# Patient Record
Sex: Female | Born: 1955 | Race: White | Hispanic: No | Marital: Married | Smoking: Never smoker
Health system: Southern US, Academic
[De-identification: ages and names within clinical notes are randomized; demographics above are authoritative.]

## PROBLEM LIST (undated history)

## (undated) DIAGNOSIS — N83209 Unspecified ovarian cyst, unspecified side: Secondary | ICD-10-CM

## (undated) DIAGNOSIS — K589 Irritable bowel syndrome without diarrhea: Secondary | ICD-10-CM

## (undated) DIAGNOSIS — F411 Generalized anxiety disorder: Secondary | ICD-10-CM

## (undated) DIAGNOSIS — G4700 Insomnia, unspecified: Secondary | ICD-10-CM

## (undated) DIAGNOSIS — F329 Major depressive disorder, single episode, unspecified: Secondary | ICD-10-CM

## (undated) DIAGNOSIS — I10 Essential (primary) hypertension: Secondary | ICD-10-CM

## (undated) DIAGNOSIS — R918 Other nonspecific abnormal finding of lung field: Secondary | ICD-10-CM

## (undated) DIAGNOSIS — K859 Acute pancreatitis without necrosis or infection, unspecified: Secondary | ICD-10-CM

## (undated) DIAGNOSIS — L570 Actinic keratosis: Secondary | ICD-10-CM

## (undated) DIAGNOSIS — E041 Nontoxic single thyroid nodule: Secondary | ICD-10-CM

## (undated) DIAGNOSIS — J8283 Eosinophilic asthma: Secondary | ICD-10-CM

## (undated) DIAGNOSIS — E782 Mixed hyperlipidemia: Secondary | ICD-10-CM

## (undated) DIAGNOSIS — G8929 Other chronic pain: Secondary | ICD-10-CM

## (undated) DIAGNOSIS — M797 Fibromyalgia: Secondary | ICD-10-CM

## (undated) DIAGNOSIS — C44622 Squamous cell carcinoma of skin of right upper limb, including shoulder: Secondary | ICD-10-CM

## (undated) DIAGNOSIS — I749 Embolism and thrombosis of unspecified artery: Secondary | ICD-10-CM

## (undated) DIAGNOSIS — D68318 Other hemorrhagic disorder due to intrinsic circulating anticoagulants, antibodies, or inhibitors: Secondary | ICD-10-CM

## (undated) DIAGNOSIS — G44309 Post-traumatic headache, unspecified, not intractable: Secondary | ICD-10-CM

## (undated) DIAGNOSIS — K55069 Acute infarction of intestine, part and extent unspecified: Secondary | ICD-10-CM

## (undated) DIAGNOSIS — E039 Hypothyroidism, unspecified: Secondary | ICD-10-CM

## (undated) DIAGNOSIS — M5020 Other cervical disc displacement, unspecified cervical region: Secondary | ICD-10-CM

## (undated) DIAGNOSIS — R109 Unspecified abdominal pain: Secondary | ICD-10-CM

## (undated) HISTORY — DX: Embolism and thrombosis of unspecified artery: I74.9

## (undated) HISTORY — DX: Unspecified abdominal pain: R10.9

## (undated) HISTORY — PX: SKIN LESION EXCISION: SHX2412

## (undated) HISTORY — DX: Fibromyalgia: M79.7

## (undated) HISTORY — PX: SQUAMOUS CELL CARCINOMA EXCISION: SHX2433

## (undated) HISTORY — DX: Other hemorrhagic disorder due to intrinsic circulating anticoagulants, antibodies, or inhibitors: D68.318

## (undated) HISTORY — DX: Post-traumatic headache, unspecified, not intractable: G44.309

## (undated) HISTORY — DX: Eosinophilic asthma: J82.83

## (undated) HISTORY — PX: HX GALL BLADDER SURGERY/CHOLE: SHX55

## (undated) HISTORY — DX: Insomnia, unspecified: G47.00

## (undated) HISTORY — DX: Squamous cell carcinoma of skin of right upper limb, including shoulder: C44.622

## (undated) HISTORY — DX: Other nonspecific abnormal finding of lung field: R91.8

## (undated) HISTORY — PX: SKIN GRAFT: SHX250

## (undated) HISTORY — DX: Irritable bowel syndrome, unspecified: K58.9

## (undated) HISTORY — PX: NECK SURGERY: SHX720

## (undated) HISTORY — PX: HX TUBAL LIGATION: SHX77

## (undated) HISTORY — PX: HX LASER SKIN LESION REMOVAL: 2100001165

## (undated) HISTORY — PX: BILATERAL OOPHORECTOMY: SHX1221

## (undated) HISTORY — DX: Generalized anxiety disorder: F41.1

## (undated) HISTORY — PX: HX THYROIDECTOMY: SHX17

## (undated) HISTORY — DX: Other cervical disc displacement, unspecified cervical region: M50.20

## (undated) HISTORY — DX: Hypothyroidism, unspecified: E03.9

## (undated) HISTORY — DX: Other chronic pain: G89.29

## (undated) HISTORY — DX: Nontoxic single thyroid nodule: E04.1

## (undated) HISTORY — DX: Unspecified ovarian cyst, unspecified side: N83.209

## (undated) HISTORY — DX: Actinic keratosis: L57.0

## (undated) HISTORY — DX: Essential (primary) hypertension: I10

## (undated) HISTORY — DX: Major depressive disorder, single episode, unspecified: F32.9

## (undated) HISTORY — DX: Acute infarction of intestine, part and extent unspecified: K55.069

## (undated) HISTORY — DX: Acute pancreatitis without necrosis or infection, unspecified: K85.90

## (undated) HISTORY — DX: Mixed hyperlipidemia: E78.2

---

## 1996-02-13 ENCOUNTER — Other Ambulatory Visit (HOSPITAL_COMMUNITY): Payer: Self-pay

## 2015-09-23 HISTORY — PX: COLONOSCOPY: SHX174

## 2019-10-11 IMAGING — CR XRAY CERVICAL SPINE MINIMUM 4 VIEWS
1 series · 5 of 5 positions shown · non-contrast
Comparison: None available.

EXAM:  XRAY CERVICAL SPINE MINIMUM 4 VIEWS
INDICATION: Neck pain, fall.

[Series 1: view not recorded · 0.17mm/px · 5 of 5 slices shown]
[im 1/5]
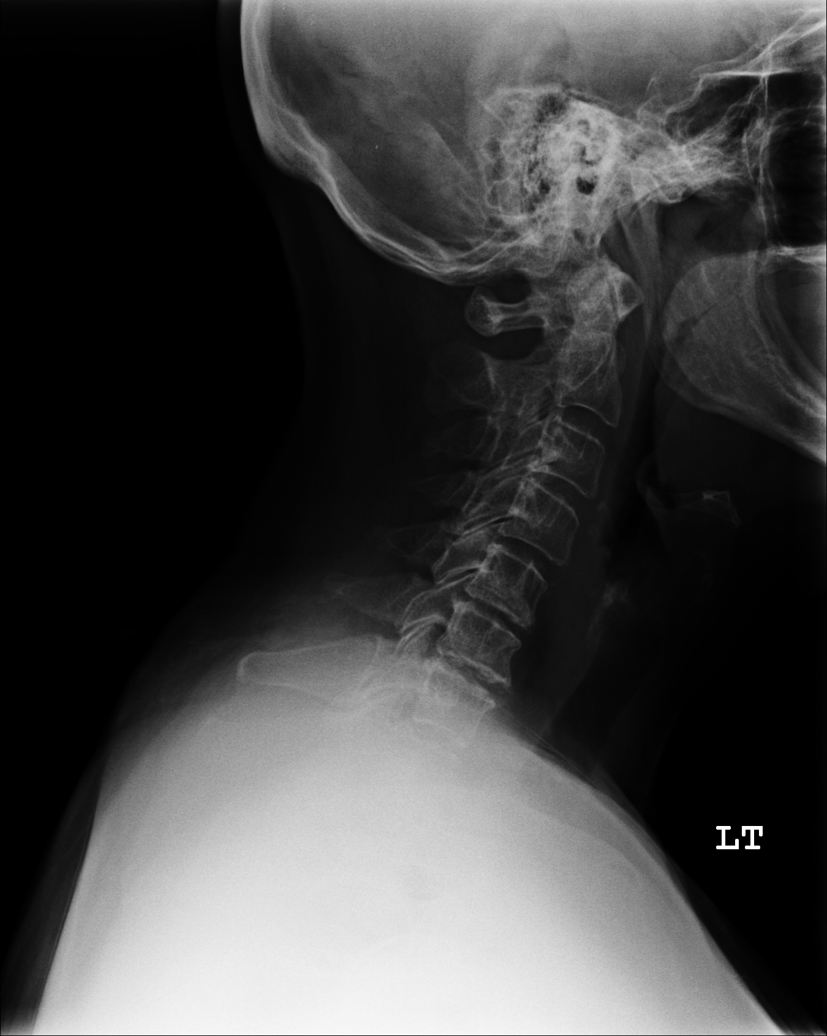
[im 2/5]
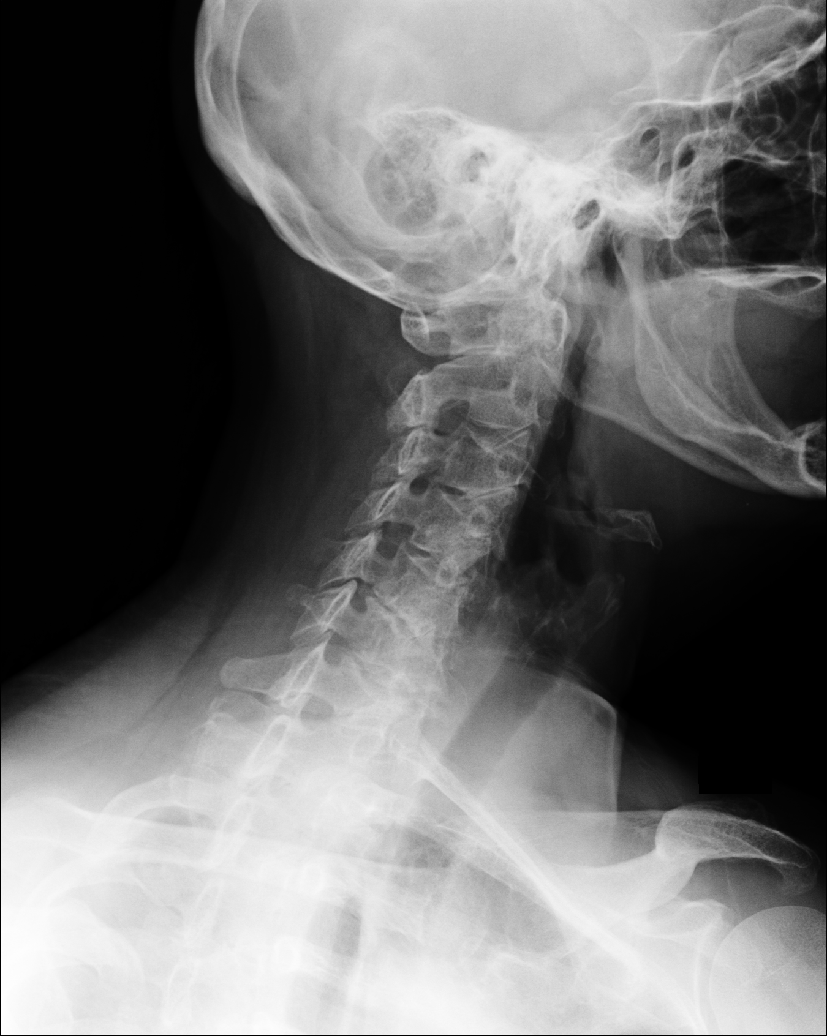
[im 3/5]
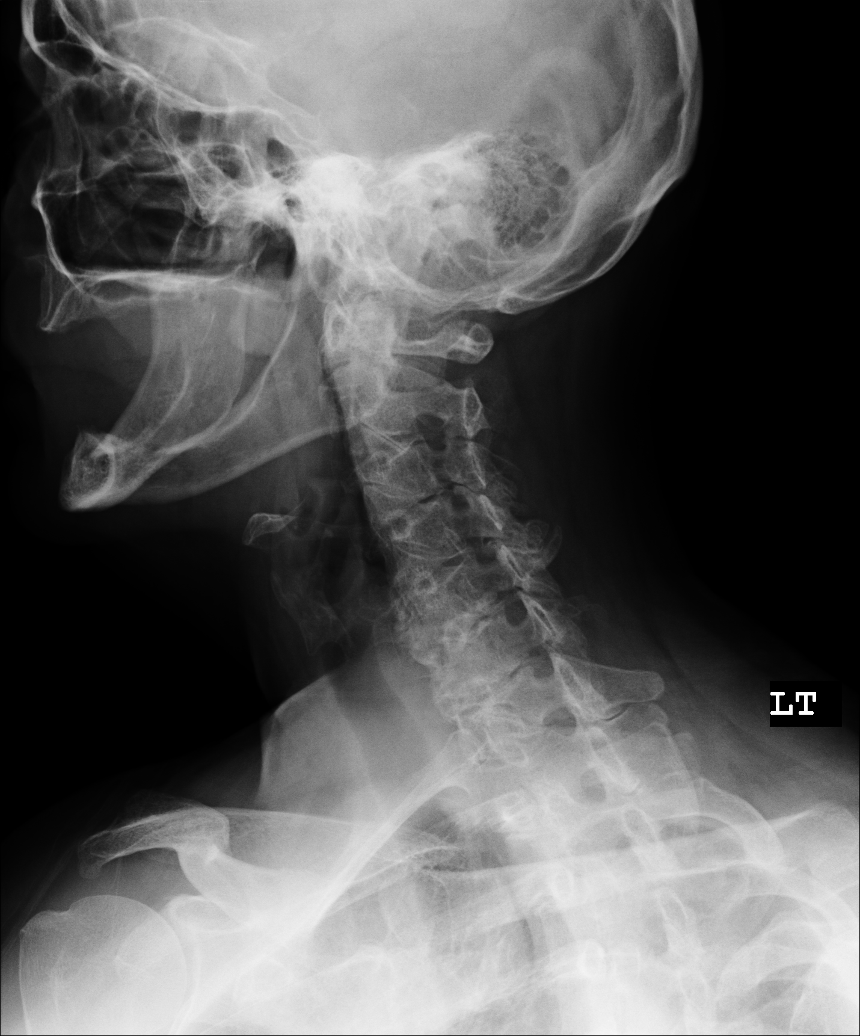
[im 4/5]
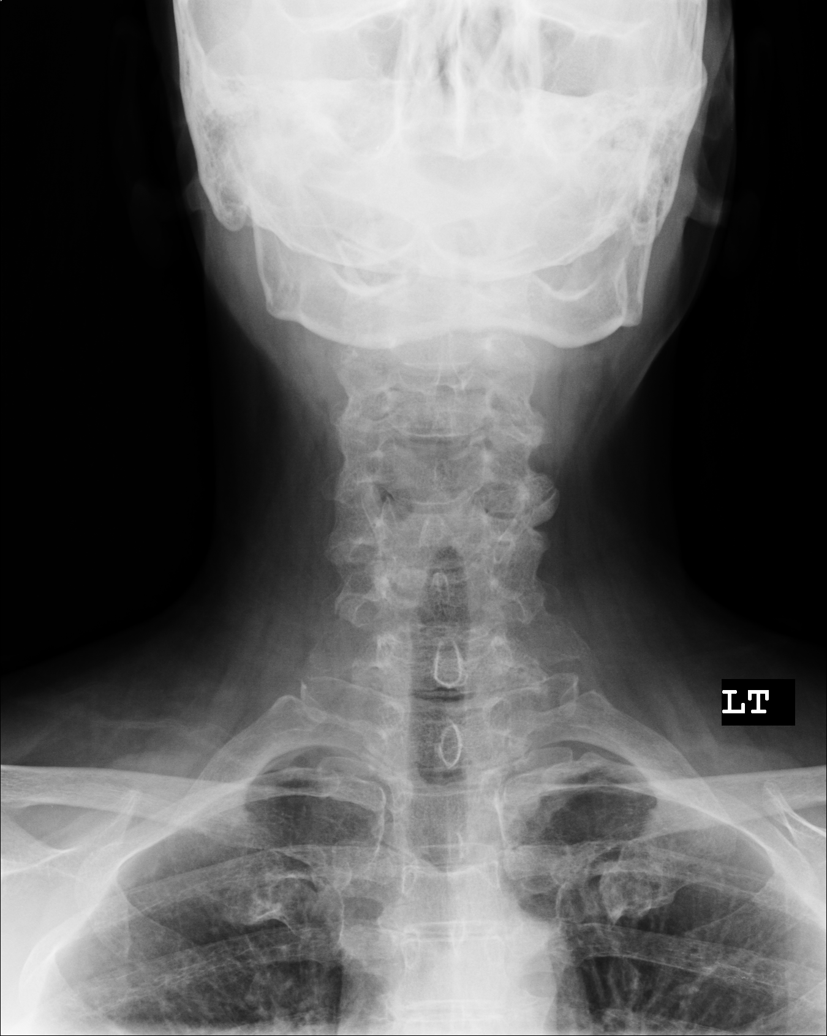
[im 5/5]
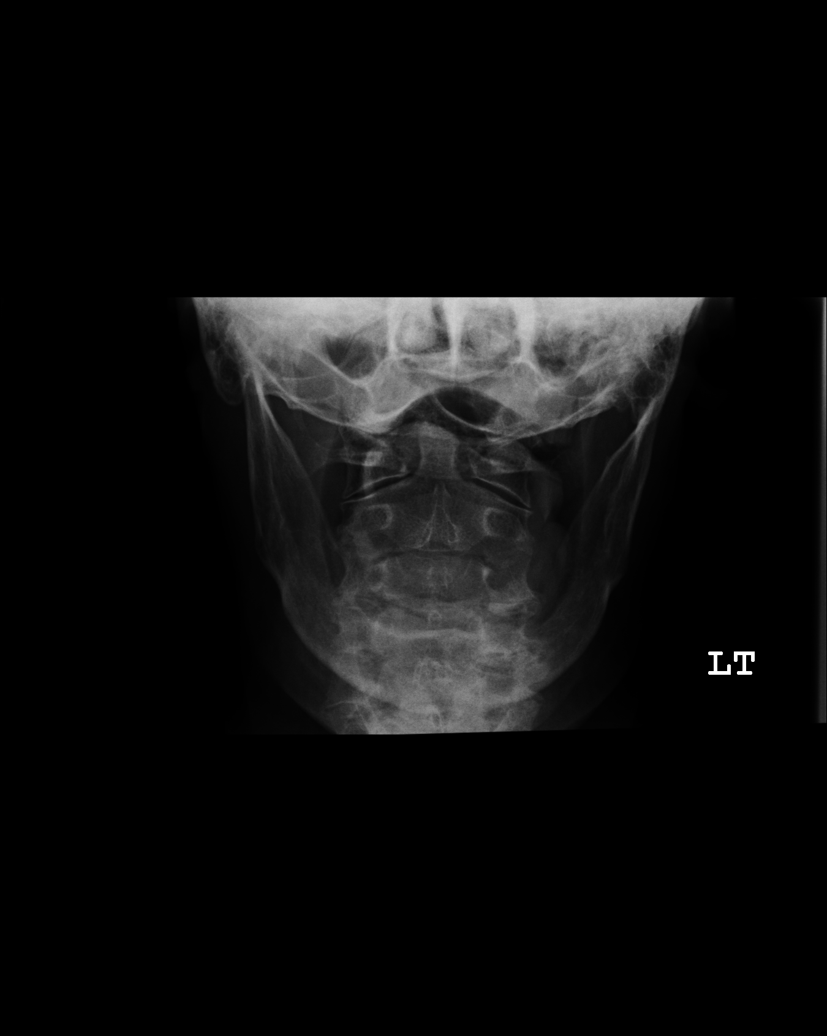

[5 of 5 positions shown; findings below may reference images not displayed]

FINDINGS: Vertebral bodies are normal in height and alignment. There is no acute fracture or subluxation. There is mild degenerative disc disease at C6-7 level. Mild-to-moderate left neural foraminal stenosis is noted at C3-4 and C4-5 levels from facet and uncovertebral joint hypertrophy. Atlantoaxial articulation is well maintained. There is no prevertebral soft tissue swelling.
IMPRESSION: Multilevel degenerative changes as detailed above.

## 2019-10-11 IMAGING — CT CT HEAD/BRAIN W/O DYE
2 series · 16 of 30 positions shown, 20 images · non-contrast
Comparison: None available.

EXAM:  CT HEAD/BRAIN W/O DYE
INDICATION: Headaches and dizziness, recent fall.
TECHNIQUE: Axial noncontrast CT imaging was performed from skull base to the vertex. Exam was performed using 1 or more of the following dose reduction techniques: Automated exposure control, adjustment of the mA and/or kV according to patient size, or the use of iterative reconstruction technique.

[ax · axial · 0.40mm/px · z∈[-406,-272]mm · 13 of 79 slices shown, 17 images]
[im 6/79  brain]
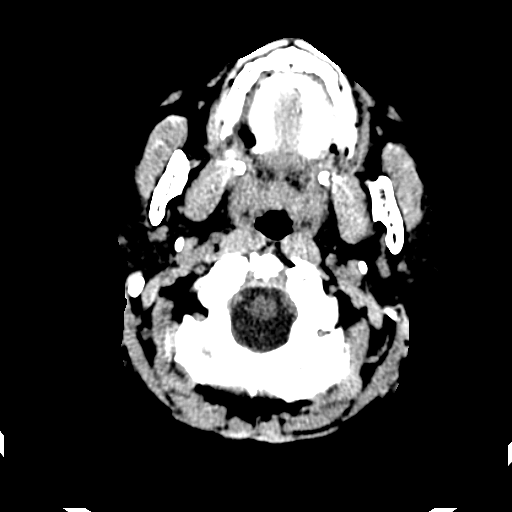
[im 6/79  bone]
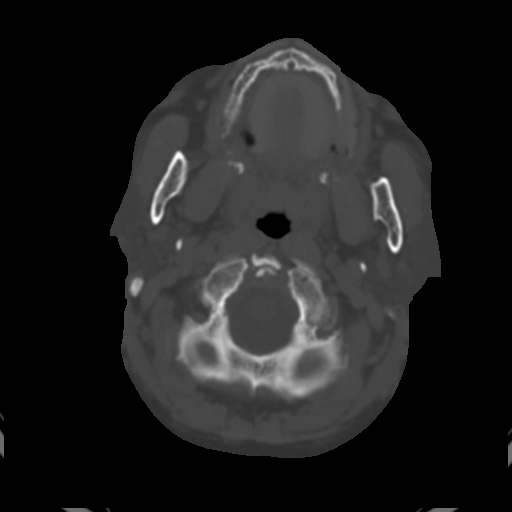
[im 12/79  brain]
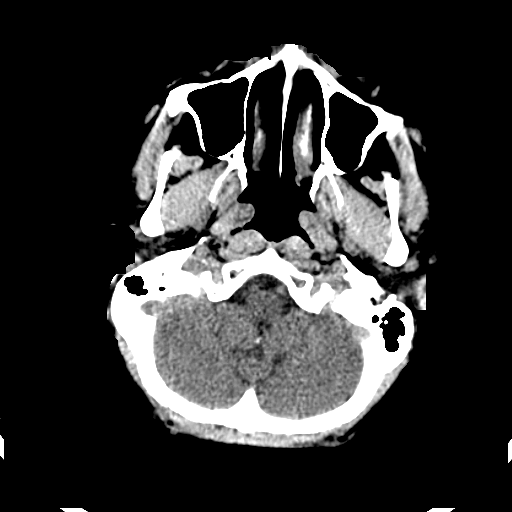
[im 17/79  brain]
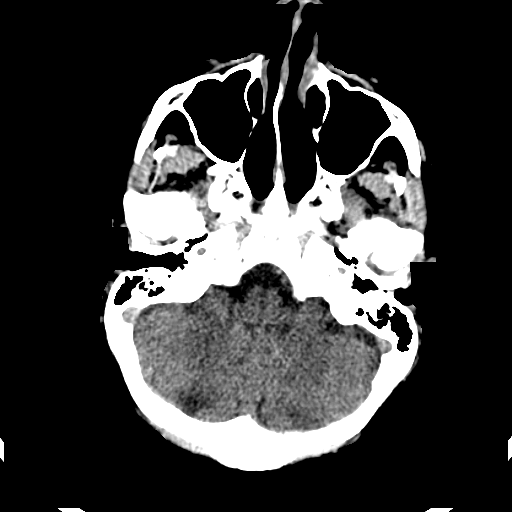
[im 23/79  brain]
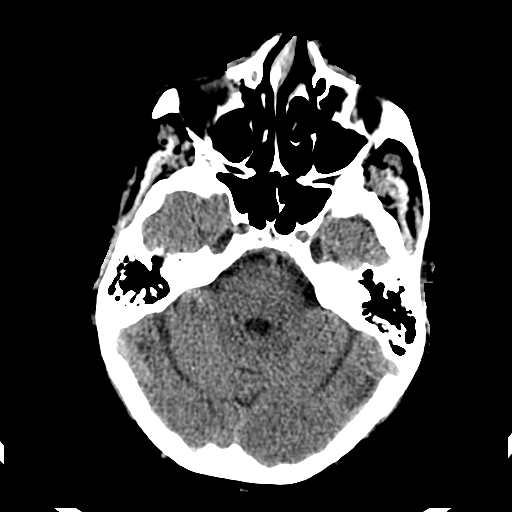
[im 28/79  brain]
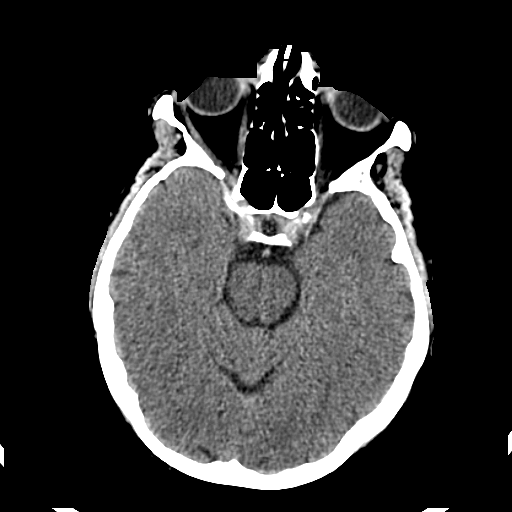
[im 28/79  bone]
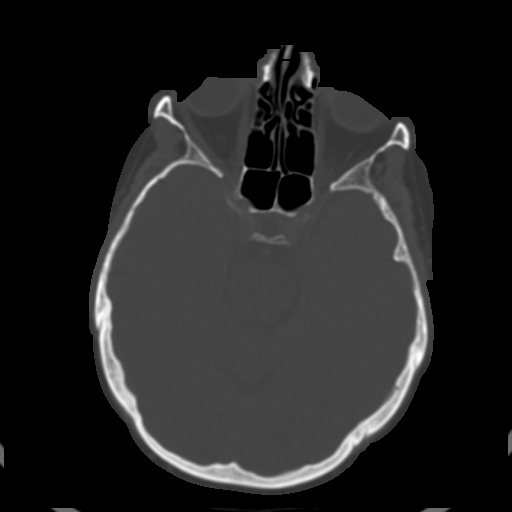
[im 34/79  brain]
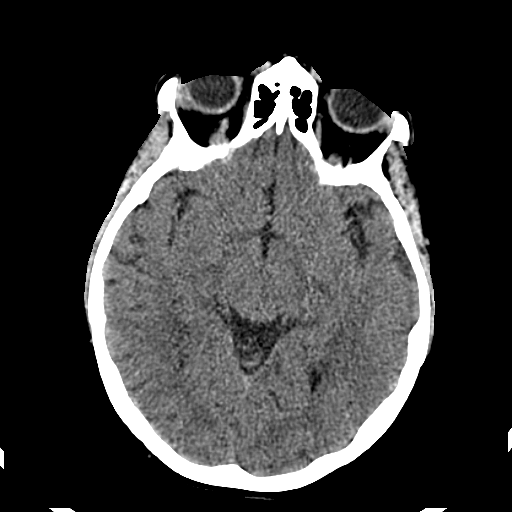
[im 40/79  brain]
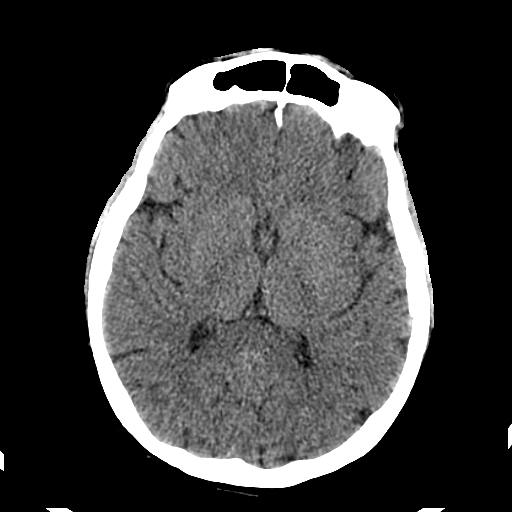
[im 45/79  brain]
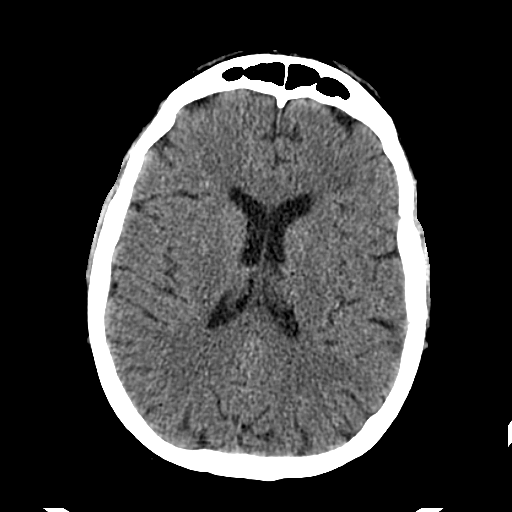
[im 51/79  brain]
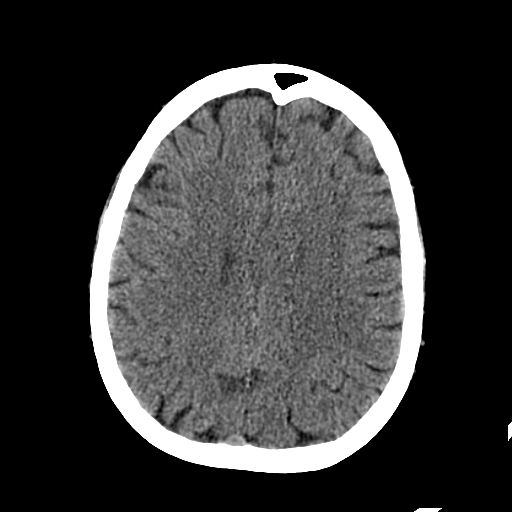
[im 51/79  bone]
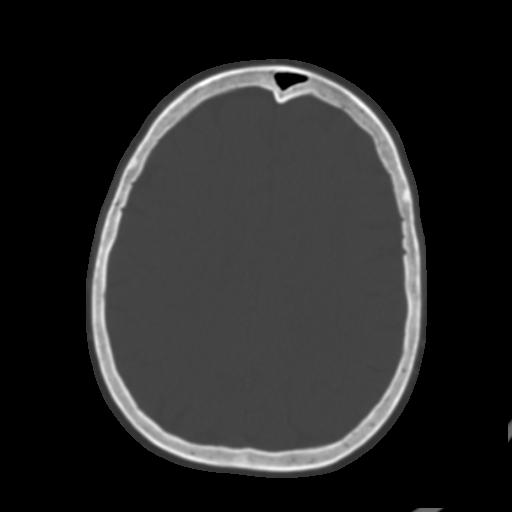
[im 56/79  brain]
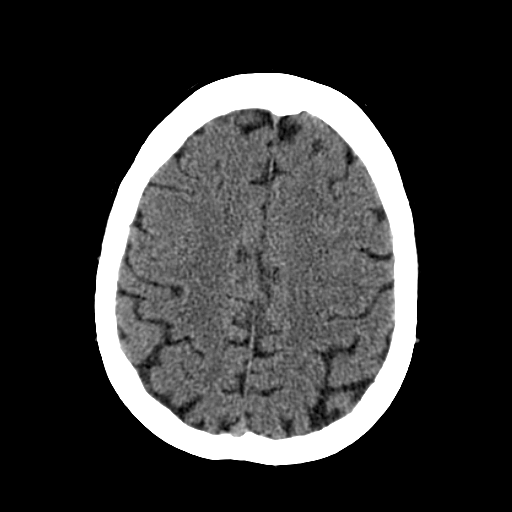
[im 62/79  brain]
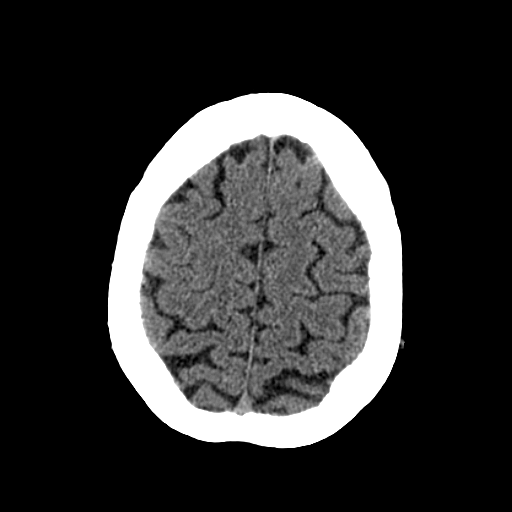
[im 67/79  brain]
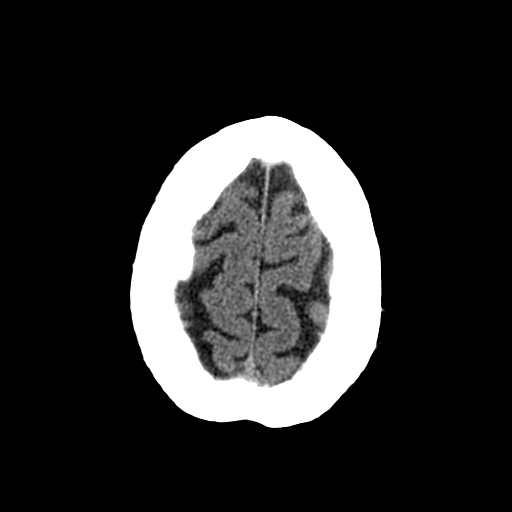
[im 73/79  brain]
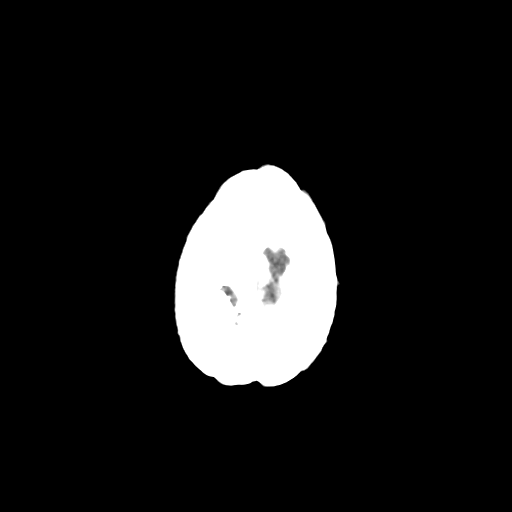
[im 73/79  bone]
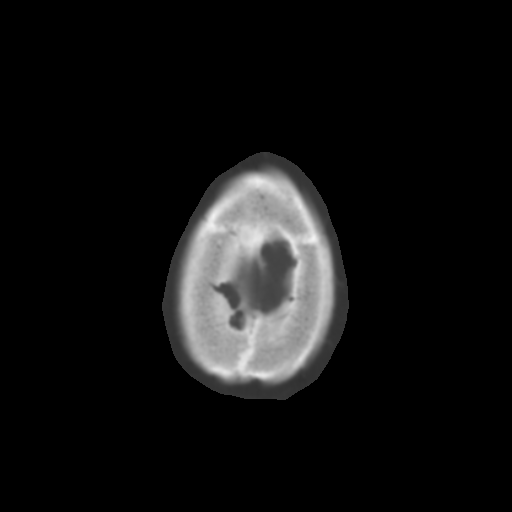

[ax bone · axial · 0.43mm/px · z∈[-400,-358]mm · 3 of 74 slices shown]
[im 6/74  bone]
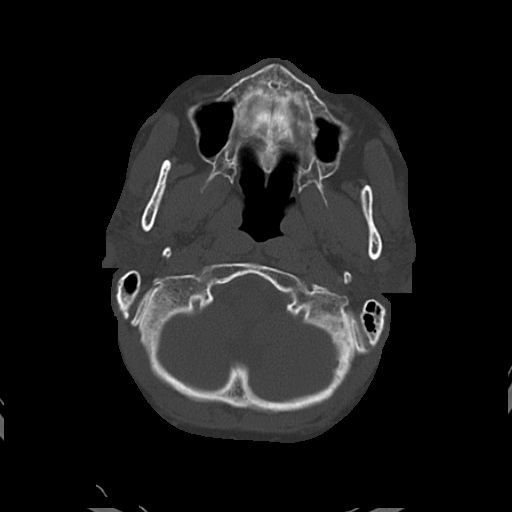
[im 16/74  bone]
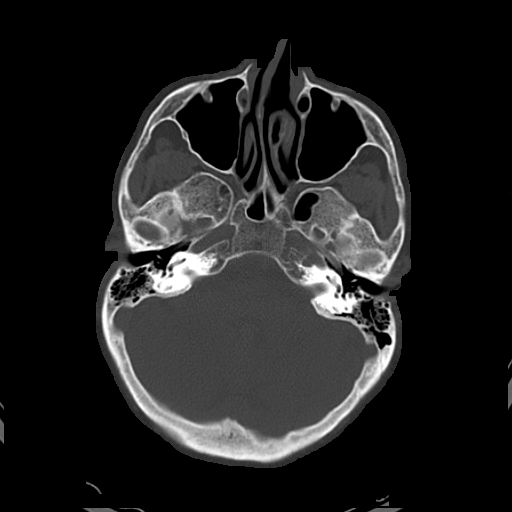
[im 27/74  bone]
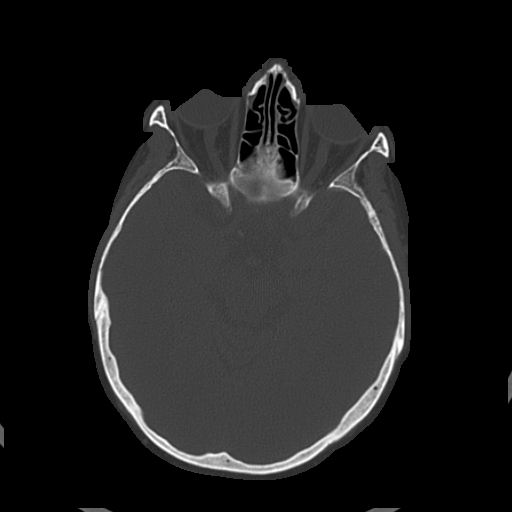

[16 of 30 positions shown; findings below may reference images not displayed]

FINDINGS: The cortical sulci and ventricular system is appropriate in size for the patient's age. There are no focal areas of abnormal attenuation within the brain parenchyma. There is no mass effect, midline shift or intracranial hemorrhage. There is no evidence of acute infarction. There are no extra-axial fluid collections. Visualized paranasal sinuses, mastoid air cells and orbital contents are also unremarkable.
IMPRESSION: Unremarkable exam.

## 2021-02-20 IMAGING — MG 3D SCREENING MAMMO BIL W/CAD & TOMO
5 series · 8 of 24 positions shown · non-contrast
Comparison: 01/05/2021

------------- REPORT [id number] -------------
﻿

MCELRATH, KADE
EXAM:  3D BILATERAL ANNUAL SCREENING DIGITAL MAMMOGRAM WITH CAD AND TOMOSYNTHESIS
INDICATION: Screening.

[R]
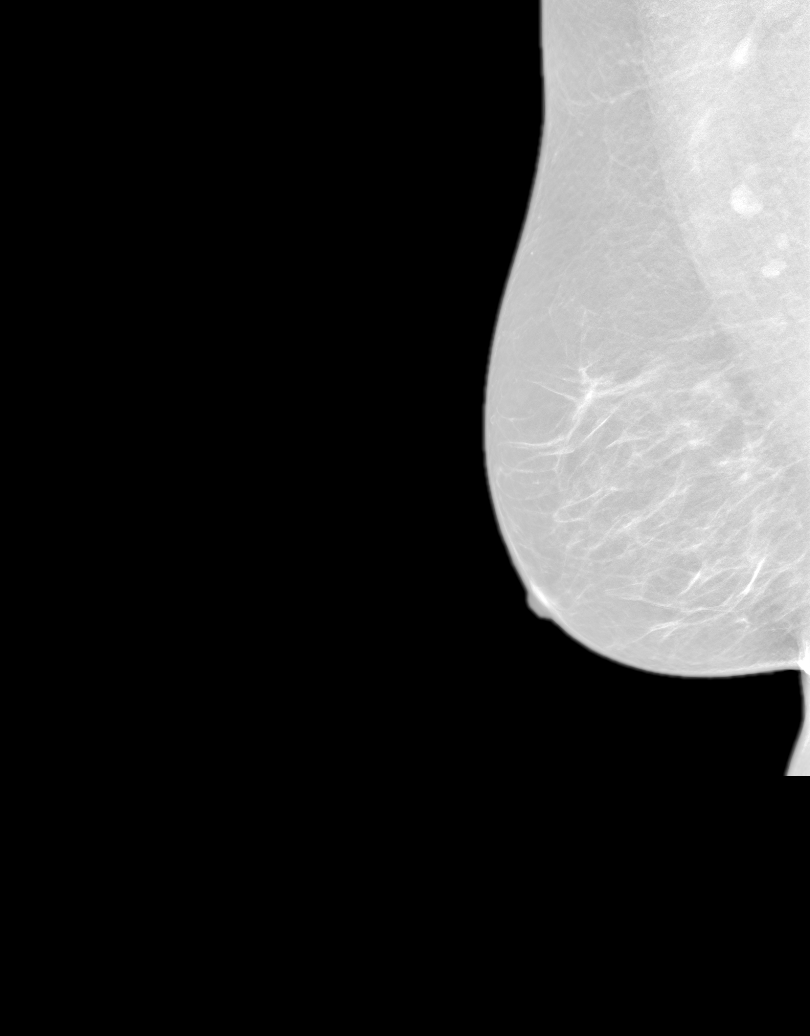

[L]
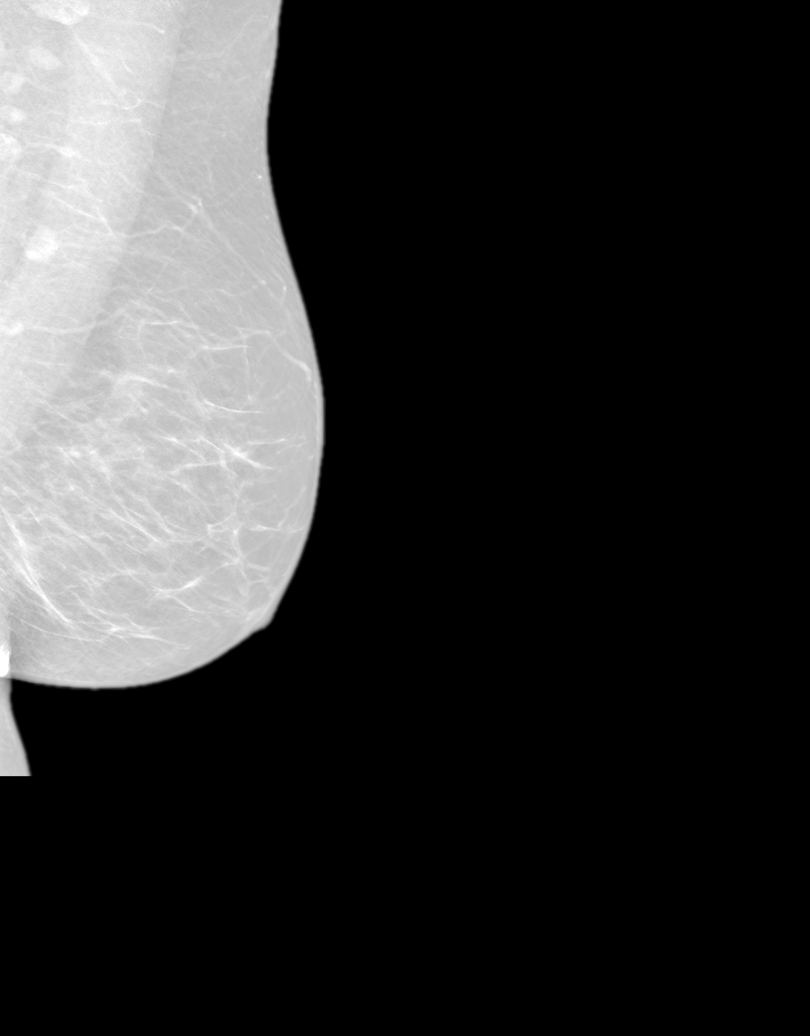

[R CC tomo · right · 0.10mm/px · 2 of 3 slices shown]
[im 1/3]
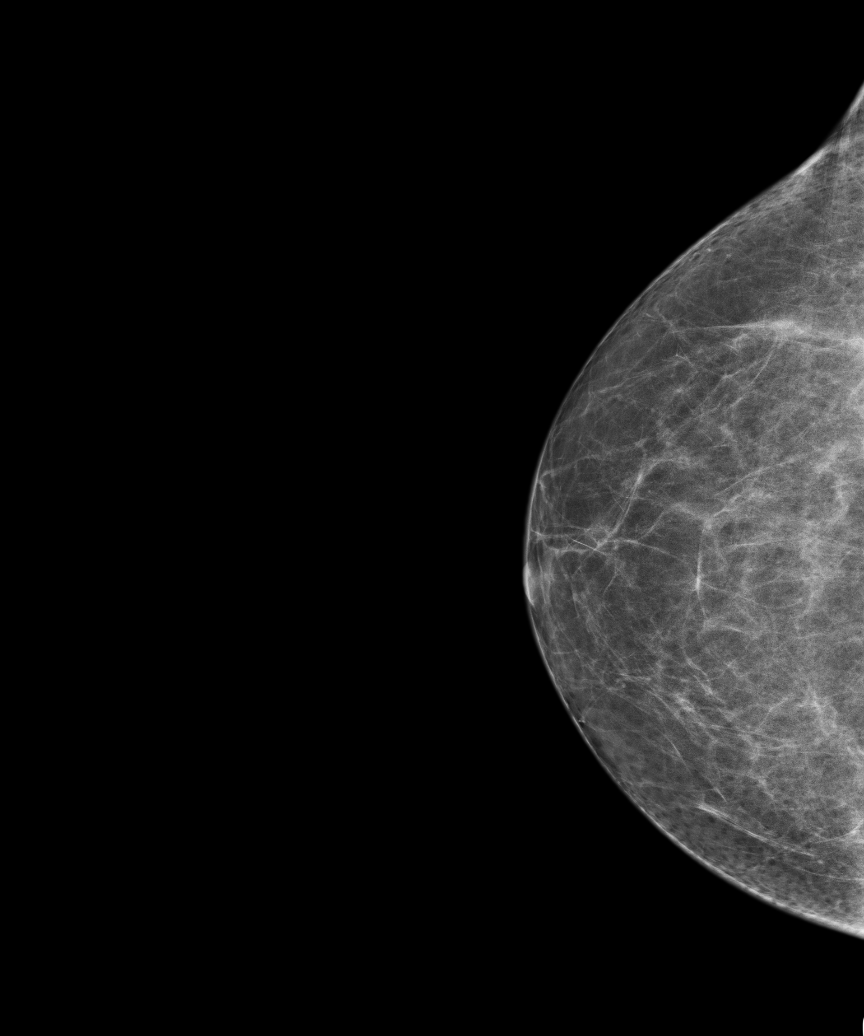
[im 3/3]
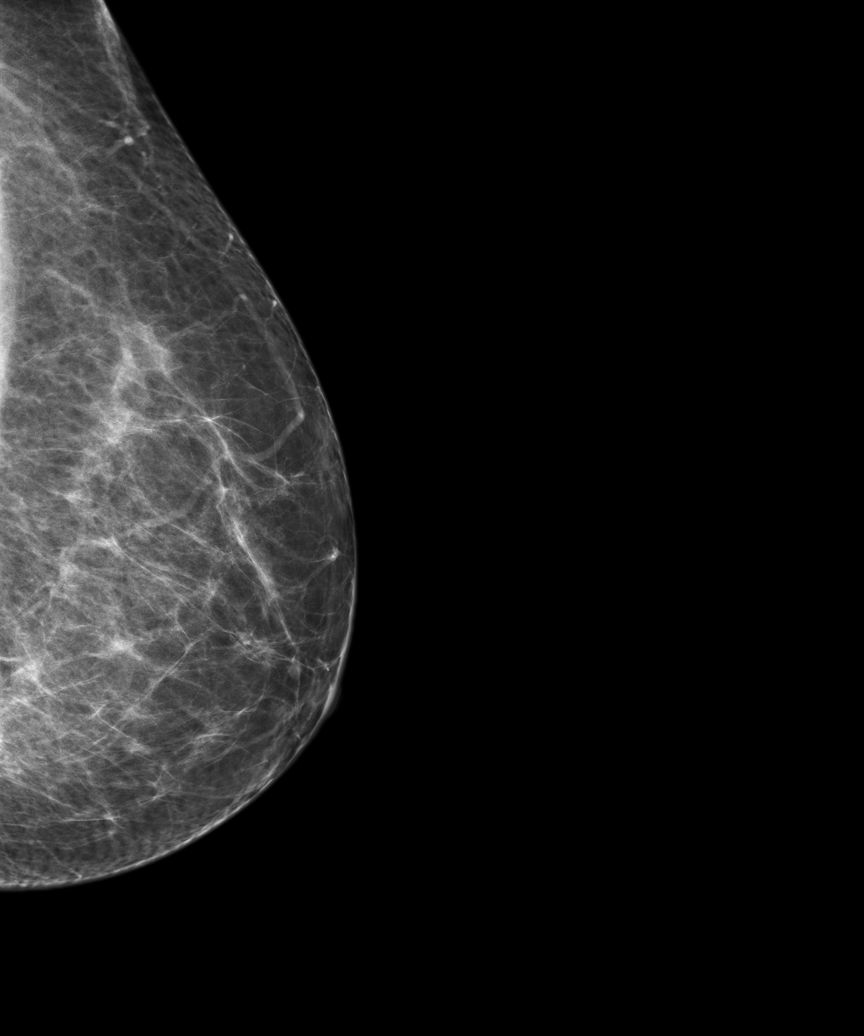

[3D SCREENING MAMMO BIL W/CAD & TOMO tomo · 2 acquisitions, 3 frames shown (1 of 2)]
[im 1/2]
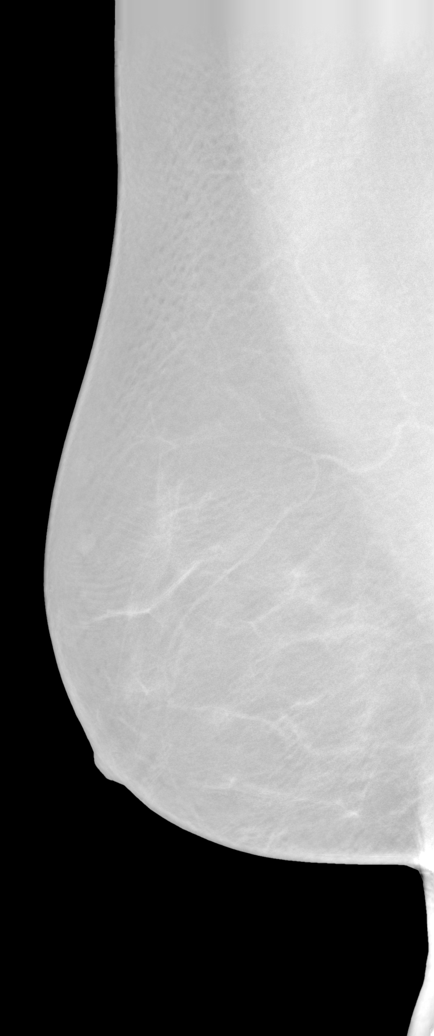
[im 2/2]
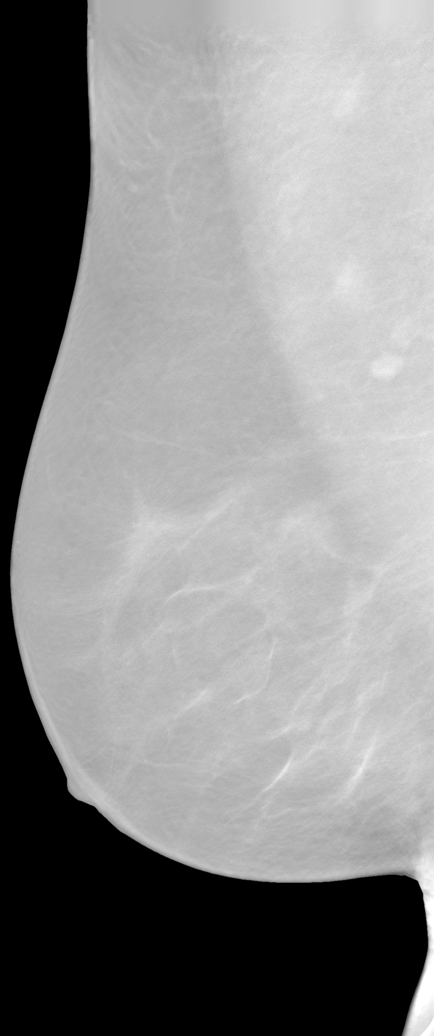
[im 2/2]
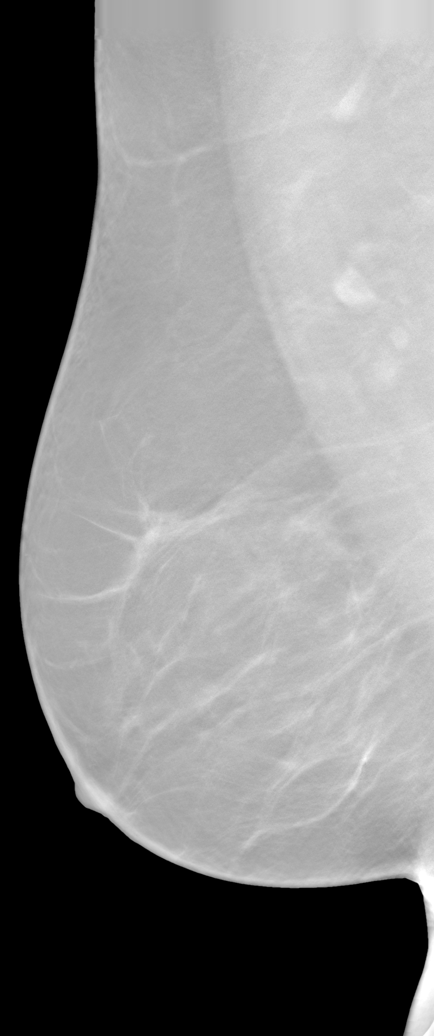

[3D SCREENING MAMMO BIL W/CAD & TOMO tomo (2 of 2) · tomo slice 13/79.0]
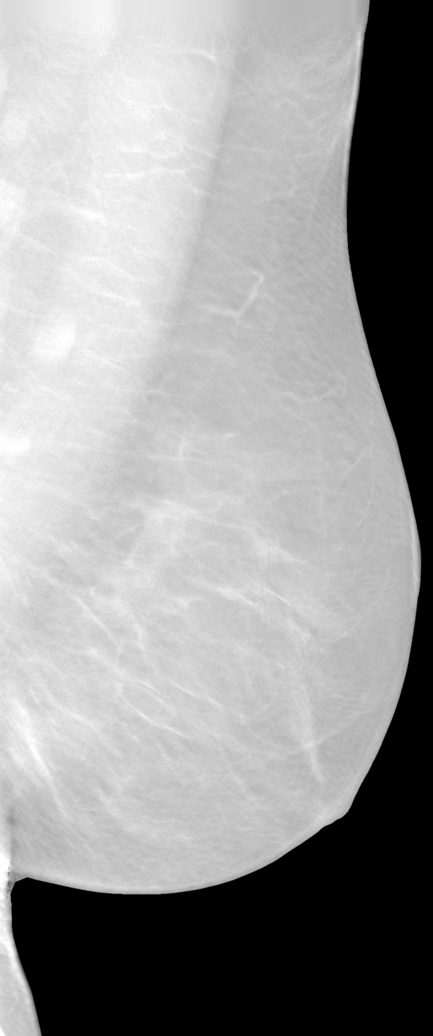

[8 of 24 positions shown; findings below may reference images not displayed]

FINDINGS: There are scattered fibroglandular elements.  There is no mass or suspicious cluster of microcalcifications.   There is no architectural distortion, skin thickening or nipple retraction.
IMPRESSION: 1.  BIRADS 2-Benign findings. Patient has been added in a reminder system with a target date for the next screening mammography.

2.  DENSITY CODE – B (Scattered areas of fibroglandular density). 

Final Assessment Code:

Bi-Rads 2 

BI-RADS 0
 Need additional imaging evaluation.

BI-RADS 1
 Negative mammogram.

BI-RADS 2
 Benign finding.

BI-RADS 3
 Probably benign finding; short-interval follow-up suggested.

BI-RADS 4
 Suspicious abnormality; biopsy should be considered.

BI-RADS 5
 Highly suggestive of malignancy; appropriate action should be taken.

BI-RADS 6
 Known biopsy-proven malignancy; appropriate action should be taken.

NOTE:
In compliance with Federal regulations, the results of this mammogram are being sent to the patient.

------------- REPORT [id number] -------------
[facility]
[address]
Tiger Ms.TIGER, SHARITA:
We wish to report the following on your recent mammography examination. We are sending a report to your referring physician or other health care provider. 
(       Normal/Negative:
No evidence of cancer.
This statement is mandated by the [place], Department of Health.
Your examination was performed by one of our technologists, who are registered radiological technologists and also specially certified in mammography:
___
Markland, Marjuan (M)

Your mammogram was interpreted by our radiologist.

( 
Collette Sedman, M.D.

(Annual Breast Examination by a physician or other health care provider
(Annual Mammography Screening beginning at age 40
(Monthly Breast Self Examination

## 2021-11-08 ENCOUNTER — Encounter (RURAL_HEALTH_CENTER): Payer: Self-pay | Admitting: Internal Medicine

## 2021-11-08 DIAGNOSIS — R918 Other nonspecific abnormal finding of lung field: Secondary | ICD-10-CM

## 2021-11-08 DIAGNOSIS — F411 Generalized anxiety disorder: Secondary | ICD-10-CM

## 2021-11-08 DIAGNOSIS — M797 Fibromyalgia: Secondary | ICD-10-CM | POA: Insufficient documentation

## 2021-11-08 DIAGNOSIS — M5020 Other cervical disc displacement, unspecified cervical region: Secondary | ICD-10-CM

## 2021-11-08 DIAGNOSIS — E782 Mixed hyperlipidemia: Secondary | ICD-10-CM

## 2021-11-08 DIAGNOSIS — E041 Nontoxic single thyroid nodule: Secondary | ICD-10-CM

## 2021-11-08 DIAGNOSIS — G8929 Other chronic pain: Secondary | ICD-10-CM

## 2021-11-08 DIAGNOSIS — F329 Major depressive disorder, single episode, unspecified: Secondary | ICD-10-CM

## 2021-11-08 DIAGNOSIS — G44309 Post-traumatic headache, unspecified, not intractable: Secondary | ICD-10-CM

## 2021-11-08 DIAGNOSIS — I749 Embolism and thrombosis of unspecified artery: Secondary | ICD-10-CM

## 2021-11-08 DIAGNOSIS — K55069 Acute infarction of intestine, part and extent unspecified: Secondary | ICD-10-CM | POA: Insufficient documentation

## 2021-11-08 DIAGNOSIS — N83209 Unspecified ovarian cyst, unspecified side: Secondary | ICD-10-CM

## 2021-11-08 DIAGNOSIS — C44622 Squamous cell carcinoma of skin of right upper limb, including shoulder: Secondary | ICD-10-CM

## 2021-11-08 DIAGNOSIS — G4700 Insomnia, unspecified: Secondary | ICD-10-CM

## 2021-11-08 DIAGNOSIS — L570 Actinic keratosis: Secondary | ICD-10-CM

## 2021-11-08 DIAGNOSIS — E039 Hypothyroidism, unspecified: Secondary | ICD-10-CM

## 2021-11-08 DIAGNOSIS — J8283 Eosinophilic asthma: Secondary | ICD-10-CM

## 2021-11-08 DIAGNOSIS — K589 Irritable bowel syndrome without diarrhea: Secondary | ICD-10-CM

## 2021-11-08 DIAGNOSIS — I10 Essential (primary) hypertension: Secondary | ICD-10-CM

## 2021-11-08 NOTE — Progress Notes (Signed)
[facility]  Patient:  Jo, Wong #: [id number]  Date of Service: 08/07/21 MR #: [id number]  Provider: Rico Ala P.A.C. PCP: Rico Ala P.A.C.  DOB: [birth year] Age/Sex: 65/F Referring Provider:     Internal Medicine VisitSignedHPI  Chronic Disease Management-AMB  Follow-up visit    FU  SQ CELL CA  Right shoulder   sq  cell cancer removed      08/2020  and  to fu wtih  Mali  Johnston   Jan  was her appt and   Jan  16th  area   got  big and   grew   fast          fast growing   6 weeks  Jan   20th   sore and  painful  and   Started on ab  x  20 days  than  bx    + sq  cell  Mohs   surgery  scheduled and  concerned  with lymph  nodes  [name]      Feb   28th  surgery lg area removed after nl [place]  to make sure lymph nodes were not involved     and  pt has  wound vac      March  2nd  Pt since has had  skin graft taken from  upper  right thigh and  graft has taken to right upper  arm     Newest area on face sq cell right cheek   Oct   effudex   bid   x  28  days    and  staph infection  by looking  by derm and mupericon        getting better now   pt has large area  on face   crusted over   ( Started Oct  19th  and fu on  Nov  11th)      S/P BASAL CELL OFF ABD AND  NO OTHER  TREATMENT  2022     Hx  of Pneumona Dec  2021      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  on symbicort but still not great  will add albuterol  until she fu    meds discussed     ie xolair  or nucala  to discuss with [name]  when she has fu      On Trelegy     On fasenra    FU OSA  on cpap    with [name]   Nov  2022   pt still not sleeping well  to fu    Hx [name]    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     2020,2021, 2022   removed       Follow-up hypertension blood pressures been stable    Follow-up B12 deficiency patient is on over-the-counter B12    Follow-up hypothyroidism patient is on levothyroxine 50 mcg 1/day    Follow-up neuropathy  and fibromyalgia patient is on gabapentin 600 mg twice daily along with Savella    gabapentin  level has been don  7/21    Follow-up GERD patient is on Protonix 40 mg daily without breakthrough symptoms    Follow-up hyperlipidemia patient is on pravastatin 20 mg daily along with omega-3    Probable vitamin D patient is on supplement    Depression patient takes Wellbutrin 300 daily  FU IBS- C  bm currently stable        Intake  Vital Signs      08/07/21  08:28  Height   5 ft 6 in  Weight   171 lb  BMI   27.6  BP   132/82  Blood Pressure Location   Lt brachial  Position   Sitting  Pulse   95 H  Pulse Source   Monitor  Temp   98.0 F  Temp Source   Tympanic  Pulse Oximetry (%)   98  Oxygen Delivery Method   room air    Chief Complaint:  cdm    Add today's problem/HPI: CDM Chronic Disease Management  Allergies    Allergy/AdvReac Type Severity Reaction Status Date / Time  duloxetine [From Cymbalta] AdvReac Mild did not Verified 10/04/19 15:54        work          Medication List     Medication  Instructions  Recorded  Confirmed  cholecalciferol (vitamin D3) 50 50 mcg PO QDAY 03/16/19 08/07/21  mcg (2,000 unit) capsule (D3-2000)        mecobalamin (vitamin B12) 1,000 1,000 mcg PO QDAY #30 tab 03/16/19 08/07/21  mcg chewable tablet        omega-3 acid ethyl esters 1 gram 1 cap PO QDAY 03/16/19 08/07/21  capsule        montelukast 10 mg tablet 10 mg PO QDAY #90 tab 02/04/21 08/07/21  pravastatin 20 mg tablet See Rx Instructions .ROUTE 02/05/21 08/07/21    .COMPLEX #90 tab      albuterol sulfate 90 mcg/actuation 2 inh INHALATION QID PRN #8.5 g 04/09/21 08/07/21  aerosol inhaler        gabapentin 600 mg tablet 600 mg PO BID #180 tab 04/09/21 08/07/21  milnacipran 100 mg tablet (Savella) 100 mg PO BID #180 tab 04/09/21 08/07/21  levothyroxine 50 mcg tablet See Rx Instructions .ROUTE 05/08/21 08/07/21    .COMPLEX #90 tab      pantoprazole 40 mg tablet,delayed See Rx Instructions .ROUTE 06/03/21 08/07/21  release .COMPLEX #90  tab      bupropion HCl 300 mg 24 hr tablet, See Rx Instructions .ROUTE 06/13/21 08/07/21  extended release .COMPLEX #90 tab      losartan 50 mg tablet See Rx Instructions .ROUTE 08/05/21 08/07/21    .COMPLEX #90 tab      benralizumab 30 mg/mL subcutaneous 30 mg SUBCUT Q8W #1 ml 08/07/21 08/07/21  auto-injector (Fasenra Pen)        budesonide-formoterol HFA 160 1 inh INHALATION BID #10.2 g 08/07/21 08/07/21  mcg-4.5 mcg/actuation aerosol        inhaler (Symbicort)        fluorouracil 5 % topical cream 1 applic TOPICAL BID 16/10/96 08/07/21  mupirocin 2 % topical ointment 1 applic TOPICAL BID  g 04/54/09 08/07/21  nitroglycerin 0.4 mg sublingual 0.4 mg SL Q5-76M PRN #25 tab 08/07/21 08/07/21  tablet            Patient bottles, verbal confirmation, RX history and old medical records are used to get the best medication list possible.: Provider reconciled      Patient Problem List/History  Active Problem List (Updated 02/04/21 @ 10:08 by Rico Ala, P.A.C.)    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  Headache, post-traumatic (Acute) G44.309  Neck Pain (Acute) M54.2  Headache (Acute) R51.9  Thyroid nodule E04.1  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  Thromboembolic disorder S17.7  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  Multiple nodules of lung R91.8  Multiple joint pain M25.50  IBS (irritable bowel syndrome) K58.9  Intervertebral disc prolapse  Insomnia G47.00  Acquired hypothyroidism E03.9  Mixed hyperlipidemia E78.2  Headache R51  Fibromyositis 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  Benign essential hypertension I10  Major depressive disorder, single episode F32.9  Cyst of ovary N83.209  Chronic pain G89.29  Generalized anxiety disorder F41.1  Actinic keratosis L57.0      Past Medical History (Updated 02/04/21 @ 10:08 by Rico Ala, P.A.C.)    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  Pancreatitis K85.90  Stomach cramps R10.9    Surgical History (Updated  04/11/21 @ 10:18 by Leigh Aurora, CMA)    History of bilateral oophorectomy Z90.722  History of cholecystectomy Z90.49  History of colonoscopy Z98.890  Dr Vedia Pereyra 2017 due 2027  History of removal of pigmented skin lesion Z98.890, Z87.2  History of skin graft Z94.5  12/2020  right upper arm  History of squamous cell carcinoma excision Z98.890, Z85.9  10/2019  left shoulder and right front shoulder  03/2021  back right  History of thyroidectomy Z90.09  History of tubal ligation Z98.51  Skin lesion L98.9  [place]  [facility]    2020 2021  2  scc   1  basal  chest neck shoulder  derm one  scc and  basal  basal posterior  left  shoulder      Family History (Reviewed 09/20/20 @ 12:37 by Leigh Aurora, CMA)  SISTER  Malignant neoplastic disease       breast  MOTHER  Heart disease  Cerebrovascular accident  Disorder of cardiovascular system       blood clot in throat after a fall  HTN (hypertension)  BROTHER  Bleeding ulcer  SISTER  Malignant neoplastic disease       breast  DVT of deep femoral vein  FATHER   Deceased,  coal miner accident pt was 2 months old at time     No problems noted.    BROTHER  Aneurysm  MATERNAL AUNT  Aneurysm    Social History (Reviewed 09/20/20 @ 12:37 by Leigh Aurora, CMA)  Tobacco Status:  none  substance use/type:  does not use  alcohol use/freq:  never  housing:  house  household members:  other details: widowed  current occupational status:  employed  history of abuse:  none reported  current abuse:  none reported      ROS  Const  Denies body aches, Denies chills, Denies fatigue, Denies fever(s) and Denies headache(s)  Eyes  Denies change in vision and Denies diplopia  ENT  Reports system reviewed and no additional complaints, except as documented, Denies dysphagia and Denies headache(s)  Card  Denies chest pain, Denies leg edema, Denies lightheadedness, Denies dyspnea, Denies dyspnea on exertion and Denies orthopnea  Resp  Denies cough, Denies excessive phlegm production, Denies dyspnea, Denies  dyspnea on exertion and Denies wheezing  GI  Denies abdominal pain, Denies change in bowel habits, Denies constipation, Denies dysphagia, Denies heartburn, Denies diarrhea, Denies nausea and Denies vomiting  Musc  Reports as per HPI, Denies back pain, Denies myalgias, Denies arthralgias, Denies joint swelling and Denies stiffness  Neuro  Reports system reviewed and no additional complaints, except as documented and Denies headache(s)  Psych  Denies abnormal sleep pattern, Denies anxiety and Denies depression  Endo  Denies fatigue  Aller/Immun  Denies wheezing      Exam-AMB  Const  General:  cooperative, healthy appearing and no acute distress  Orientation/Consciousness: patient oriented x3  HENMT  Ears: hearing grossly normal bilaterally  Eyes  Conjunctivae: conjunctivae normal  Sclera: sclerae normal  Resp  Auscultation: clear to auscultation bilaterally, no rales, no rhonchi and no wheezes  Cardio  Rate: regular rate  Rhythm: regular rhythm  Heart Sounds: S1 normal and S2 normal  Neuro  General: patient oriented x3  Extrem  General: normal to inspection        A/P  Assessment & Plan  (1) Benign essential hypertension:        Status: None        Code(s):  I10 - Essential (primary) hypertension  (2) Thyroid nodule:        Status: None        Code(s):  E04.1 - Nontoxic single thyroid nodule  (3) Acquired hypothyroidism:        Status: None        Code(s):  E03.9 - Hypothyroidism, unspecified  (4) 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:        Status: Acute        Plan:  s/p removal   Feb  25th     wound vac   April 18th  skin graft  to  right arm and healing  well  skin graft host  was  right  thigh            Code(s):  C44.622 - Squamous cell carcinoma of skin of right upper limb, including shoulder  (5) Generalized anxiety disorder:        Status: None        Code(s):  F41.1 - Generalized anxiety disorder  (6) Fibromyositis:        Status: None        Code(s):  M79.7 - Fibromyalgia  (7) Multiple nodules of  lung:        Status: None        Code(s):  R91.8 - Other nonspecific abnormal finding of lung field  (8) Eosinophilic asthma:        Status: Acute        Plan:  pt was started on fasenra by [name]        Code(s):  C16.60 - Eosinophilic asthma    Plan  Meds reviewed as well as labs.  Chart reviewed and updated  Continue current treatment  Keep follow-up appointment    overall doing well    basal off of abdomen   ER record reviewed     FU eos now as pt is on Fasenra     Pt  was  scheduled for  US thyroid  but due to above  was unable to go   She can reschedule when  she gets  a moment        mammogram  utd    keep fu with dermatology        Orders:  Orders  Comprehensive Metabolic Panel 63/01/60 F09 - Essential (primary) hypertension, N23.55 - Eosinophilic asthma, D32.202 - Squamous cell carcinoma of skin of right upper limb, including shoulder, E04.1 - Nontoxic single thyroid nodule, R91.8 - Other nonspecific abnormal finding of lung field, E03.9 - Hypothyroidism, unspecified, M79.7 - Fibromyalgia, F41.1 - Generalized anxiety disorder    Lipid Panel 08/07/21 E78.2 - Mixed hyperlipidemia, R42.70 - Eosinophilic asthma, W23.762 - Squamous cell carcinoma of skin of right upper limb, including shoulder, E04.1 - Nontoxic single thyroid nodule, R91.8 - Other nonspecific abnormal  finding of lung field, E03.9 - Hypothyroidism, unspecified, M79.7 - Fibromyalgia, I10 - Essential (primary) hypertension, F41.1 - Generalized anxiety disorder    Complete Blood Count Auto Diff 08/07/21 R53.83 - Other fatigue, V78.46 - Eosinophilic asthma, N62.952 - Squamous cell carcinoma of skin of right upper limb, including shoulder, E04.1 - Nontoxic single thyroid nodule, R91.8 - Other nonspecific abnormal finding of lung field, E03.9 - Hypothyroidism, unspecified, M79.7 - Fibromyalgia, I10 - Essential (primary) hypertension, F41.1 - Generalized anxiety disorder    C Reactive Protein 08/07/21 M25.50 - Pain in unspecified joint, W41.32 -  Eosinophilic asthma, G40.102 - Squamous cell carcinoma of skin of right upper limb, including shoulder, E04.1 - Nontoxic single thyroid nodule, R91.8 - Other nonspecific abnormal finding of lung field, E03.9 - Hypothyroidism, unspecified, M79.7 - Fibromyalgia, I10 - Essential (primary) hypertension, F41.1 - Generalized anxiety disorder    Vitamin B12 08/07/21 E53.8 - Deficiency of other specified B group vitamins, V25.36 - Eosinophilic asthma, U44.034 - Squamous cell carcinoma of skin of right upper limb, including shoulder, E04.1 - Nontoxic single thyroid nodule, R91.8 - Other nonspecific abnormal finding of lung field, E03.9 - Hypothyroidism, unspecified, M79.7 - Fibromyalgia, I10 - Essential (primary) hypertension, F41.1 - Generalized anxiety disorder    Thyroid Stimulating Hormone 08/07/21 E03.9 - Hypothyroidism, unspecified, V42.59 - Eosinophilic asthma, D63.875 - Squamous cell carcinoma of skin of right upper limb, including shoulder, E04.1 - Nontoxic single thyroid nodule, R91.8 - Other nonspecific abnormal finding of lung field, M79.7 - Fibromyalgia, I10 - Essential (primary) hypertension, F41.1 - Generalized anxiety disorder    VIT D25(OH) TOT 08/07/21 E55.9 - Vitamin D deficiency, unspecified, I43.32 - Eosinophilic asthma, R51.884 - Squamous cell carcinoma of skin of right upper limb, including shoulder, E04.1 - Nontoxic single thyroid nodule, R91.8 - Other nonspecific abnormal finding of lung field, E03.9 - Hypothyroidism, unspecified, M79.7 - Fibromyalgia, I10 - Essential (primary) hypertension, F41.1 - Generalized anxiety disorder            Medications:  New  nitroglycerin     do not exceed 3 doses per episode 0.4 mg sublingual Q5-61M PRN 25 tabs 1RF chest pain      benralizumab (Fasenra Pen) 30 mg subcut Q8W 1 mL 3RF      budesonide-formoterol 160-4.5 mcg/actuation (Symbicort) 1 inh inhalation BID 10.2 grams 3RF          QPP  Functional Status: Bathes Self, Dresses Self, Eats Independently, Toilets On  Own, Transfers On Own and Walks On Own  Cognitive Status: Oriented, Speaks Clearly, Able To Read, Able To Write and Able To Follow Directions  Document the patient's last mammogram date in health mgt  Mammogram:       Mammogram   02/20/21    Document patient's colon cancer screening date in health mgt  Colon Cancer Tests:       Colonoscopy   04/08/16 11:41 04/08/16       Stool Occult Blood Screen (Clinic) Negative 04/09/21 11:21 04/09/21    Frailty  Does pt use assistive devices?: No Cane, No Walker and No Wheelchair        Signed By:<Electronically signed by  Rico Ala P.A.C.>08/10/21 [phone]  Copies to:                  [facility] P.A.C., Joelene Millin

## 2021-12-09 ENCOUNTER — Other Ambulatory Visit (RURAL_HEALTH_CENTER): Payer: Self-pay | Admitting: Internal Medicine

## 2021-12-12 ENCOUNTER — Other Ambulatory Visit: Payer: Self-pay

## 2021-12-12 ENCOUNTER — Encounter (RURAL_HEALTH_CENTER): Payer: Self-pay | Admitting: Internal Medicine

## 2021-12-12 ENCOUNTER — Ambulatory Visit (RURAL_HEALTH_CENTER): Payer: Commercial Managed Care - PPO | Attending: Internal Medicine | Admitting: Internal Medicine

## 2021-12-12 ENCOUNTER — Ambulatory Visit: Payer: Commercial Managed Care - PPO | Attending: Internal Medicine | Admitting: Internal Medicine

## 2021-12-12 DIAGNOSIS — Z85828 Personal history of other malignant neoplasm of skin: Secondary | ICD-10-CM | POA: Insufficient documentation

## 2021-12-12 DIAGNOSIS — E782 Mixed hyperlipidemia: Secondary | ICD-10-CM | POA: Insufficient documentation

## 2021-12-12 DIAGNOSIS — I10 Essential (primary) hypertension: Secondary | ICD-10-CM | POA: Insufficient documentation

## 2021-12-12 DIAGNOSIS — Z13820 Encounter for screening for osteoporosis: Secondary | ICD-10-CM

## 2021-12-12 DIAGNOSIS — E559 Vitamin D deficiency, unspecified: Secondary | ICD-10-CM | POA: Insufficient documentation

## 2021-12-12 DIAGNOSIS — G8929 Other chronic pain: Secondary | ICD-10-CM | POA: Insufficient documentation

## 2021-12-12 DIAGNOSIS — J8283 Eosinophilic asthma: Secondary | ICD-10-CM | POA: Insufficient documentation

## 2021-12-12 DIAGNOSIS — E039 Hypothyroidism, unspecified: Secondary | ICD-10-CM | POA: Insufficient documentation

## 2021-12-12 DIAGNOSIS — M797 Fibromyalgia: Secondary | ICD-10-CM | POA: Insufficient documentation

## 2021-12-12 DIAGNOSIS — K219 Gastro-esophageal reflux disease without esophagitis: Secondary | ICD-10-CM | POA: Insufficient documentation

## 2021-12-12 DIAGNOSIS — G629 Polyneuropathy, unspecified: Secondary | ICD-10-CM | POA: Insufficient documentation

## 2021-12-12 DIAGNOSIS — Z1231 Encounter for screening mammogram for malignant neoplasm of breast: Secondary | ICD-10-CM | POA: Insufficient documentation

## 2021-12-12 DIAGNOSIS — K589 Irritable bowel syndrome without diarrhea: Secondary | ICD-10-CM | POA: Insufficient documentation

## 2021-12-12 DIAGNOSIS — F32A Depression, unspecified: Secondary | ICD-10-CM | POA: Insufficient documentation

## 2021-12-12 LAB — LIPID PANEL
CHOL/HDL RATIO: 4.9
CHOLESTEROL: 205 mg/dL (ref 136–290)
HDL CHOL: 42 mg/dL (ref 23–92)
LDL CALC: 127 mg/dL — ABNORMAL HIGH (ref 0–100)
TRIGLYCERIDES: 182 mg/dL — ABNORMAL HIGH (ref <=150)
VLDL CALC: 36 mg/dL (ref 0–50)

## 2021-12-12 LAB — CBC WITH DIFF
BASOPHIL #: 0 10*3/uL (ref 0.00–2.50)
BASOPHIL %: 1 % (ref 0–3)
EOSINOPHIL #: 0 10*3/uL (ref 0.00–2.40)
EOSINOPHIL %: 0 % (ref 0–7)
HCT: 41 % (ref 37.0–47.0)
HGB: 14 g/dL (ref 12.5–16.0)
LYMPHOCYTE #: 0.9 10*3/uL — ABNORMAL LOW (ref 2.10–11.00)
LYMPHOCYTE %: 22 % — ABNORMAL LOW (ref 25–45)
MCH: 28.8 pg (ref 27.0–32.0)
MCHC: 34.2 g/dL (ref 32.0–36.0)
MCV: 84.1 fL (ref 78.0–99.0)
MONOCYTE #: 0.4 10*3/uL (ref 0.00–4.10)
MONOCYTE %: 9 % (ref 0–12)
MPV: 8.4 fL (ref 7.4–10.4)
NEUTROPHIL #: 2.9 10*3/uL — ABNORMAL LOW (ref 4.10–29.00)
NEUTROPHIL %: 69 % (ref 40–76)
PLATELET COMMENT: ADEQUATE
PLATELETS: 193 10*3/uL (ref 140–440)
RBC COMMENT: NORMAL
RBC: 4.88 10*6/uL (ref 4.20–5.40)
RDW: 13.5 % (ref 11.6–14.8)
WBC: 4.2 10*3/uL (ref 4.0–10.5)
WBCS UNCORRECTED: 4.2 10*3/uL

## 2021-12-12 LAB — COMPREHENSIVE METABOLIC PNL, FASTING
ALBUMIN/GLOBULIN RATIO: 1.9 — ABNORMAL HIGH (ref 0.8–1.4)
ALBUMIN: 4.3 g/dL (ref 3.5–5.7)
ALKALINE PHOSPHATASE: 83 U/L (ref 34–104)
ALT (SGPT): 14 U/L (ref 7–52)
ANION GAP: 4 mmol/L — ABNORMAL LOW (ref 10–20)
AST (SGOT): 16 U/L (ref 13–39)
BILIRUBIN TOTAL: 0.5 mg/dL (ref 0.3–1.2)
BUN/CREA RATIO: 20 (ref 6–22)
BUN: 19 mg/dL (ref 7–25)
CALCIUM, CORRECTED: 9.3 mg/dL (ref 8.9–10.8)
CALCIUM: 9.6 mg/dL (ref 8.6–10.3)
CHLORIDE: 108 mmol/L — ABNORMAL HIGH (ref 98–107)
CO2 TOTAL: 30 mmol/L (ref 21–31)
CREATININE: 0.94 mg/dL (ref 0.60–1.30)
ESTIMATED GFR: 67 mL/min/{1.73_m2} (ref >59)
GLOBULIN: 2.3 — ABNORMAL LOW (ref 2.9–5.4)
GLUCOSE: 91 mg/dL (ref 74–109)
OSMOLALITY, CALCULATED: 285 mOsm/kg (ref 270–290)
POTASSIUM: 4.2 mmol/L (ref 3.5–5.1)
PROTEIN TOTAL: 6.6 g/dL (ref 6.4–8.9)
SODIUM: 142 mmol/L (ref 136–145)

## 2021-12-12 LAB — THYROID STIMULATING HORMONE (SENSITIVE TSH): TSH: 3.03 u[IU]/mL (ref 0.450–5.330)

## 2021-12-12 LAB — VITAMIN D 25 TOTAL: VITAMIN D: 85 ng/mL (ref 30–100)

## 2021-12-12 MED ORDER — LOSARTAN 50 MG TABLET
50.0000 mg | ORAL_TABLET | Freq: Every day | ORAL | 3 refills | Status: DC
Start: 2021-12-12 — End: 2022-10-24

## 2021-12-12 MED ORDER — PRAVASTATIN 20 MG TABLET
20.0000 mg | ORAL_TABLET | Freq: Every evening | ORAL | 0 refills | Status: DC
Start: 2021-12-12 — End: 2022-02-20

## 2021-12-12 MED ORDER — LEVOTHYROXINE 50 MCG TABLET
50.0000 ug | ORAL_TABLET | Freq: Every morning | ORAL | 3 refills | Status: DC
Start: 2021-12-12 — End: 2022-02-20

## 2021-12-12 MED ORDER — PANTOPRAZOLE 40 MG TABLET,DELAYED RELEASE
40.0000 mg | DELAYED_RELEASE_TABLET | Freq: Every day | ORAL | 3 refills | Status: AC
Start: 2021-12-12 — End: 2022-03-12

## 2021-12-12 NOTE — Progress Notes (Signed)
Name: Jo Wong                       Date of Birth: 02/20/1956   MRN:  [id number]                         Date of visit: 12/12/2021     PCP: Delaney Meigs, PA-C     Subjective  Jo Wong is a 66 y.o. year old female who presents for Follow Up 4 Months   to clinic.  No specialty comments available.   Patient Active Problem List    Diagnosis Date Noted   . Eosinophilic asthma 37/12/8887   . 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 Headache, post-traumatic 11/08/2021   . Thyroid nodule 11/08/2021   . 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 Thromboembolic disorder (CMS [place]) 11/08/2021   . 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 Multiple nodules of lung 11/08/2021   . IBS (irritable bowel syndrome) 11/08/2021   . Insomnia 11/08/2021   . Acquired hypothyroidism 11/08/2021   . Mixed hyperlipidemia 11/08/2021   . Hypertension 11/08/2021   . Major depressive disorder 11/08/2021   . Prolapsed cervical intervertebral disc 11/08/2021   . Cyst of ovary 11/08/2021   . Chronic pain 11/08/2021   . Generalized anxiety disorder 11/08/2021   . Actinic keratosis 11/08/2021      Current Outpatient Medications   Medication Sig   . albuterol sulfate (PROVENTIL HFA/VENTOLIN HFA) 90 mcg Inhalation Inhaler Take 2 Puffs by inhalation Every 6 hours as needed   . benralizumab 30 mg/mL Subcutaneous Auto-Injector Inject under the skin   . budesonide-formoteroL (SYMBICORT) 160-4.5 mcg/actuation Inhalation oral inhaler Take 2 Puffs by inhalation Twice daily   . buPROPion (WELLBUTRIN XL) 300 mg extended release 24 hr tablet TAKE 1 TABLET EVERY MORNING   . Cholecalciferol, Vitamin D3, 50 mcg (2,000 unit) Oral Capsule Take by mouth   . fluorouraciL (EFUDEX) 5 % Cream Apply topically Twice daily Apply to lesions as directed (Patient not taking: Reported on 12/12/2021)   . gabapentin (NEURONTIN) 600 mg Oral Tablet Take 1 Tablet  (600 mg total) by mouth   . levothyroxine (SYNTHROID) 50 mcg Oral Tablet Take 1 Tablet (50 mcg total) by mouth Every morning for 90 days   . losartan (COZAAR) 50 mg Oral Tablet Take 1 Tablet (50 mg total) by mouth Once a day for 90 days   . mecobalamin, vitamin B12, 1,000 mcg Oral Tablet, Chewable Chew   . milnacipran (SAVELLA) 100 mg Oral Tablet Take 1 Tablet (100 mg total) by mouth Twice daily   . montelukast (SINGULAIR) 10 mg Oral Tablet Take 1 Tablet (10 mg total) by mouth Every evening   . mupirocin (BACTROBAN) 2 % Ointment Apply topically Three times a day (Patient not taking: Reported on 12/12/2021)   . nitroGLYCERIN (NITROSTAT) 0.4 mg Sublingual Tablet, Sublingual Place 1 Tablet (0.4 mg total) under the tongue Every 5 minutes as needed for Chest pain for 3 doses over 15 minutes   . omega-3 fatty acid (LOVAZA) 1 gram Oral Capsule Take by mouth Once a day   . pantoprazole (PROTONIX) 40 mg Oral Tablet, Delayed Release (E.C.) Take 1 Tablet (40 mg total) by mouth Once a day for 90 days   . pravastatin (PRAVACHOL) 20 mg Oral Tablet Take 1 Tablet (20 mg total) by mouth Every evening for 90 days  Chronic Disease Management-AMB  Follow-up visit  Still getting  Skin  Cancers removed  Still  Getting   FU   HX SQ CELL CA  Right shoulder   sq  cell cancer removed      08/2020  and  to fu wtih  Mali  Johnston   Jan  was her appt and   Jan  16th  area   got  big and   grew   fast          fast growing   6 weeks  Jan   20th   sore and  painful  and   Started on ab  x  20 days  than  bx    + sq  cell  Mohs   surgery  scheduled and  concerned  with lymph  nodes  [name]      Feb   28th  surgery lg area removed after nl [place]  to make sure lymph nodes were not involved     and  pt has  wound vac      March  2nd  Pt since has had  skin graft taken from  upper  right thigh and  graft has taken to right upper  arm     Newest area on face sq cell right cheek   Oct   effudex   bid   x  28  days    and  staph infection  by  looking  by derm and mupericon        getting better now   pt has large area  on face   crusted over   ( Started Oct  19th  and fu on  Nov  11th)      S/P BASAL CELL OFF ABD AND  NO OTHER  TREATMENT  2022     Hx  of Pneumona Dec  2021      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  on symbicort but still not great  will add albuterol  until she fu    meds discussed     ie xolair  or nucala  to discuss with [name]  when she has fu      On Trelegy     On fasenra    FU OSA  on cpap    with [name]   Nov  2022   pt still not sleeping well  to fu    Hx [name]    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     2020,2021, 2022   removed       Follow-up hypertension blood pressures been stable    Follow-up B12 deficiency patient is on over-the-counter B12    Follow-up hypothyroidism patient is on levothyroxine 50 mcg 1/day    Follow-up neuropathy and fibromyalgia patient is on gabapentin 600 mg twice daily along with Savella    gabapentin  level has been don  7/21    Follow-up GERD patient is on Protonix 40 mg daily without breakthrough symptoms    Follow-up hyperlipidemia patient is on pravastatin 20 mg daily along with omega-3    Follow up  Low  vitamin D   patient is on supplement    Follow up Depression patient takes Wellbutrin 300 daily    FU IBS- C  bm currently stable      REVIEW OF SYSTEMS:   Review of Systems  General: No fever.  No chills.  No weight changes.  HEENT: No vision changes.  Cardiac: No chest pain. No palpitations.  No dizziness.  No light-headedness.  No near syncope.  Resp: No dyspnea at rest, no dyspnea on exertion; no cough or hemoptysis; no orthopnea or PND.  GI: No N/V. No melena.  No bright red blood per rectum.  Ext: No edema.  No claudication.  Neuro: No focal weakness.  No numbness.  All other ROS negative.      Objective: BP 130/78 (Site: Left, Patient Position: Sitting)   Pulse 87   Temp 36.4 C (97.6 F) (Tympanic)   Ht 1.626 m ('5\' 4"'$ )   Wt 74.8 kg (165 lb)   SpO2 99%    BMI 28.32 kg/m              PHYSICAL EXAM  Physical Exam  Gen: NAD. Alert.   HEENT: PERRL; conjuntiva clear. No JVD or carotid bruit.  Cardiac: RRR with normal S1, S2.   Lungs: Clear to auscultation bilaterally. No rales. No wheezing. No rhonci.  Extremities: No edema. No cyanosis. No clubbing.  Neurologic:  Grossly intact  Assessment/Plan  Assessment/Plan   1. Primary hypertension    2. Mixed hyperlipidemia    3. Chronic pain    4. Acquired hypothyroidism    5. Eosinophilic asthma    6. Vitamin D deficiency    7. Screening mammogram for breast cancer    8. Screening for osteoporosis        Meds reviewed as well as labs.  Chart reviewed and updated.   Continue current treatment.  Keep follow-up appointment.   Vaccine hx reviewed.   About  10 yrs ago  Pt says  She was donating bld but was told  She could not  ? Hept  But she never had it worked up    Will do   Screening  Today   Old labs reviewed    Mammogram and dexa due in  June     Continue to fu with [name]

## 2021-12-14 LAB — HEPATITIS C ANTIBODY SCREEN WITH REFLEX TO HCV PCR: HCV ANTIBODY QUALITATIVE: NEGATIVE

## 2021-12-14 LAB — HEPATITIS B CORE IGM, AB: HBV CORE IGM ANTIBODY QUALITATIVE: NEGATIVE

## 2021-12-14 LAB — HEPATITIS B SURFACE ANTIGEN: HBV SURFACE ANTIGEN QUALITATIVE: NEGATIVE

## 2021-12-14 LAB — HEPATITIS A (HAV) IGM ANTIBODY: HAV IGM: NEGATIVE

## 2021-12-21 ENCOUNTER — Other Ambulatory Visit: Payer: Self-pay

## 2022-02-20 ENCOUNTER — Other Ambulatory Visit (RURAL_HEALTH_CENTER): Payer: Self-pay | Admitting: Internal Medicine

## 2022-02-21 IMAGING — MG 3D SCREENING MAMMO BIL AND TOMO
5 series · 8 of 24 positions shown · non-contrast
Comparison: 12/11/2022

------------- REPORT [id number] -------------
﻿

MAZANOV, ANZHELIKA-FILIPP
EXAM:  3D BILATERAL ANNUAL SCREENING DIGITAL MAMMOGRAM WITH TOMOSYNTHESIS
INDICATION: Screening.

[L]
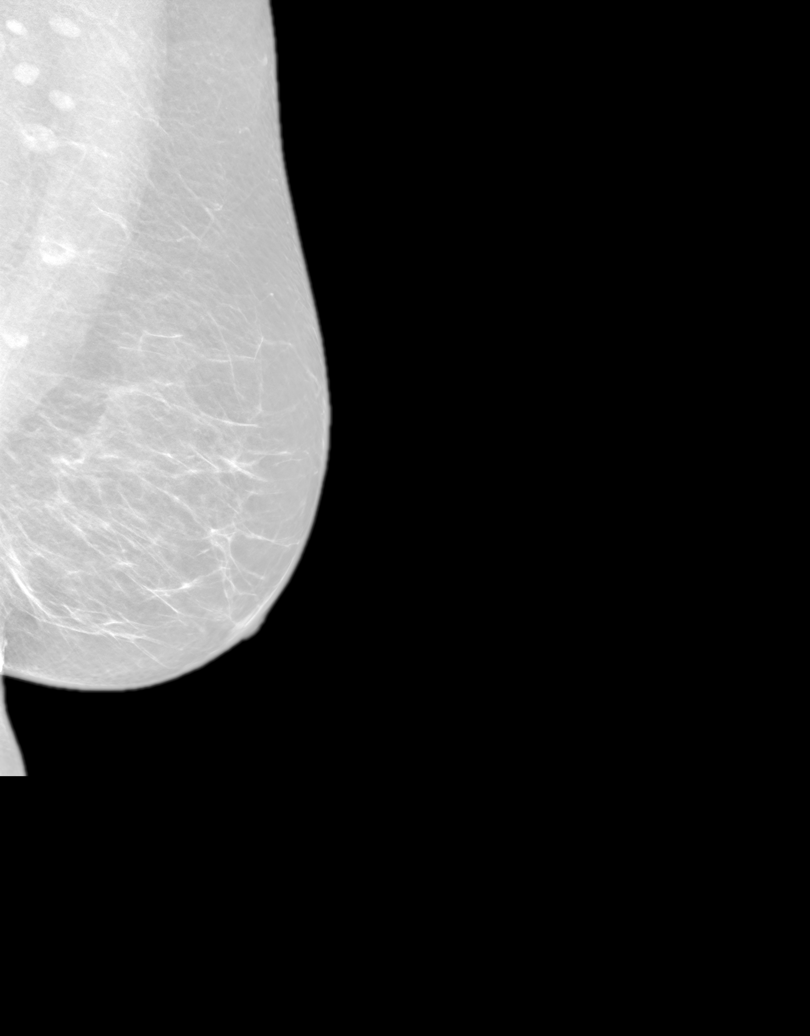

[R]
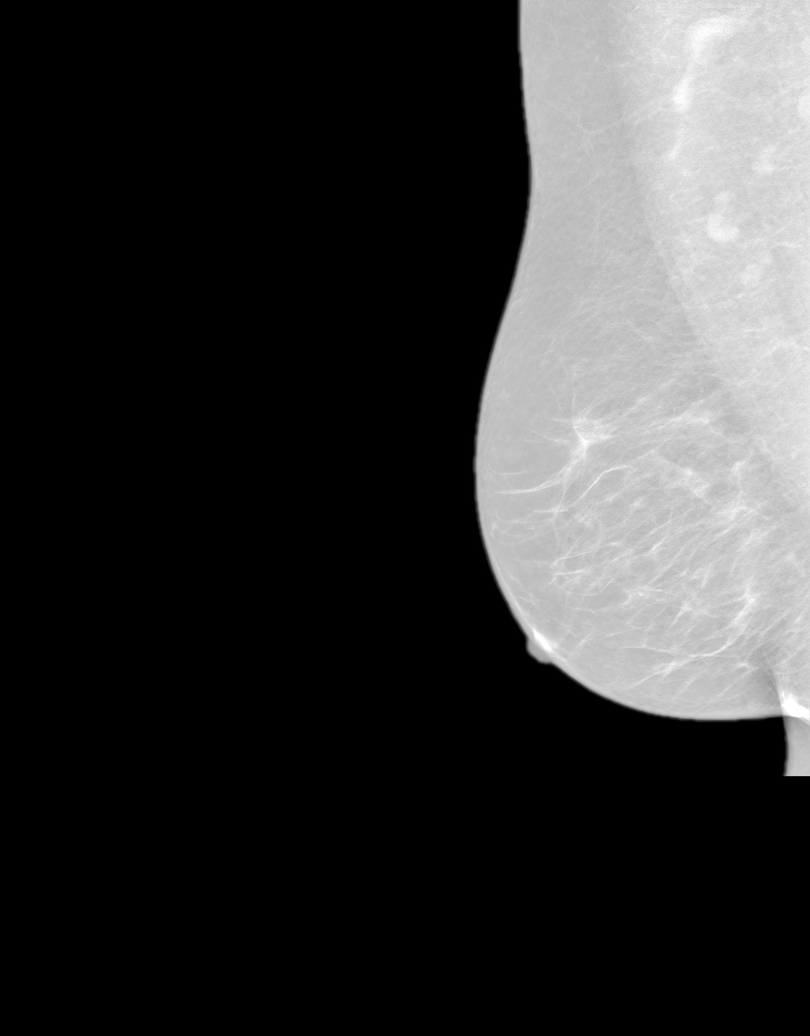

[R CC tomo · right · 0.10mm/px · 2 of 2 slices shown]
[im 1/2]
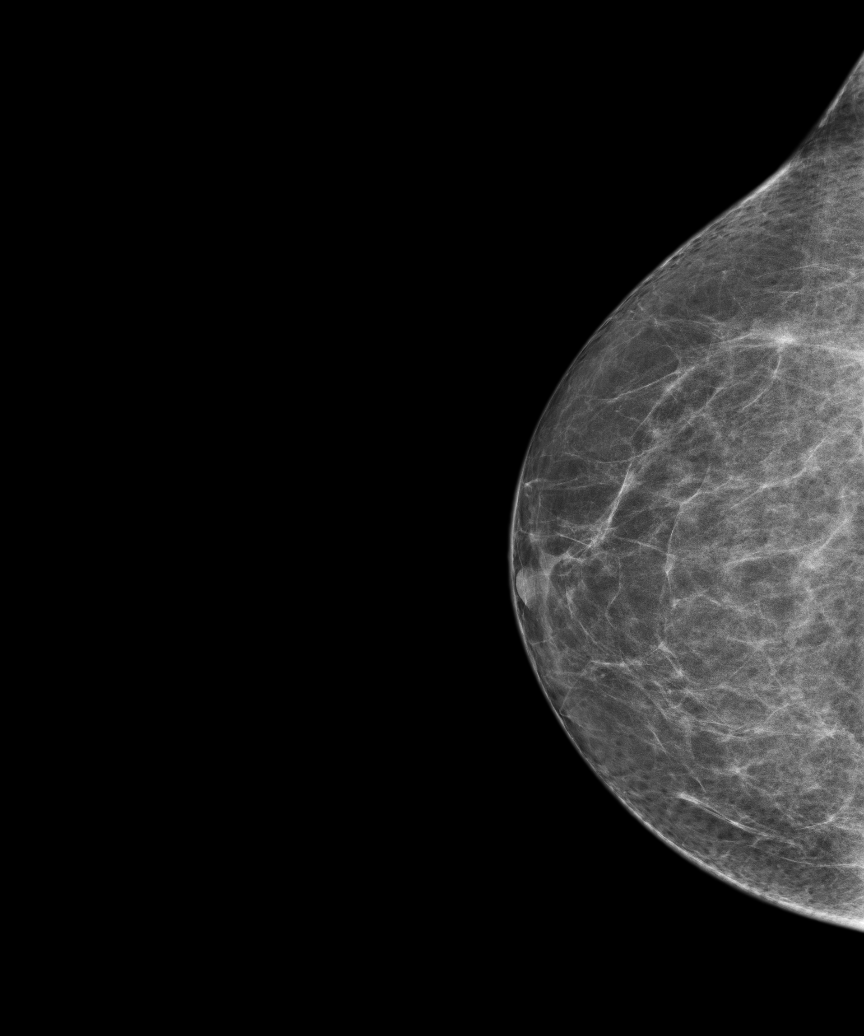
[im 2/2]
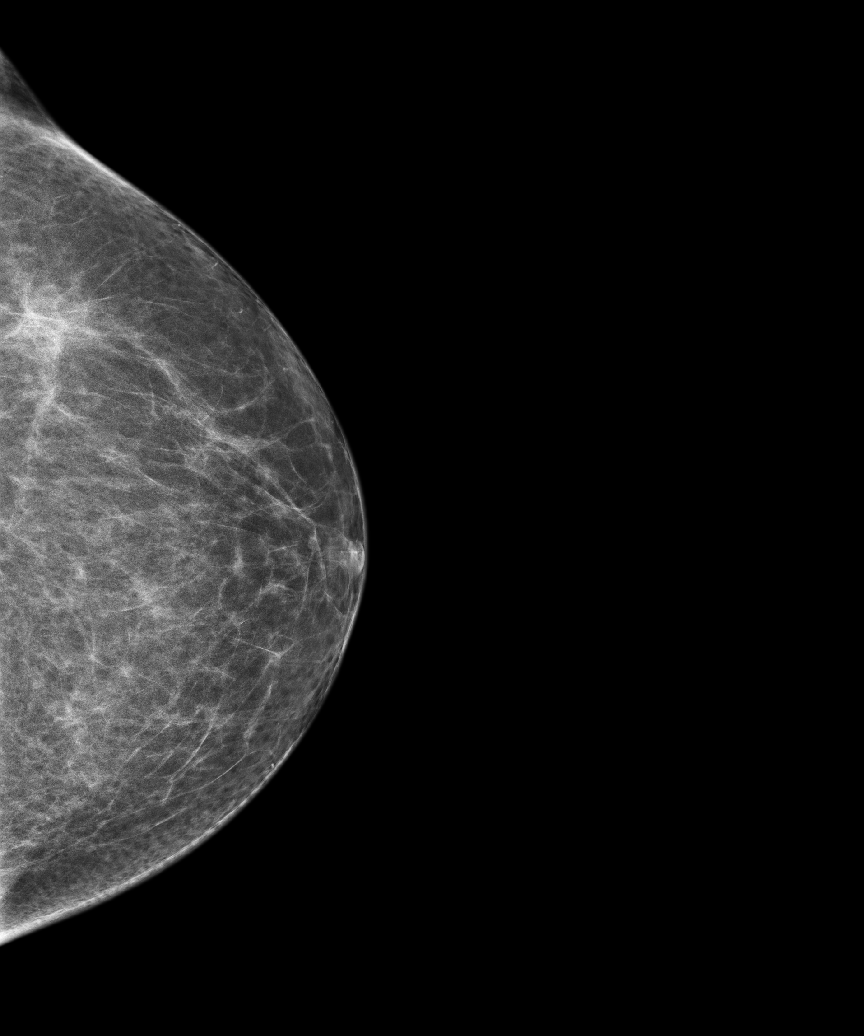

[3D SCREENING MAMMO BIL AND TOMO tomo · 2 acquisitions, 3 frames shown (1 of 2)]
[im 1/2]
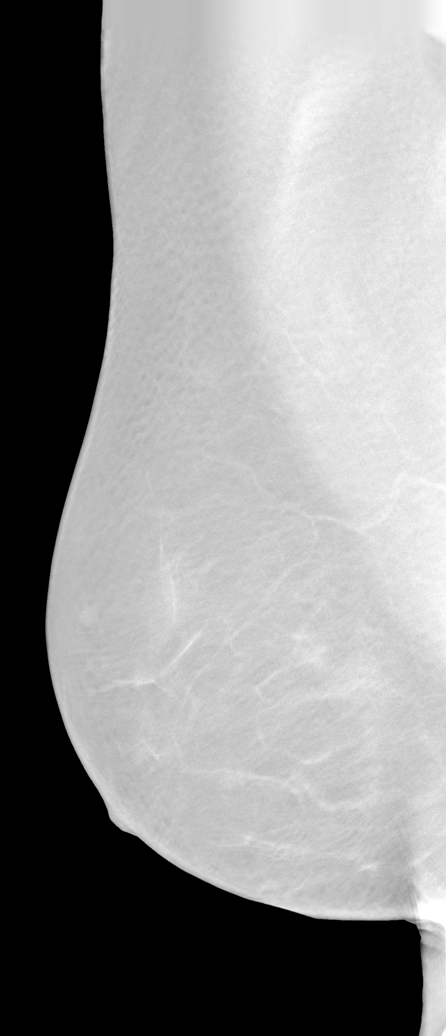
[im 2/2]
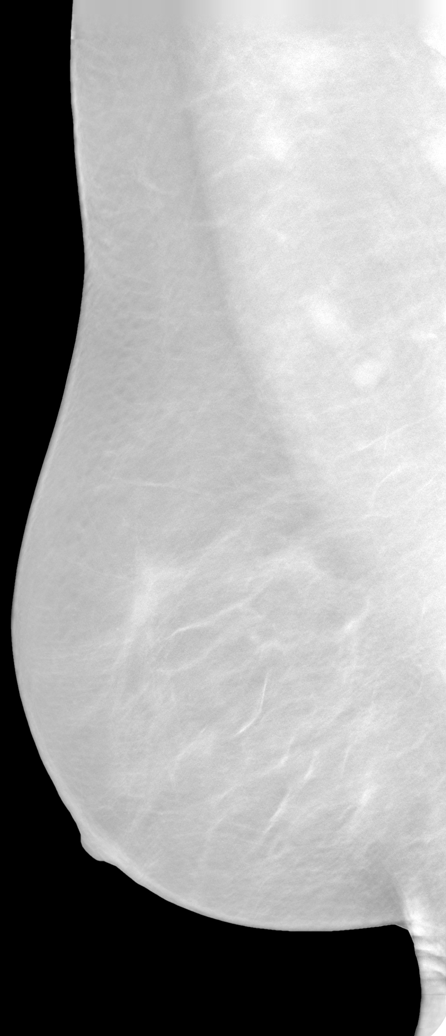
[im 2/2]
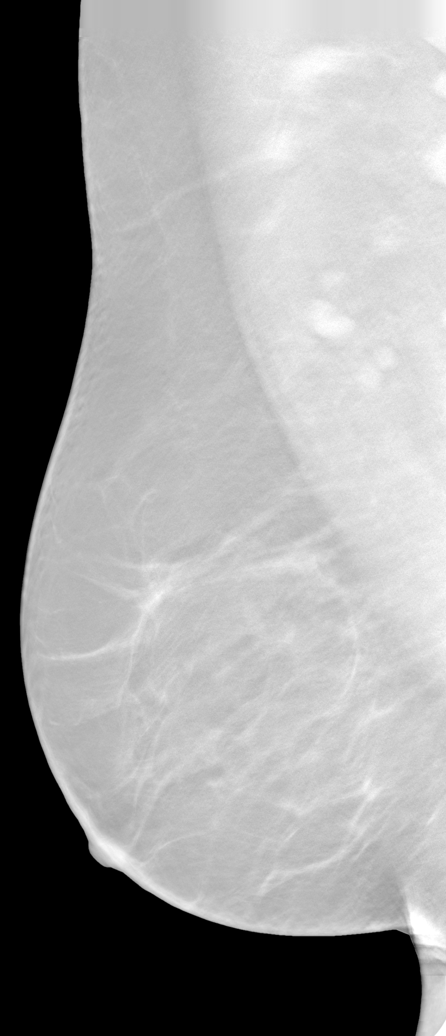

[3D SCREENING MAMMO BIL AND TOMO tomo (2 of 2) · tomo slice 13/80.0]
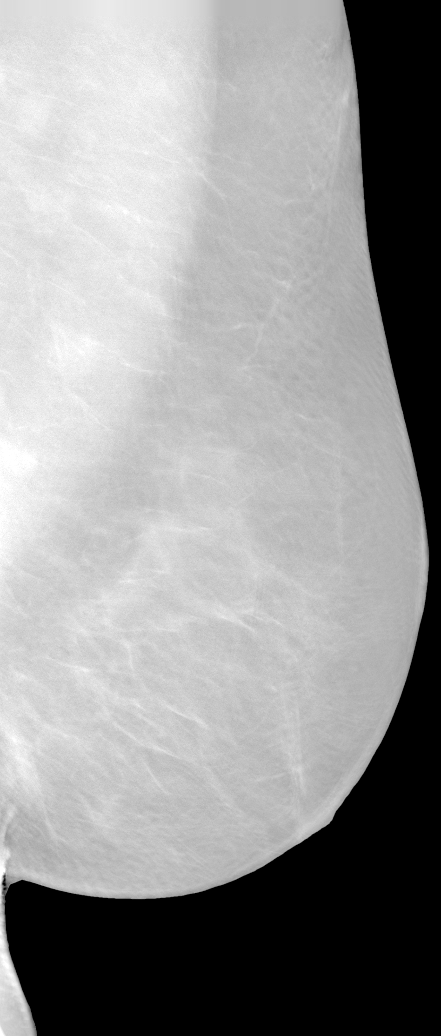

[8 of 24 positions shown; findings below may reference images not displayed]

FINDINGS: There are scattered fibroglandular elements.  There is no mass or suspicious cluster of microcalcifications.   There is no architectural distortion, skin thickening or nipple retraction.
IMPRESSION: 1.  BIRADS 2-Benign findings. Patient has been added in a reminder system with a target date for the next screening mammography.

2.  DENSITY CODE – B (Scattered areas of fibroglandular density). 

Final Assessment Code:

Bi-Rads 2 

BI-RADS 0
 Need additional imaging evaluation.

BI-RADS 1
 Negative mammogram.

BI-RADS 2
 Benign finding.

BI-RADS 3
 Probably benign finding; short-interval follow-up suggested.

BI-RADS 4
 Suspicious abnormality; biopsy should be considered.

BI-RADS 5
 Highly suggestive of malignancy; appropriate action should be taken.

BI-RADS 6
 Known biopsy-proven malignancy; appropriate action should be taken.

NOTE:
In compliance with Federal regulations, the results of this mammogram are being sent to the patient.

------------- REPORT [id number] -------------
[facility]
[address]
Tiger Ms.TIGER, SHARITA:
We wish to report the following on your recent mammography examination. We are sending a report to your referring physician or other health care provider. 
(       Normal/Negative:
No evidence of cancer.
This statement is mandated by the [place], Department of Health.
Your examination was performed by one of our technologists, who are registered radiological technologists and also specially certified in mammography:
___
[place], Marjuan (M)

Your mammogram was interpreted by our radiologist.

( 
Collette Sedman, M.D.

(Annual Breast Examination by a physician or other health care provider
(Annual Mammography Screening beginning at age 40
(Monthly Breast Self Examination

## 2022-03-04 ENCOUNTER — Other Ambulatory Visit: Payer: Self-pay

## 2022-03-04 ENCOUNTER — Encounter (INDEPENDENT_AMBULATORY_CARE_PROVIDER_SITE_OTHER): Payer: Self-pay | Admitting: Internal Medicine

## 2022-03-04 ENCOUNTER — Ambulatory Visit: Payer: Commercial Managed Care - PPO | Attending: Internal Medicine | Admitting: Internal Medicine

## 2022-03-04 VITALS — BP 128/90 | HR 95 | Temp 97.7°F | Ht 64.0 in

## 2022-03-04 DIAGNOSIS — J0100 Acute maxillary sinusitis, unspecified: Secondary | ICD-10-CM | POA: Insufficient documentation

## 2022-03-04 DIAGNOSIS — R519 Headache, unspecified: Secondary | ICD-10-CM | POA: Insufficient documentation

## 2022-03-04 DIAGNOSIS — R209 Unspecified disturbances of skin sensation: Secondary | ICD-10-CM | POA: Insufficient documentation

## 2022-03-04 MED ORDER — SULFAMETHOXAZOLE 800 MG-TRIMETHOPRIM 160 MG TABLET
1.0000 | ORAL_TABLET | Freq: Two times a day (BID) | ORAL | 0 refills | Status: DC
Start: 2022-03-04 — End: 2022-03-04

## 2022-03-04 MED ORDER — MILNACIPRAN 100 MG TABLET
100.0000 mg | ORAL_TABLET | Freq: Two times a day (BID) | ORAL | 2 refills | Status: AC
Start: 2022-03-04 — End: 2022-06-02

## 2022-03-04 MED ORDER — PREDNISONE 20 MG TABLET
20.0000 mg | ORAL_TABLET | Freq: Two times a day (BID) | ORAL | 0 refills | Status: AC
Start: 2022-03-04 — End: 2022-03-09

## 2022-03-04 MED ORDER — MILNACIPRAN 100 MG TABLET
100.0000 mg | ORAL_TABLET | Freq: Two times a day (BID) | ORAL | 2 refills | Status: DC
Start: 2022-03-04 — End: 2022-03-04

## 2022-03-04 MED ORDER — SULFAMETHOXAZOLE 800 MG-TRIMETHOPRIM 160 MG TABLET
1.0000 | ORAL_TABLET | Freq: Two times a day (BID) | ORAL | 0 refills | Status: AC
Start: 2022-03-04 — End: 2022-03-14

## 2022-03-04 MED ORDER — PREDNISONE 20 MG TABLET
20.0000 mg | ORAL_TABLET | Freq: Two times a day (BID) | ORAL | 0 refills | Status: DC
Start: 2022-03-04 — End: 2022-03-04

## 2022-03-04 NOTE — Progress Notes (Signed)
Name: Jo Wong                       Date of Birth: Dec 06, 1955   MRN:  [id number]                         Date of visit: 03/04/2022     PCP: Delaney Meigs, PA-C     Subjective  Jo Wong is a 66 y.o. year old female who presents for Numbness (Pt is having numbness pain and burning in face dermatology used a chemo cream on her face but they told her its not from the cream she also states that her nose runs on the right side of her face where its numb.)   to clinic.  No specialty comments available.   Patient Active Problem List    Diagnosis Date Noted   . Eosinophilic asthma 37/34/2876   . 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 Headache, post-traumatic 11/08/2021   . Thyroid nodule 11/08/2021   . 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 Multiple nodules of lung 11/08/2021   . IBS (irritable bowel syndrome) 11/08/2021   . Insomnia 11/08/2021   . Acquired hypothyroidism 11/08/2021   . Mixed hyperlipidemia 11/08/2021   . Hypertension 11/08/2021   . Major depressive disorder 11/08/2021   . Prolapsed cervical intervertebral disc 11/08/2021   . Cyst of ovary 11/08/2021   . Chronic pain 11/08/2021   . Generalized anxiety disorder 11/08/2021   . Actinic keratosis 11/08/2021      Current Outpatient Medications   Medication Sig   . albuterol sulfate (PROVENTIL HFA/VENTOLIN HFA) 90 mcg Inhalation Inhaler Take 2 Puffs by inhalation Every 6 hours as needed   . benralizumab 30 mg/mL Subcutaneous Auto-Injector Inject under the skin   . budesonide-formoteroL (SYMBICORT) 160-4.5 mcg/actuation Inhalation oral inhaler Take 2 Puffs by inhalation Twice daily (Patient not taking: Reported on 03/04/2022)   . buPROPion (WELLBUTRIN XL) 300 mg extended release 24 hr tablet TAKE 1 TABLET EVERY MORNING   . Cholecalciferol, Vitamin D3, 50 mcg (2,000 unit) Oral Capsule Take by mouth   . gabapentin (NEURONTIN) 600 mg Oral  Tablet Take 1 Tablet (600 mg total) by mouth   . levothyroxine (SYNTHROID) 50 mcg Oral Tablet TAKE 1 TABLET EVERY DAY   . losartan (COZAAR) 50 mg Oral Tablet Take 1 Tablet (50 mg total) by mouth Once a day for 90 days   . mecobalamin, vitamin B12, 1,000 mcg Oral Tablet, Chewable Chew   . milnacipran (SAVELLA) 100 mg Oral Tablet Take 1 Tablet (100 mg total) by mouth Twice daily for 90 days   . montelukast (SINGULAIR) 10 mg Oral Tablet Take 1 Tablet (10 mg total) by mouth Every evening   . nitroGLYCERIN (NITROSTAT) 0.4 mg Sublingual Tablet, Sublingual Place 1 Tablet (0.4 mg total) under the tongue Every 5 minutes as needed for Chest pain for 3 doses over 15 minutes   . omega-3 fatty acid (LOVAZA) 1 gram Oral Capsule Take by mouth Once a day   . pravastatin (PRAVACHOL) 20 mg Oral Tablet TAKE 1 TABLET EVERY DAY            Acute visit   This  66 yo is here for  Acute  Visit       Co Numbness tingling in  Right face  Under  Eye and down to right upper lip   This week worse    Feels like  Burning   Bee sting feeling   Few months  And they come and go until recently lasted daily  No vision  Changes       Dentures   Upper and lower      Drainage  From right nose  Yellow green at times      Eye exam up to date      Hx of HA  No real change  But  Sunday  It was different        Took excedurine  Pm and no change this time     Sinus discussed   Pt not on any allergy meds           REVIEW OF SYSTEMS:   Review of Systems  Review of Systems have been reviewed  ROS  Const  Reports system reviewed and no additional complaints, except as documented  ENT  Reports system reviewed and no additional complaints, except as documented  Resp  Reports system reviewed and no additional complaints, except as documented  GI  Reports system reviewed and no additional complaints, except as documented    Objective: BP (!) 128/90 (Site: Right, Patient Position: Sitting)   Pulse 95   Temp 36.5 C (97.7 F)   Ht 1.626 m ('5\' 4"'$ )   SpO2 94%   BMI  28.32 kg/m              PHYSICAL EXAM  Physical Exam  Gen: NAD. Alert.   Exam-AMB  Const  General: cooperative, no acute distress and alert  EYE: PERRL  Nl Conjunctiva  Sclera nonicteric   HENMT  Head: normal to inspection  General nose exam: external nose normal  Nasal drainage   Sinus pressure right maxillary and above lip  No swelling  No facial nerve changes noted  No temporal tenderness   Neck  Neck: normal visual inspection  Lymphatic: no lymphadenopathy noted  Resp  Auscultation: clear without RRW     Assessment/Plan  Assessment/Plan   1. Skin sensation disturbance    2. Sinusitis, acute maxillary    3. Headache      Bactrim ds bid  10 days    Mupirocin  Allegra  Continue  otch    Prednisone    20 bid x 5 days    Monitor  Symptoms if no better will do inflammatory markers and referral to ENT or neurology

## 2022-04-07 ENCOUNTER — Ambulatory Visit: Payer: Commercial Managed Care - PPO | Attending: Internal Medicine | Admitting: Internal Medicine

## 2022-04-07 ENCOUNTER — Other Ambulatory Visit (INDEPENDENT_AMBULATORY_CARE_PROVIDER_SITE_OTHER): Payer: Self-pay

## 2022-04-07 ENCOUNTER — Other Ambulatory Visit (INDEPENDENT_AMBULATORY_CARE_PROVIDER_SITE_OTHER): Payer: Self-pay | Admitting: Internal Medicine

## 2022-04-07 ENCOUNTER — Other Ambulatory Visit: Payer: Self-pay

## 2022-04-07 ENCOUNTER — Encounter (INDEPENDENT_AMBULATORY_CARE_PROVIDER_SITE_OTHER): Payer: Self-pay | Admitting: Internal Medicine

## 2022-04-07 VITALS — BP 126/74 | HR 104 | Temp 97.8°F | Ht 64.0 in | Wt 168.4 lb

## 2022-04-07 DIAGNOSIS — Z0000 Encounter for general adult medical examination without abnormal findings: Secondary | ICD-10-CM | POA: Insufficient documentation

## 2022-04-07 DIAGNOSIS — Z1211 Encounter for screening for malignant neoplasm of colon: Secondary | ICD-10-CM | POA: Insufficient documentation

## 2022-04-07 DIAGNOSIS — R519 Headache, unspecified: Secondary | ICD-10-CM

## 2022-04-07 LAB — POCT HEMOCCULT: HEMOCCULT CARD (SINGLE): NEGATIVE

## 2022-04-07 NOTE — Progress Notes (Signed)
INTERNAL [facility]INE[facility]  [address]  [place]  [address]  Operated by [facility]  Medicare Annual Wellness Visit    Name: Jo Wong MRN:  [id number]   Date: 04/07/2022 Age: 66 y.o.       SUBJECTIVE:   Jo Wong is a 66 y.o. female for presenting for Medicare Wellness exam.   I have reviewed and reconciled the medication list with the patient today.        04/07/2022    10:00 AM   Comprehensive Health Assessment-Adult   Do you wish to complete this form? Yes   During the past 4 weeks, how would you rate your health in general? Good   During the past 4 weeks, how much difficulty have you had doing your usual activities inside and outside your home because of medical or emotional problems? A little bit of difficulty   During the past 4 weeks, was someone available to help you if you needed and wanted help? Yes, as much as I wanted   In the past year, how many times have you gone to the emergency department or been admitted to a hospital for a health problem? None   Are you generally satisfied with your sleep? No   Do you have enough money to buy things you need in everyday life, such as food, clothing, medicines, and housing? Yes, always   Can you get to places beyond walking distance without help?  (For example, can you drive your own car or travel alone on buses)? No   Do you fasten your seatbelt when you are in a car? Yes, usually   Do you exercise 20 minutes 3 or more days per week (such as walking, dancing, biking, mowing grass, swimming)? Yes, most of the time   How often do you eat food that is healthy (fruits, vegetables, lean meats) instead of unhealthy (sweets, fast food, junk food, fatty foods)? Most of the time   How often do you have trouble taking medicines the eay you are told to take them? I always take them as prescribed   Do you need any help communicating with your doctors and nurses because of vision or hearing problems? No   During the past 12 months,  have you experienced confusion or memory loss that is happening more often or is getting worse? No   Do you have one person you think of as your personal doctor (primary care provider or family doctor)? Yes   If you are seeing a Primary Care Provider (PCP) or family doctor. please list their name Rico Ala   Are you now also seeing any specialist physician(s) (such as eye doctor, foot doctor, skin doctor)? Yes   If you are seeing a specialist for anything such as foot, eye, skin, etc.  please list their name(s) [name]  dr Chriss Driver  Mali johnson   [facility]   How confident are you that you can control or manage most of your health problems? Somewhat confident         I have reviewed and updated as appropriate the past medical, family and social history. 04/07/2022 as summarized below:  Past Medical History:   Diagnosis Date   . Acquired hypothyroidism    . Actinic keratosis    . Chronic pain    . Cyst of ovary    . Eosinophilic asthma    . Fibromyositis    . Generalized anxiety disorder    . Headache,  post-traumatic    . 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 Hypertension    . IBS (irritable bowel syndrome)    . Insomnia    . Major depressive disorder    . Mixed hyperlipidemia    . Multiple nodules of lung    . Pancreatitis    . Primary fibromyalgia syndrome    . Prolapsed cervical intervertebral disc    . 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 Stomach cramps    . Thromboembolic disorder (CMS HCC)    . Thrombosis of mesenteric vein (CMS HCC)    . Thyroid nodule      Past Surgical History:   Procedure Laterality Date   . Bilateral oophorectomy     . Colonoscopy  2017   . Hx cholecystectomy     . Hx laser skin lesion removal     . Hx thyroidectomy     . Hx tubal ligation     . Skin graft     . Skin lesion excision     . Squamous cell carcinoma excision       Current Outpatient Medications   Medication Sig   . albuterol sulfate (PROVENTIL HFA/VENTOLIN HFA) 90 mcg  Inhalation Inhaler Take 2 Puffs by inhalation Every 6 hours as needed   . benralizumab 30 mg/mL Subcutaneous Auto-Injector Inject under the skin   . buPROPion (WELLBUTRIN XL) 300 mg extended release 24 hr tablet TAKE 1 TABLET EVERY MORNING   . Cholecalciferol, Vitamin D3, 50 mcg (2,000 unit) Oral Capsule Take by mouth   . fluticasone-umeclidin-vilanter (TRELEGY ELLIPTA) 200-62.5-25 mcg Inhalation Disk with Device Take 1 INHALATION  by inhalation Once a day   . gabapentin (NEURONTIN) 600 mg Oral Tablet Take 1 Tablet (600 mg total) by mouth   . levothyroxine (SYNTHROID) 50 mcg Oral Tablet TAKE 1 TABLET EVERY DAY   . losartan (COZAAR) 50 mg Oral Tablet Take 1 Tablet (50 mg total) by mouth Once a day for 90 days   . mecobalamin, vitamin B12, 1,000 mcg Oral Tablet, Chewable Chew   . milnacipran (SAVELLA) 100 mg Oral Tablet Take 1 Tablet (100 mg total) by mouth Twice daily for 90 days   . montelukast (SINGULAIR) 10 mg Oral Tablet Take 1 Tablet (10 mg total) by mouth Every evening   . nitroGLYCERIN (NITROSTAT) 0.4 mg Sublingual Tablet, Sublingual Place 1 Tablet (0.4 mg total) under the tongue Every 5 minutes as needed for Chest pain for 3 doses over 15 minutes   . omega-3 fatty acid (LOVAZA) 1 gram Oral Capsule Take by mouth Once a day   . pravastatin (PRAVACHOL) 20 mg Oral Tablet TAKE 1 TABLET EVERY DAY     Family Medical History:     Problem Relation (Age of Onset)    Blood Clots Mother    Breast Disease Sister    Cerebral Aneurysm Brother, Maternal Aunt    Deep vein thrombosis Sister    Heart Disease Mother    Hypertension (High Blood Pressure) Mother    Stroke Mother    Ulcers Brother          Social History     Socioeconomic History   . Marital status: Married   Tobacco Use   . Smoking status: Never   . Smokeless tobacco: Never   Vaping Use   . Vaping Use: Never used   Substance and Sexual Activity   . Alcohol use: Never   . Drug use:  Never     Social Determinants of Health     Financial Resource Strain: Low Risk    .  SDOH Financial: No   Transportation Needs: Low Risk    . SDOH Transportation: No   Social Connections: Low Risk    . SDOH Social Isolation: 5 or more times a week   Intimate Partner Violence: Low Risk    . SDOH Domestic Violence: No   Housing Stability: Low Risk    . SDOH Housing Situation: I have housing.   Marland Kitchen SDOH Housing Worry: No        List of Current Health Care Providers   Care Team     PCP     Name Type Specialty Phone Number    Delaney Meigs, [place] Physician Assistant [place] [phone]          Care Team     No care team found                  Health Maintenance   Topic Date Due   . Covid-19 Vaccine (1) Never done   . Pneumococcal Vaccination, Age 13+ (1 - PCV) Never done   . Adult Tdap-Td (1 - Tdap) Never done   . Shingles Vaccine (1 of 2) Never done   . Influenza Vaccine (1) 05/23/2022   . Annual Wellness Visit  04/08/2023   . Mammography  02/22/2024   . Colonoscopy  04/08/2026   . Osteoporosis screening  02/22/2032   . Hepatitis C screening  Completed   . Meningococcal Vaccine  Aged Out   . Depression Screening  Discontinued     Medicare Wellness Assessment   Medicare initial or wellness physical in the last year?: Yes  Advance Directives   Does patient have a living will or MPOA: no   Has patient provided [employer] with a copy?: no   Advance directive information given to the patient today?: no      Activities of Daily Living   Do you need help with dressing, bathing, or walking?: No   Do you need help with shopping, housekeeping, medications, or finances?: No   Do you have rugs in hallways, broken steps, or poor lighting?: Yes   Do you have grab bars in your bathroom, non-slip strips in your tub, and hand rails on your stairs?: Yes   Urinary Incontinence Screen   Do you ever leak urine when you don't want to?: YES   Cognitive Function Screen (1=Yes, 0=No)   What is you age?: Correct   What is the time to the nearest hour?: Correct   What is the year?: Correct   What is the name  of this clinic?: Correct   Can the patient recognize two persons (the doctor, the nurse, home help, etc.)?: Correct   What is the date of your birth? (day and month sufficient) : Correct   In what year did World War II end?: Incorrect   Who is the current president of the [place]?: Correct   Count from 20 down to 1?: Correct   What address did I give you earlier?: Incorrect   Total Score: 8   Interpretation of Total Score: Greater than 6 Normal   Hearing Screen   Have you noticed any hearing difficulties?: No  After whispering 9-1-6 how many numbers did the patient repeat correctly?: 3   Fall Risk Screen   Do you feel unsteady when standing or walking?: No  Do you worry about  falling?: No  Have you fallen in the past year?: No   Vision Screen           Depression Screen   Little interest or pleasure in doing things.: Not at all  Feeling down, depressed, or hopeless: Not at all  PHQ 2 Total: 0   Pain Score   Pain Score:   7    Substance Use-Abuse Screening   Tobacco Use   In Past 12 MONTHS, how often have you used any tobacco product (for example, cigarettes, e-cigarettes, cigars, pipes, or smokeless tobacco)?: Never   Alcohol use   In the PAST 12 MONTHS, how often have you had 5 (men)/4 (women) or more drinks containing alcohol in one day?: Never   Prescription Drug Use   In the PAST 12 months, how often have you used any prescription medications just for the feeling, more than prescribed, or that were not prescribed for you? Prescriptions may include: opioids, benzodiazepines, medications for ADHD: Never         Illicit Drug Use   In the PAST 12 MONTHS, how often have you used any drugs, including marijuana, cocaine or crack, heroin, methamphetamine, hallucinogens, ecstasy/MDMA?: Never           OBJECTIVE:   BP 126/74 (Site: Left, Patient Position: Sitting)   Pulse (!) 104   Temp 36.6 C (97.8 F) (Tympanic)   Ht 1.626 m ('5\' 4"'$ )   Wt 76.4 kg (168 lb 6.4 oz)   SpO2 96%   BMI 28.91 kg/m  d  Exam-AMB  Const  General: cooperative, healthy appearing, no acute distress and alert  Orientation/Consciousness: patient oriented x3  HENMT  Ears: hearing grossly normal bilaterally, TM normal on the right and TM normal on the left  General nose exam: nares normal and nasal mucous membranes and turbinates normal  Face and sinus: sinuses nontender  Eyes  Visual Fields: normal visual fields by confrontation  Alignment and Position: alignment normal  Conjunctivae: conjunctivae normal  Sclera: sclerae normal  Pupils: PERRL  EOM: EOM intact bilaterally  Neck  Neck: normal visual inspection and no lymphadenopathy  Thyroid: thyroid normal  Chest  Chest: normal inspection of the chest  Breast/Axilla Inspection: normal inspection of the breasts  Breast/Axilla Palpation: normal palpation of the breasts  Resp  Effort & Inspection: normal respiratory effort and no respiratory distress  Auscultation: clear to auscultation bilaterally, no crackles, no rales, no rhonchi, no wheezes and no rubs  Cardio  Jugular venous pressure: no JVD  Rate: regular rate  Rhythm: regular rhythm  Heart Sounds: S1 normal, S2 normal, no click, no murmurs and no rubs  Bruits: no carotid bruits  Pulses: normal peripheral pulses  GI  Inspection: Yes normal to inspection  Palpation: soft, no hepatosplenomegaly and nontender  Percussion: normal to percussion  Auscultation: normal bowel sounds  Rectal Exam - female: normal sphincter tone, No abnormal sphincter tone and heme negative stool  GU  External Female Exam: normal external appearance and normal appearance of the urethra  Urethra: normal appearance of the urethra  Speculum Exam - Vagina: normal appearance of the vagina and normal vaginal discharge  Speculum Exam - Cervix: normal appearance of the cervix and nontender  Bimanual Exam- Vagina & Uterus: normal bimanual exam, uterine mobility normal, No tender and non-tender  Bimanual Exam- Adnexa, other: normal adnexae, no masses, No rectocele, No  enterocele and No cystocele  Pelvic Support: no cystocele noted, no enterocele noted and no retocele noted  Musc  Cervical Spine: cervical ROM normal, no cervical muscular tenderness, no pain with cervical ROM and no cervical spinal tenderness  Thoracic/Lumbar Spine: thoracic and lumbar spine normal to inspection, thoraco-lumbar ROM normal, no kyphosis, no scoliosis, no thoracic spinal tenderness and no lumbar spinal tenderness  Skin  Lesions: multiple scars and lesions  Followed by  Mali  Johnston  Rashes: no rashes  Neuro  General: patient oriented x3 and gait normal  Cranial Nerves: PERRL  Extrem  General: normal to inspection, no clubbing, no cyanosis and no edema  Psych  Mental Status: mental status grossly normal  Speech and Movement: speech and movement normal  Mood: congruent mood  Affect: normal affect  Attitude: cooperative  Insight: insight good  Judgment: judgment good       .    Health Maintenance Due   Topic Date Due   . Covid-19 Vaccine (1) Never done   . Pneumococcal Vaccination, Age 26+ (1 - PCV) Never done   . Adult Tdap-Td (1 - Tdap) Never done   . Shingles Vaccine (1 of 2) Never done      ASSESSMENT & PLAN:   Assessment/Plan   1. Encounter for Medicare annual wellness exam    2. Colon cancer screening       Identified Risk Factors/ Recommended Actions       The PHQ 2 Total: 0 depression screen is interpreted as negative.  Urinary Incontinence Plan of Care: Lifestyle modifications        Orders Placed This Encounter   . POCT Hemoccult          The patient has been educated about risk factors and recommended preventive care. Written Prevention Plan completed/ updated and given to patient (see After Visit Summary).  Eye exam utd    q  Year   June  2023     Mammogram and dexa  Up to date  [facility]     Meds reviewed as well as labs.  Chart reviewed and updated.   Continue current treatment.  Keep follow-up appointment.   Vaccine hx reviewed.       Delaney Meigs, PA-C

## 2022-04-07 NOTE — Patient Instructions (Signed)
Medicare Preventive Services  Medicare coverage information Recommendation for YOU   Heart Disease and Diabetes   Lipid profile Every 5 years or more often if at risk for cardiovascular disease  Last Lipid Panel  (Last result in the past 2 years)      Cholesterol   HDL   LDL   Direct LDL   Triglycerides      12/12/21 0926 205   42   127     182           Diabetes Screening  yearly for those at risk for diabetes, 2 tests per year for those with prediabetes Last Glucose: 91    Diabetes Self Management Training or Medical Nutrition Therapy  For those with diabetes, up to 10 hrs initial training within a year, subsequent years up to 2 hrs of follow up training Optional for those with diabetes     Medical Nutrition Therapy Three hours of one-on-one counseling in first year, two hours in subsequent years Optional for those with diabetes, kidney disease   Intensive Behavioral Therapy for Obesity  Face-to-face counseling, first month every week, month 2-6 every other week, month 7-12 every month if continued progress is documented Optional for those with Body Mass Index 30 or higher  Your Body mass index is 28.91 kg/m.   Tobacco Cessation (Quitting) Counseling   Two attempts per year, max 4 sessions per attempt, up to 8 per year, for those with tobacco-related health condition Optional for those that use tobacco   Cancer Screening   Colorectal screening   For anyone age 1 to 72 or any age if high risk:  . Screening Colonoscopy every 10 yrs if low risk,  more frequent if higher risk  OR  . Cologuard Stool DNA test once every 3 years OR  . Fecal Occult Blood Testing yearly OR  . Flexible  Sigmoidoscopy  every 5 yr OR  . CT Colonography every 5 yrs    See your schedule below   Screening Pap Test Recommended every 3 years for all women age 33 to 67, or every five years if combined with HPV test (routine screening not needed after total hysterectomy).  Medicare covers every 2 years, up to yearly if high risk.  Screening  Pelvic Exam Medicare covers every 2 years, yearly if high risk or childbearing age with abnormal Pap in last 3 yrs. See your schedule below   Screening Mammogram   Recommended every 2 years for women age 100 to 78, or more frequent if you have a higher risk. Selectively recommended for women between 40-49 based on shared decisions about risk. Covered by Medicare up to every year for women age 64 or older See your schedule below   Lung Cancer Screening  Annual low dose computed tomography (LDCT scan) is recommended for those age 88-77 who smoked 20 pack-years and are current smokers or quit smoking within past 15 years (one pack-year= smoking one PPD for one year), after counseling by your doctor or nurse clinician about the possible benefits or harms. See your schedule below   Vaccinations   Pneumococcal Vaccine: Recommended routinely age 65+ with one or two separate vaccines based on your risk    Recommended before age 75 if medical conditions with increased risk  Seasonal Influenza Vaccine: Once every flu season   Hepatitis B Vaccine: 3 doses if risk (including anyone with diabetes or liver disease)  Shingles Vaccine: Two doses at age 20 or older  Diphtheria Tetanus  Pertussis Vaccine: ONCE as adult, booster every 10 years     Immunization History   Administered Date(s) Administered   . Influenza Vaccine, 6 month-adult 06/24/2019, 08/02/2020, 08/01/2021     Shingles vaccine and Diphtheria Tetanus Pertussis vaccines are available at pharmacies or local health department without a prescription.   Other Screening   Bone Densitometry   Every 24 months for anyone at risk, including postmenopausal       Glaucoma Screening   Yearly if in high risk group such as diabetes, family history, African American age 1+ or Hispanic American age 11+      Hepatitis C Screening recommended ONCE for those born between 1945-1965, or high risk for HCV infection       HIV Testing recommended routinely at least ONCE, covered every year for  age 28 to 3 regardless of risk, and every year for age over 54 who ask for the test or higher risk  Yearly or up to 3 times in pregnancy         Abdominal Aortic Aneurysm Screening Ultrasound   Once between the age of 2-75 with a family history of AAA       Your Personalized Schedule for Preventive Tests   Health Maintenance: Pending and Last Completed       Date Due Completion Date    Osteoporosis screening Never done ---    Covid-19 Vaccine (1) Never done ---    Pneumococcal Vaccination, Age 42+ (1 - PCV) Never done ---    Adult Tdap-Td (1 - Tdap) Never done ---    Shingles Vaccine (1 of 2) Never done ---    Mammography Never done ---    Influenza Vaccine (1) 05/23/2022 08/01/2021    Annual Wellness Visit 04/08/2023 04/07/2022    Override on 04/09/2021: Previously completed    Colonoscopy 04/08/2026 ---

## 2022-04-14 ENCOUNTER — Other Ambulatory Visit (INDEPENDENT_AMBULATORY_CARE_PROVIDER_SITE_OTHER): Payer: Self-pay

## 2022-04-14 ENCOUNTER — Ambulatory Visit (INDEPENDENT_AMBULATORY_CARE_PROVIDER_SITE_OTHER): Payer: Self-pay | Admitting: Internal Medicine

## 2022-04-30 ENCOUNTER — Other Ambulatory Visit (INDEPENDENT_AMBULATORY_CARE_PROVIDER_SITE_OTHER): Payer: Self-pay | Admitting: Internal Medicine

## 2022-05-14 ENCOUNTER — Other Ambulatory Visit: Payer: Self-pay

## 2022-05-14 ENCOUNTER — Encounter (INDEPENDENT_AMBULATORY_CARE_PROVIDER_SITE_OTHER): Payer: Self-pay | Admitting: OTOLARYNGOLOGY

## 2022-05-14 ENCOUNTER — Ambulatory Visit (INDEPENDENT_AMBULATORY_CARE_PROVIDER_SITE_OTHER): Payer: Commercial Managed Care - PPO | Admitting: OTOLARYNGOLOGY

## 2022-05-14 VITALS — Ht 64.0 in | Wt 168.0 lb

## 2022-05-14 DIAGNOSIS — J3089 Other allergic rhinitis: Secondary | ICD-10-CM

## 2022-05-14 DIAGNOSIS — J342 Deviated nasal septum: Secondary | ICD-10-CM

## 2022-05-14 DIAGNOSIS — J310 Chronic rhinitis: Secondary | ICD-10-CM

## 2022-05-14 DIAGNOSIS — G501 Atypical facial pain: Secondary | ICD-10-CM

## 2022-05-14 DIAGNOSIS — J329 Chronic sinusitis, unspecified: Secondary | ICD-10-CM

## 2022-05-14 DIAGNOSIS — J343 Hypertrophy of nasal turbinates: Secondary | ICD-10-CM

## 2022-05-14 NOTE — H&P (Signed)
ENT, [place]  [place] [address]  Phone: [phone]  Fax: [phone]      Encounter Date: 05/14/2022    Patient ID: Jo Wong  MRN: [id number]    DOB: [birth year]  Age: 66 y.o. female        Referring Provider:    No referring provider defined for this encounter.    Reason for Visit:   Chief Complaint   Patient presents with    Facial Swelling     New patient here for right sided facial swelling and numbness.  Associated with sharp pain        History of Present Illness:  Jo Wong is a 66 y.o. female who is referred for eval of face/sinuses. She c/o numbness that started below the right eye starting in November and slowly progressed down her right face and above her lip. Asso with interm sharp pains like a bee sting and throb at other times, as well as rhinorrhea on the right. Denies facial weakness, epiphora, nasal congestion and fever. She had a dilated eye exam a couple months ago at [facility].       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    Headache, post-traumatic    Thyroid nodule    Thrombosis of mesenteric vein (CMS HCC)    Thromboembolic disorder (CMS HCC)    Fibromyositis    Multiple nodules of lung    IBS (irritable bowel syndrome)    Insomnia    Acquired hypothyroidism    Mixed hyperlipidemia    Hypertension    Major depressive disorder    Prolapsed cervical intervertebral disc    Cyst of ovary    Chronic pain    Generalized anxiety disorder    Actinic keratosis     Current Outpatient Medications   Medication Sig    albuterol sulfate (PROVENTIL HFA/VENTOLIN HFA) 90 mcg Inhalation Inhaler Take 2 Puffs by inhalation Every 6 hours as needed    benralizumab 30 mg/mL Subcutaneous Auto-Injector Inject under the skin    buPROPion (WELLBUTRIN XL) 300 mg extended release 24 hr tablet TAKE 1 TABLET EVERY MORNING    Cholecalciferol, Vitamin D3, 50 mcg (2,000 unit) Oral Capsule  Take by mouth    fluticasone-umeclidin-vilanter (TRELEGY ELLIPTA) 200-62.5-25 mcg Inhalation Disk with Device Take 1 INHALATION  by inhalation Once a day    gabapentin (NEURONTIN) 600 mg Oral Tablet Take 1 Tablet (600 mg total) by mouth    levothyroxine (SYNTHROID) 50 mcg Oral Tablet TAKE 1 TABLET EVERY DAY    losartan (COZAAR) 50 mg Oral Tablet Take 1 Tablet (50 mg total) by mouth Once a day for 90 days    mecobalamin, vitamin B12, 1,000 mcg Oral Tablet, Chewable Chew    milnacipran (SAVELLA) 100 mg Oral Tablet Take 1 Tablet (100 mg total) by mouth Twice daily for 90 days    montelukast (SINGULAIR) 10 mg Oral Tablet Take 1 Tablet (10 mg total) by mouth Every evening    nitroGLYCERIN (NITROSTAT) 0.4 mg Sublingual Tablet, Sublingual Place 1 Tablet (0.4 mg total) under the tongue Every 5 minutes as needed for Chest pain for 3 doses over 15 minutes    omega-3 fatty acid (LOVAZA) 1 gram Oral Capsule Take by mouth Once a day    pantoprazole (PROTONIX) 40 mg Oral Tablet, Delayed Release (E.C.)     pravastatin (PRAVACHOL) 20  mg Oral Tablet TAKE 1 TABLET EVERY EVENING     Allergies   Allergen Reactions    Doxycycline  Other Adverse Reaction (Add comment)     Sweats and flushing    vomiting    Duloxetine  Other Adverse Reaction (Add comment)     Unk.     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     Stomach cramps     Thromboembolic disorder (CMS HCC)     Thrombosis of mesenteric vein  (CMS HCC)     Thyroid nodule       Past Surgical History:   Procedure Laterality Date    BILATERAL OOPHORECTOMY      COLONOSCOPY  2017    Dr mal    HX CHOLECYSTECTOMY      HX LASER SKIN LESION REMOVAL      01/31/22    HX THYROIDECTOMY      HX TUBAL LIGATION      SKIN GRAFT      12/2020  right upper arm    SKIN LESION EXCISION      removal of pigmented skin lesion    SQUAMOUS CELL CARCINOMA EXCISION      10/2019  left shoulder and right front shoulder 03/2021  back right      Family Medical History:       Problem Relation (Age of Onset)    Blood Clots Mother    Breast Disease Sister    Cerebral Aneurysm Brother, Maternal Aunt    Deep vein thrombosis Sister    Heart Disease Mother    Hypertension (High Blood Pressure) Mother    Stroke Mother    Ulcers Brother            Social History     Tobacco Use    Smoking status: Never    Smokeless tobacco: Never   Vaping Use    Vaping Use: Never used   Substance Use Topics    Alcohol use: Never    Drug use: Never       Review of Systems:  Review of Systems    Physical Exam:  Ht 1.626 m ('5\' 4"'$ )   Wt 76.2 kg (168 lb)   BMI 28.84 kg/m       Physical Exam  Constitutional:       Appearance: Normal appearance. She is well-developed, well-groomed and normal weight.   HENT:      Head: Normocephalic and atraumatic.      Right Ear: Hearing, tympanic membrane, ear canal and external ear normal.      Left Ear: Hearing, tympanic membrane, ear canal and external ear normal.      Nose: Septal deviation and mucosal edema present.  Right Turbinates: Enlarged.      Left Turbinates: Enlarged.      Mouth/Throat:      Lips: Pink.      Mouth: Mucous membranes are moist.      Pharynx: Oropharynx is clear. Uvula midline.   Eyes:      Extraocular Movements: Extraocular movements intact.   Neck:      Trachea: Phonation normal.   Pulmonary:      Effort: Pulmonary effort is normal.   Musculoskeletal:      Cervical back: Normal range of motion and neck supple.   Lymphadenopathy:      Cervical: No  cervical adenopathy.   Skin:     General: Skin is warm.   Neurological:      Mental Status: She is alert and oriented to person, place, and time.      Cranial Nerves: Cranial nerves 2-12 are intact. No facial asymmetry.   Psychiatric:         Attention and Perception: Attention normal.         Mood and Affect: Mood normal.         Speech: Speech normal.         Behavior: Behavior normal. Behavior is cooperative.       Assessment:  ENCOUNTER DIAGNOSES     ICD-10-CM   1. Facial pain, atypical  G50.1   2. Nasal turbinate hypertrophy  J34.3   3. Non-seasonal allergic rhinitis due to other allergic trigger  J30.89   4. 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  ?  Trigeminal neuralgia, refer to neurology  Order CT sinus r/o complicated sinusitis  Nasal saline b.i.d.  Orders Placed This Encounter    [phone] - NASAL ENDOSCOPY DIAGNOSTIC UNILATERAL OR BILATERAL (AMB ONLY)    CT SINUSES WO IV CONTRAST    Refer to [place]     Return for Review of CT sinus once completed.    The advanced practice clinician's documentation was reviewed/amended in its entirety with the assessment and plan portion completely performed independently by me during this encounter.    Gillermo Murdoch, DO      Mathis Dad, PA-C  05/14/2022, 08:36

## 2022-05-14 NOTE — Procedures (Signed)
ENT, [place]  [address]  [place] [address]    Procedure Note    Name: Jo Wong MRN:  [id number]   Date: 05/14/2022 Age: 66 y.o.       [address] - NASAL ENDOSCOPY DIAGNOSTIC UNILATERAL OR BILATERAL (AMB ONLY)  Performed by: Gillermo Murdoch, DO  Authorized by: Gillermo Murdoch, DO     Time Out:     Immediately before the procedure, a time out was called:  Yes    Patient verified:  Yes    Procedure Verified:  Yes    Site Verified:  Yes  Indications for procedure: Facial pain / Headache    Anesthesia: Oxymetazoline nasal spray    Description: Nasal endoscopy with rigid scope was performed with examination of the  septum, inferior, middle, and superior meatus, turbinates, sphenoethmoidal recess, and nasopharynx.     There were no polyps, pus, or granulation tissue noted.  ET orifices and nasopharynx were normal.     Findings: Septal deviation, Inferior turbinate hypertrophy, and Allergic rhinitis    The patient tolerated the procedure well.         Gillermo Murdoch, DO

## 2022-05-15 ENCOUNTER — Telehealth (INDEPENDENT_AMBULATORY_CARE_PROVIDER_SITE_OTHER): Payer: Self-pay | Admitting: OTOLARYNGOLOGY

## 2022-05-15 ENCOUNTER — Telehealth (HOSPITAL_COMMUNITY): Payer: Self-pay | Admitting: OTOLARYNGOLOGY

## 2022-05-23 ENCOUNTER — Ambulatory Visit (INDEPENDENT_AMBULATORY_CARE_PROVIDER_SITE_OTHER): Payer: Commercial Managed Care - PPO | Admitting: NEUROLOGY

## 2022-06-06 ENCOUNTER — Other Ambulatory Visit: Payer: Self-pay

## 2022-06-06 ENCOUNTER — Inpatient Hospital Stay
Admission: RE | Admit: 2022-06-06 | Discharge: 2022-06-06 | Disposition: A | Discharge status: Home / Self Care | Payer: Commercial Managed Care - PPO | Source: Ambulatory Visit | Attending: OTOLARYNGOLOGY | Admitting: OTOLARYNGOLOGY

## 2022-06-06 DIAGNOSIS — J310 Chronic rhinitis: Secondary | ICD-10-CM | POA: Insufficient documentation

## 2022-06-06 DIAGNOSIS — J3089 Other allergic rhinitis: Secondary | ICD-10-CM

## 2022-06-06 DIAGNOSIS — J329 Chronic sinusitis, unspecified: Secondary | ICD-10-CM

## 2022-06-09 ENCOUNTER — Other Ambulatory Visit (RURAL_HEALTH_CENTER): Payer: Self-pay | Admitting: Internal Medicine

## 2022-06-09 MED ORDER — GABAPENTIN 600 MG TABLET
600.0000 mg | ORAL_TABLET | Freq: Two times a day (BID) | ORAL | 1 refills | Status: DC
Start: 2022-06-09 — End: 2022-12-15

## 2022-06-11 ENCOUNTER — Encounter (INDEPENDENT_AMBULATORY_CARE_PROVIDER_SITE_OTHER): Payer: Self-pay | Admitting: NEUROLOGY

## 2022-06-17 ENCOUNTER — Other Ambulatory Visit: Payer: Self-pay

## 2022-06-17 ENCOUNTER — Other Ambulatory Visit: Payer: Commercial Managed Care - PPO | Attending: NEUROLOGY

## 2022-06-17 ENCOUNTER — Encounter (INDEPENDENT_AMBULATORY_CARE_PROVIDER_SITE_OTHER): Payer: Self-pay | Admitting: NEUROLOGY

## 2022-06-17 ENCOUNTER — Ambulatory Visit (INDEPENDENT_AMBULATORY_CARE_PROVIDER_SITE_OTHER): Payer: Commercial Managed Care - PPO | Admitting: NEUROLOGY

## 2022-06-17 VITALS — BP 148/92 | HR 98 | Temp 97.8°F | Ht 64.0 in | Wt 171.4 lb

## 2022-06-17 DIAGNOSIS — G527 Disorders of multiple cranial nerves: Secondary | ICD-10-CM

## 2022-06-17 DIAGNOSIS — G501 Atypical facial pain: Secondary | ICD-10-CM

## 2022-06-17 DIAGNOSIS — G609 Hereditary and idiopathic neuropathy, unspecified: Secondary | ICD-10-CM | POA: Insufficient documentation

## 2022-06-17 DIAGNOSIS — R200 Anesthesia of skin: Secondary | ICD-10-CM

## 2022-06-17 DIAGNOSIS — G500 Trigeminal neuralgia: Secondary | ICD-10-CM

## 2022-06-17 DIAGNOSIS — G629 Polyneuropathy, unspecified: Secondary | ICD-10-CM

## 2022-06-17 LAB — THYROID STIMULATING HORMONE WITH FREE T4 REFLEX: TSH: 3.048 u[IU]/mL (ref 0.450–5.330)

## 2022-06-17 LAB — HGA1C (HEMOGLOBIN A1C WITH EST AVG GLUCOSE): HEMOGLOBIN A1C: 4.9 % (ref 4.0–6.0)

## 2022-06-17 LAB — VITAMIN B12: VITAMIN B 12: >1500 pg/mL — ABNORMAL HIGH (ref 180–914)

## 2022-06-17 NOTE — Progress Notes (Signed)
ASSESSMENT  Right-sided facial pain:  She is a 66 year old woman who is referred for 10 months of progressive facial pain and numbness on the right side.  Symptoms started somewhat insidiously with frank numbness before becoming painful and tender to touch.  Her exam is notable for sensory deficits in V1 and V2 on the right side as well as patchy sensory deficits in the lower extremities.  Given the possibility of multiple mononeuropathies with accompanying right visual acuity decline, I would like to obtain an MRI with and without contrast to rule out inflammatory lesions such as multiple sclerosis or sarcoid.  Although the progression is atypical, her symptoms now seem most compatible with a trigeminal neuralgia on the right side.  I would like to obtain blink reflex testing to quantify this.  I would also like to obtain an MRA of the head to assess for vascular compression of the trigeminal nerve as can be the case in trigeminal neuralgia.  Given her symptoms and other potential immune mediated diseases, I would like to obtain rheumatologic screening which will increase Sjogren's syndrome antibodies which can cause multiple mononeuropathies or just facial paresthesias alone.  2.   Extremity paresthesias:  She has a longer standing history of paresthesias in the arms and legs.  These can be painful at times.  Her exam was notable for mostly small fiber deficits in a patchy distribution in the lower extremities calling into question possible small fiber neuropathy which does have an association with fibromyalgia.  We will obtain an EMG to screen for large fiber neuropathy.  I will also obtain screening labs for reversible causes of neuropathy and rheumatologic labs as above.    PLAN  1. Obtain NCS/EMG      Obtain MRI of the brain with and without contrast      Obtain intracranial MRA      Obtain Sjogren's antibodies, rheumatoid factor, anti CCP antibody, ANA  2.  Obtain B12, A1c, SPEP, TSH    We will determine  follow-up at EMG visit.    Thank you for allowing me to participate in your patient's care and please do not hesitate to contact me for any questions or concerns.    Adrian Prows, DO  Assistant Professor of Neurology  [facility]     I personally spent a total of  minutes today preparing to see the patient, in the encounter with the patient, and documenting after the visit.    ==========================================================================================================================================    NAME:  Jo Wong  DOB:  [birth year]  VISIT DATE:  06/17/2022    CC:  Facial Numbness    Patient seen in consultation at the request of Dr. Gillermo Murdoch  History obtained from the patient and chart/records  Age of patient:  66 y.o.      HPI:   I had the pleasure of seeing your patient in neurology clinic for an outpatient consultation, who is a 66 y.o. year old female who was referred for evaluation of facial numbness.  Please allow me to summarize the history for the record.     She states that symptoms began in November 2022.  It started with numbness that began under the right eye.  She did have a red spot in the same location at the time.  She does have a history of skin cancer and was given topical chemotherapeutic agent by her dermatologist to treat this.  Despite this, the numbness persisted and then began to extend down  the right side of the face to the lip and laterally towards the ear.  Gradually it became painful and tender to touch.  She reports the even light stimulus now can cause pain on the right side of her face.  She denies any weakness.  She does report that her right eye seems to have had a very mild decline in visual acuity.  She denies any changes in taste.  She does have a previous history of headaches though nothing with similar symptoms.  She also reports more longstanding numbness and tingling in the arms and legs.  She does report a  history of fibromyalgia.  She does have some lightheadedness with standing.  She does have issues with constipation and diarrhea as she has a history of IBS.    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    Headache, post-traumatic    Thyroid nodule    Thrombosis of mesenteric vein (CMS HCC)    Thromboembolic disorder (CMS HCC)    Fibromyositis    Multiple nodules of lung    IBS (irritable bowel syndrome)    Insomnia    Acquired hypothyroidism    Mixed hyperlipidemia    Hypertension    Major depressive disorder    Prolapsed cervical intervertebral disc    Cyst of ovary    Chronic pain    Generalized anxiety disorder    Actinic keratosis     Past Surgical History:   Procedure Laterality Date    BILATERAL OOPHORECTOMY      COLONOSCOPY  2017    Dr mal    HX CHOLECYSTECTOMY      HX LASER SKIN LESION REMOVAL      01/31/22    HX THYROIDECTOMY      HX TUBAL LIGATION      SKIN GRAFT      12/2020  right upper arm    SKIN LESION EXCISION      removal of pigmented skin lesion    SQUAMOUS CELL CARCINOMA EXCISION      10/2019  left shoulder and right front shoulder 03/2021  back right         Family Medical History:       Problem Relation (Age of Onset)    Aortic Aneurysm Brother    Blood Clots Mother    Breast Disease Sister    Cerebral Aneurysm Maternal Aunt    Deep vein thrombosis Sister    Heart Disease Mother    Hypertension (High Blood Pressure) Mother    Stroke Mother    Ulcers Brother            Current Outpatient Medications   Medication Sig Dispense Refill    albuterol sulfate (PROVENTIL HFA/VENTOLIN HFA) 90 mcg Inhalation Inhaler Take 2 Puffs by inhalation Every 6 hours as needed      benralizumab 30 mg/mL Subcutaneous Auto-Injector Inject 1 mL (30 mg total) under the skin      buPROPion (WELLBUTRIN XL)  300 mg extended release 24 hr tablet TAKE 1 TABLET EVERY MORNING 90 Tablet 2    Cholecalciferol, Vitamin D3, 50 mcg (2,000 unit) Oral Capsule Take 1 Capsule by mouth Once a day      fluticasone-umeclidin-vilanter (TRELEGY ELLIPTA) 200-62.5-25 mcg Inhalation Disk with Device Take 1 INHALATION  by inhalation Once a day      gabapentin (NEURONTIN) 600 mg Oral Tablet Take 1 Tablet (  600 mg total) by mouth Twice daily 180 Tablet 1    levothyroxine (SYNTHROID) 50 mcg Oral Tablet TAKE 1 TABLET EVERY DAY 90 Tablet 1    losartan (COZAAR) 50 mg Oral Tablet Take 1 Tablet (50 mg total) by mouth Once a day for 90 days 90 Tablet 3    mecobalamin, vitamin B12, 1,000 mcg Oral Tablet, Chewable Chew 1 Tablet (1,000 mcg total) Once a day      milnacipran (SAVELLA) 100 mg Oral Tablet Take 1 Tablet (100 mg total) by mouth Twice daily      montelukast (SINGULAIR) 10 mg Oral Tablet Take 1 Tablet (10 mg total) by mouth Every evening      nitroGLYCERIN (NITROSTAT) 0.4 mg Sublingual Tablet, Sublingual Place 1 Tablet (0.4 mg total) under the tongue Every 5 minutes as needed for Chest pain for 3 doses over 15 minutes      omega-3 fatty acid (LOVAZA) 1 gram Oral Capsule Take 1 Capsule (1 g total) by mouth Once a day      pantoprazole (PROTONIX) 40 mg Oral Tablet, Delayed Release (E.C.) 1 Tablet (40 mg total) Once a day      pravastatin (PRAVACHOL) 20 mg Oral Tablet TAKE 1 TABLET EVERY EVENING 90 Tablet 1     No current facility-administered medications for this visit.     Allergies   Allergen Reactions    Doxycycline  Other Adverse Reaction (Add comment)     Sweats and flushing    vomiting    Duloxetine  Other Adverse Reaction (Add comment)     Unk.     Social History     Socioeconomic History    Marital status: Married     Spouse name: Not on file    Number of children: Not on file    Years of education: Not on file    Highest education level: Not on file   Occupational History    Not on file   Tobacco Use    Smoking status: Never    Smokeless  tobacco: Never   Vaping Use    Vaping Use: Never used   Substance and Sexual Activity    Alcohol use: Never    Drug use: Never    Sexual activity: Not on file   Other Topics Concern    Not on file   Social History Narrative    Not on file     Social Determinants of Health     Financial Resource Strain: Low Risk  (03/04/2022)    Financial Resource Strain     SDOH Financial: No   Transportation Needs: Low Risk  (03/04/2022)    Transportation Needs     SDOH Transportation: No   Social Connections: Low Risk  (03/04/2022)    Social Connections     SDOH Social Isolation: 5 or more times a week   Intimate Partner Violence: [place]  (03/04/2022)    Intimate Partner Violence     SDOH Domestic Violence: No   Housing Stability: [place]  (03/04/2022)    Housing Stability     SDOH Housing Situation: I have housing.     SDOH Housing Worry: No       ============================================================================================================================================  GENERAL EXAMINATION  BP (!) 148/92 (Site: Left, Patient Position: Sitting, Cuff Size: Adult)   Pulse 98   Temp 36.6 C (97.8 F) (Temporal)   Ht 1.626 m ('5\' 4"'$ )   Wt 77.7 kg (171 lb 6.4 oz)   SpO2 96%   BMI  29.42 kg/m   Vital signs personally reviewed  General: No acute distress, alert  HEENT: Normocephalic, no scleral icterus  Pulmonary: No accessory muscle use, no tachypnea  Cardiovascular: Heart with regular rate & rhythm  Extremities: No significant edema, No cyanosis    NEUROLOGIC EXAM  On neurological exam, patient was awake, alert and answering questions appropriately  Speech was fluent, without dysarthria or aphasia.    CN  II:  Visual fields intact to confrontation  III, IV, VI: extraocular movements intact without nystagmus  V:  Diminished over V1 and V2 distributions on the right  VII: face symmetric without weakness  VIII: grossly intact  IX, X: symmetric palatal elevation  XI: normal strength of trapezius and sternocleidomastoid  bilaterally  XII: tongue midline with full movements    MOTOR  Bulk: normal  Abnormal Movements: none    Strength:     MRC Grading Scale   Right Left   Deltoid 5 5   Biceps 5 5   Triceps 5 5   Wrist Extension 5 5   Wrist Flexion - -   Finger Extension 5 5   Finger Abduction 5 5   Finger Flexion 5 5   Hip Flexion 5 5   Hip Extension - -   Hip Abduction - -   Hip Adduction - -   Knee Extension 5 5   Knee Flexion 5 5   Ankle Dorsiflexion 5 5   Ankle Plantarflexion 5 5   Toe Extension 5 5   Toe Flexion - -     REFLEXES   Right Left   Biceps 2 2   Triceps 2 2   Brachioradialis 2 2   Patellar 2 2   Achilles 1 1   Plantar - -   Hoffman negative Negative   Pectoralis - -   Jaw Jerk - -       SENSORY  Pin:  Patchy sensory loss in the lower extremities, intact in the uppers  Vibration:  Question mild vibratory loss of the toes    GAIT  General: casual, normal gait  Toe Walk: normal  Heel Walk: normal  Tandem: normal    COORDINATION  Finger nose finger: normal    ================================================================================================================================LABS  Personal Review of prior labs is notable for:   2023  Hepatitis panel negative  Vitamin-D within normal limits   LDL 127   TSH within normal limits   CMP largely within normal limits   CBC largely within normal limits  IMAGING  Personal Review of imaging is notable for:  No pertinent imaging for review  OTHER DIAGNOSTICS  Personal Review of other prior diagnostics is notable for:  Not applicable

## 2022-06-18 ENCOUNTER — Encounter (INDEPENDENT_AMBULATORY_CARE_PROVIDER_SITE_OTHER): Payer: Self-pay | Admitting: OTOLARYNGOLOGY

## 2022-06-18 ENCOUNTER — Ambulatory Visit (INDEPENDENT_AMBULATORY_CARE_PROVIDER_SITE_OTHER): Payer: Commercial Managed Care - PPO | Admitting: NEUROLOGY

## 2022-06-18 ENCOUNTER — Ambulatory Visit (INDEPENDENT_AMBULATORY_CARE_PROVIDER_SITE_OTHER): Payer: Commercial Managed Care - PPO | Admitting: OTOLARYNGOLOGY

## 2022-06-18 VITALS — Wt 170.0 lb

## 2022-06-18 DIAGNOSIS — G500 Trigeminal neuralgia: Secondary | ICD-10-CM

## 2022-06-18 DIAGNOSIS — J3089 Other allergic rhinitis: Secondary | ICD-10-CM

## 2022-06-18 DIAGNOSIS — J342 Deviated nasal septum: Secondary | ICD-10-CM

## 2022-06-18 DIAGNOSIS — J3489 Other specified disorders of nose and nasal sinuses: Secondary | ICD-10-CM

## 2022-06-18 DIAGNOSIS — J310 Chronic rhinitis: Secondary | ICD-10-CM

## 2022-06-18 DIAGNOSIS — J329 Chronic sinusitis, unspecified: Secondary | ICD-10-CM

## 2022-06-18 DIAGNOSIS — J343 Hypertrophy of nasal turbinates: Secondary | ICD-10-CM

## 2022-06-18 DIAGNOSIS — G501 Atypical facial pain: Secondary | ICD-10-CM

## 2022-06-18 DIAGNOSIS — G629 Polyneuropathy, unspecified: Secondary | ICD-10-CM

## 2022-06-18 NOTE — H&P (Signed)
ENT, [place]  [place] [address]  Phone: [phone]  Fax: [phone]      Encounter Date: 06/18/2022    Patient ID: Jo Wong  MRN: [id number]    DOB: 1956/08/22  Age: 66 y.o. female     Progress Note       Referring Provider:  No ref. provider found    Reason for Visit:   Chief Complaint   Patient presents with    Follow-up After Testing     Patient here to follow up after sinus CT        History of Present Illness:  Jo Wong is a 66 y.o. female who is FU on sinuses and progressive facial numbness, here to review the Sinus CT. No change in sx's.  Pt was seen by Neurology yesterday and an MRI and MRA was ordered, as well as a referral to Rheumatology.     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  Non-seasonal allergic rhinitis due to other allergic trigger, Chronic rhinosinusitis     TECHNIQUE: CT imaging of the paranasal sinuses without contrast.     COMPARISON:  None.  # of known CTs in the past 12 months:  0   # of known Cardiac Nuclear Medicine Studies in the past 12 months:  0     FINDINGS:   Frontal: Frontal sinuses and frontoethmoidal recesses appear clear.     Ethmoid: Ethmoid air cells appear clear.     Sphenoid: Sphenoid sinuses and sphenoethmoidal recesses appear clear.     Maxillary: Maxillary sinuses appear clear.  The ostiomeatal units appear widely patent.     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     Nasal Septum: There is deviation of the bony nasal septum to the right. No large bony nasal spur.     Mastoids/Middle Ears: Clear at visualized levels.     Visualized intracranial structures are unremarkable.        IMPRESSION:  1.CLEAR PARANASAL SINUSES AND MASTOID AIR CELLS.  2.DEVIATION OF THE BONY NASAL  SEPTUM TO THE RIGHT.   3.CONCHA BULLOSA INVOLVING THE LEFT MIDDLE TURBINATE.       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    Headache, post-traumatic    Thyroid nodule    Thrombosis of mesenteric vein (CMS HCC)    Thromboembolic disorder (CMS HCC)    Fibromyositis    Multiple nodules of lung    IBS (irritable bowel syndrome)    Insomnia    Acquired hypothyroidism    Mixed hyperlipidemia    Hypertension    Major depressive disorder    Prolapsed cervical intervertebral disc    Cyst of ovary    Chronic pain    Generalized anxiety disorder    Actinic keratosis     Current Outpatient Medications   Medication Sig    albuterol sulfate (PROVENTIL HFA/VENTOLIN HFA) 90 mcg Inhalation Inhaler Take 2 Puffs by inhalation Every 6 hours as needed    benralizumab 30 mg/mL Subcutaneous Auto-Injector Inject 1 mL (30 mg total) under the skin    buPROPion (WELLBUTRIN XL) 300 mg extended release  24 hr tablet TAKE 1 TABLET EVERY MORNING    Cholecalciferol, Vitamin D3, 50 mcg (2,000 unit) Oral Capsule Take 1 Capsule by mouth Once a day    fluticasone-umeclidin-vilanter (TRELEGY ELLIPTA) 200-62.5-25 mcg Inhalation Disk with Device Take 1 INHALATION  by inhalation Once a day    gabapentin (NEURONTIN) 600 mg Oral Tablet Take 1 Tablet (600 mg total) by mouth Twice daily    levothyroxine (SYNTHROID) 50 mcg Oral Tablet TAKE 1 TABLET EVERY DAY    losartan (COZAAR) 50 mg Oral Tablet Take 1 Tablet (50 mg total) by mouth Once a day for 90 days    mecobalamin, vitamin B12, 1,000 mcg Oral Tablet, Chewable Chew 1 Tablet (1,000 mcg total) Once a day    milnacipran (SAVELLA) 100 mg Oral Tablet Take 1 Tablet (100 mg total) by mouth Twice daily    montelukast (SINGULAIR) 10 mg Oral Tablet Take 1 Tablet (10 mg total) by mouth Every evening    nitroGLYCERIN (NITROSTAT) 0.4 mg Sublingual Tablet, Sublingual Place 1 Tablet (0.4 mg total) under the tongue Every 5  minutes as needed for Chest pain for 3 doses over 15 minutes    omega-3 fatty acid (LOVAZA) 1 gram Oral Capsule Take 1 Capsule (1 g total) by mouth Once a day    pantoprazole (PROTONIX) 40 mg Oral Tablet, Delayed Release (E.C.) 1 Tablet (40 mg total) Once a day    pravastatin (PRAVACHOL) 20 mg Oral Tablet TAKE 1 TABLET EVERY EVENING      Allergies   Allergen Reactions    Doxycycline  Other Adverse Reaction (Add comment)     Sweats and flushing    vomiting    Duloxetine  Other Adverse Reaction (Add comment)     Unk.     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     Stomach cramps     Thromboembolic disorder (CMS HCC)     Thrombosis of mesenteric vein (CMS HCC)     Thyroid nodule      Past Surgical History:   Procedure Laterality Date    BILATERAL OOPHORECTOMY      COLONOSCOPY  2017    Dr mal    HX CHOLECYSTECTOMY      HX [place]      01/31/22    HX THYROIDECTOMY      HX TUBAL LIGATION      SKIN GRAFT      12/2020  right upper arm    SKIN LESION EXCISION      removal of pigmented skin lesion    SQUAMOUS CELL CARCINOMA EXCISION      10/2019  left shoulder and right front shoulder 03/2021  back right     Family Medical History:  Problem Relation (Age of Onset)    Aortic Aneurysm Brother    Blood Clots Mother    Breast Disease Sister    Cerebral Aneurysm Maternal Aunt    Deep vein thrombosis Sister    Heart Disease Mother    Hypertension (High Blood Pressure)  Mother    Stroke Mother    Ulcers Brother            Social History     Tobacco Use    Smoking status: Never    Smokeless tobacco: Never   Vaping Use    Vaping Use: Never used   Substance Use Topics    Alcohol use: Never    Drug use: Never       Review of Systems:  Review of Systems    Physical Exam:        Physical Exam  Constitutional:       Appearance: Normal appearance. She is well-developed, well-groomed and normal weight.   HENT:      Head: Normocephalic and atraumatic.      Right Ear: Hearing, tympanic membrane, ear canal and external ear normal.      Left Ear: Hearing, tympanic membrane, ear canal and external ear normal.      Nose: Septal deviation and mucosal edema present.      Right Turbinates: Enlarged.      Left Turbinates: Enlarged.      Mouth/Throat:      Lips: Pink.      Mouth: Mucous membranes are moist.      Pharynx: Oropharynx is clear. Uvula midline.   Eyes:      Extraocular Movements: Extraocular movements intact.   Neck:      Trachea: Phonation normal.   Pulmonary:      Effort: Pulmonary effort is normal.   Musculoskeletal:      Cervical back: Normal range of motion and neck supple.   Lymphadenopathy:      Cervical: No cervical adenopathy.   Skin:     General: Skin is warm.   Neurological:      Mental Status: She is alert and oriented to person, place, and time.      Cranial Nerves: Cranial nerves 2-12 are intact. No facial asymmetry.   Psychiatric:         Attention and Perception: Attention normal.         Mood and Affect: Mood normal.         Speech: Speech normal.         Behavior: Behavior normal. Behavior is cooperative.         Assessment:  ENCOUNTER DIAGNOSES     ICD-10-CM   1. Facial pain, atypical  G50.1   2. Chronic rhinosinusitis  J31.0    J32.9   3. Nasal turbinate hypertrophy  J34.3   4. Non-seasonal allergic rhinitis due to other allergic trigger  J30.89   5. Concha bullosa  J34.89   6. Deviated nasal septum  J34.2       Plan:  Medical records reviewed on  06/18/2022.  Reviewed/interpreted Sinus CT-- PNS clear. Cont FU with Neuro  Nasal saline b.i.d.  Continue Singulair daily    Follow-up regarding allergy treatment PRN    The advanced practice clinician's documentation was reviewed/amended in its entirety with the assessment and plan portion completely performed independently by me during this encounter.    Jo Murdoch, DO      Jo Wong, Jo Wong  06/18/2022, 10:04

## 2022-06-19 ENCOUNTER — Other Ambulatory Visit: Payer: Commercial Managed Care - PPO | Attending: NEUROLOGY

## 2022-06-19 ENCOUNTER — Other Ambulatory Visit: Payer: Self-pay

## 2022-06-19 DIAGNOSIS — G500 Trigeminal neuralgia: Secondary | ICD-10-CM | POA: Insufficient documentation

## 2022-06-19 LAB — COMPREHENSIVE METABOLIC PANEL, NON-FASTING
ALBUMIN/GLOBULIN RATIO: 1.9 — ABNORMAL HIGH (ref 0.8–1.4)
ALBUMIN: 4.4 g/dL (ref 3.5–5.7)
ALKALINE PHOSPHATASE: 98 U/L (ref 34–104)
ALT (SGPT): 16 U/L (ref 7–52)
ANION GAP: 7 mmol/L (ref 4–13)
AST (SGOT): 16 U/L (ref 13–39)
BILIRUBIN TOTAL: 0.4 mg/dL (ref 0.3–1.2)
BUN/CREA RATIO: 24 — ABNORMAL HIGH (ref 6–22)
BUN: 25 mg/dL (ref 7–25)
CALCIUM, CORRECTED: 9.2 mg/dL (ref 8.9–10.8)
CALCIUM: 9.6 mg/dL (ref 8.6–10.3)
CHLORIDE: 109 mmol/L — ABNORMAL HIGH (ref 98–107)
CO2 TOTAL: 27 mmol/L (ref 21–31)
CREATININE: 1.04 mg/dL (ref 0.60–1.30)
ESTIMATED GFR: 59 mL/min/{1.73_m2} — ABNORMAL LOW (ref >59)
GLOBULIN: 2.3 — ABNORMAL LOW (ref 2.9–5.4)
GLUCOSE: 73 mg/dL — ABNORMAL LOW (ref 74–109)
OSMOLALITY, CALCULATED: 288 mOsm/kg (ref 270–290)
POTASSIUM: 4.7 mmol/L (ref 3.5–5.1)
PROTEIN TOTAL: 6.7 g/dL (ref 6.4–8.9)
SODIUM: 143 mmol/L (ref 136–145)

## 2022-06-19 LAB — CYCLIC CITRULLINATED PEPTIDE ANTIBODIES, IGG, SERUM
CYCLIC CITRULLINATED PEPTIDE ANTIBODY IGG QUAL: NEGATIVE
CYCLIC CITRULLINATED PEPTIDE ANTIBODY IGG QUANT: <0.5 U/mL (ref <3.0)

## 2022-06-19 LAB — MONOCLONAL GAMMOPATHY PROFILE WITH IMMUNOTYPING REFLEX
ALBUMIN: 4.3 g/dL (ref 3.4–4.8)
KAPPA FREE LIGHT CHAINS: 1.88 mg/dL (ref 0.33–1.94)
KAPPA/LAMBDA FLC RATIO: 1.2 (ref 0.26–1.65)
LAMBDA FREE LIGHT CHAINS: 1.57 mg/dL (ref 0.57–2.63)
TOTAL PROTEIN: 6.7 g/dL (ref 5.6–7.6)

## 2022-06-19 LAB — ALBUMIN FOR ELECTROPHORESIS: ALBUMIN: 4.3 g/dL (ref 3.4–4.8)

## 2022-06-19 LAB — KAPPA AND LAMBDA FREE LIGHT CHAINS, SERUM
KAPPA FREE LIGHT CHAINS: 1.88 mg/dL (ref 0.33–1.94)
KAPPA/LAMBDA FLC RATIO: 1.2 (ref 0.26–1.65)
LAMBDA FREE LIGHT CHAINS: 1.57 mg/dL (ref 0.57–2.63)

## 2022-06-19 LAB — SS-B/LA ANTIBODIES, IGG, SERUM: SS-B/LA ANTIBODIES, IGG, QUALITATIVE: NEGATIVE

## 2022-06-19 LAB — SS-A/RO ANTIBODIES, IGG, SERUM: SS-A/RO, ANTIBODIES, IGG QUALITATIVE: NEGATIVE

## 2022-06-19 LAB — RHEUMATOID FACTOR, SERUM: RHEUMATOID FACTOR: <13 IU/mL (ref <=30)

## 2022-06-19 LAB — PROTEIN FOR ELECTROPHORESIS: PROTEIN TOTAL: 6.7 g/dL (ref 5.6–7.6)

## 2022-06-19 NOTE — Procedures (Signed)
NEUROLOGY, [facility]  [place] [address]    Procedure Note    Name: TIFFANEY HEIMANN MRN:  [id number]   Date: 06/18/2022 Age: 66 y.o.  DOB:   June 29, 1956       Procedures    ELECTROMYOGRAPHY REPORT    A total of 8 nerves (5 in the upper extremity and 3 in the lower extremity) were just to be nerve conduction.  A total of 5 muscles (2 in the upper extremity and 3 in the lower extremity) were tested by electromyography.    DATE OF SERVICE:  06/18/22      PERFORMING PHYSICIAN:  Garen Lah, DO      REFERRING PHYSICIAN:  Garen Lah, DO    HISTORY:  She is a 66 year old woman who is referred for right-sided facial pain and extremity paresthesias.  See initial visit note from June 17, 2022 for full history.    EXAMINATION:  See initial visit note from June 17, 2022 for detailed examination.    NERVE CONDUCTION STUDIES:  1.Normal right median-D2, ulnar-D5, radial-snuff box, and sural sensory nerve action potentials (SNAPs).  2.Normal right median-APB, ulnar-ADM, fibular-EDB, fibular-TA and tibial-AH compound muscle action potentials (CMAPs).  3.       Blink testing reveals diminished right trigeminal nerve amplitude compared to left, however, latencies remain normal.    ELECTROMYOGRAPHY:  Normal studies for the right deltoid, 1st dorsal interosseous, vastus lateralis, medial gastrocnemius, tibialis anterior.      IMPRESSION:  This is a mildly abnormal study.  While trigeminal nerve amplitudes were not directly compared via nerve conduction, it was apparent that amplitudes during blink testing were reduced on the right side compared to the left the latency comparisons remained within normal limits.    There was no electrophysiologic evidence for a large fiber polyneuropathy or cervical or lumbosacral radiculopathy affecting the right side at this time.      CLINICAL NOTE:  The above findings were discussed with the patient.  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  We will continue with the plan to obtain an MRI of the brain as well as MRA of the head.  I am still awaiting the bulk of her laboratory results but so far everything has returned normal.  We will await the results of her MRIs before starting a sodium channel agent.  We will plan on using Trileptal.  We discussed the use of the medication and common side effects.  We also discussed that there was no evidence of a large fiber polyneuropathy and that I suspect that her sensory symptoms in her extremities are secondary to a small fiber process.  I am still waiting on rheumatologic labs to return to see if there is something underlying that we can treat.  If not we are likely limited to symptom management.  She will return to clinic in 3 months.      Adrian Prows, DO  Assistant Professor of Neurology  [facility]

## 2022-06-20 ENCOUNTER — Encounter (INDEPENDENT_AMBULATORY_CARE_PROVIDER_SITE_OTHER): Payer: Self-pay | Admitting: NEUROLOGY

## 2022-06-20 LAB — HEP-2 SUBSTRATE ANTINUCLEAR ANTIBODIES (ANA), SERUM: ANA INTERPRETATION: NEGATIVE

## 2022-06-27 ENCOUNTER — Ambulatory Visit (INDEPENDENT_AMBULATORY_CARE_PROVIDER_SITE_OTHER): Payer: Self-pay | Admitting: NEUROLOGY

## 2022-07-16 ENCOUNTER — Other Ambulatory Visit (INDEPENDENT_AMBULATORY_CARE_PROVIDER_SITE_OTHER): Payer: Self-pay | Admitting: NEUROLOGY

## 2022-07-17 IMAGING — MR MRI BRAIN WITHOUT AND WITH CONTRAST
12 of 13 series · 40 of 48 positions shown · IV contrast (gadavist)
Comparison: None available.

﻿EXAM:  [DATE]   MRI BRAIN WITHOUT AND WITH CONTRAST,MRA HEAD WITH AND WITHOUT CONTRAST
INDICATION: Multiple cranial neuropathy, headaches, right-sided trigeminal neuralgia, right-sided numbness.
TECHNIQUE: Multiplanar, multisequence MRI was performed both before and after the intravenous administration of 10 mL Gadavist.  Thin sections were obtained through the internal auditory canals.  MR angiography was performed using a 3D time-of-flight technique with multiple maximum intensity projections.

[Series 5: DWI · axial · 5.0mm · 1.35mm/px · z∈[-54,+72]mm · 11 of 88 slices shown (1 of 3)]
[im 1/88]
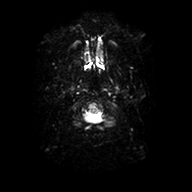
[im 8/88]
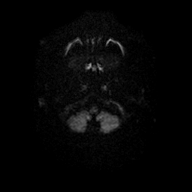
[im 16/88]
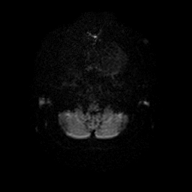
[im 24/88]
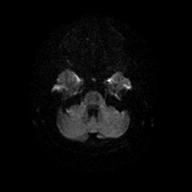
[im 32/88]
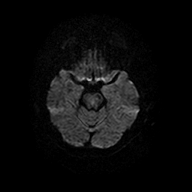
[im 40/88]
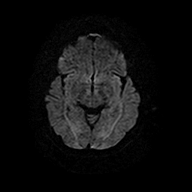
[im 48/88]
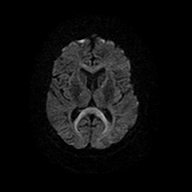
[im 64/88]
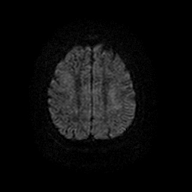
[im 72/88]
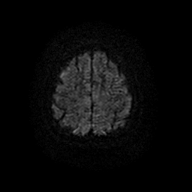
[im 80/88]
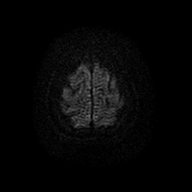
[im 88/88]
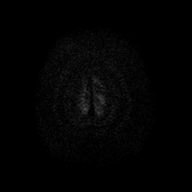

[Series 6: DWI · axial · 5.0mm · 1.35mm/px · z∈[-54,+72]mm · 4 of 22 slices shown (2 of 3)]
[im 1/22]
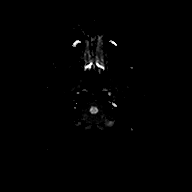
[im 8/22]
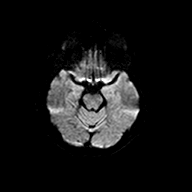
[im 15/22]
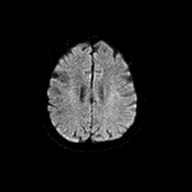
[im 22/22]
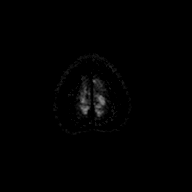

[Series 7: DWI · axial · 5.0mm · 1.35mm/px · z∈[-54,+72]mm · 4 of 22 slices shown (3 of 3)]
[im 1/22]
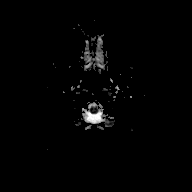
[im 8/22]
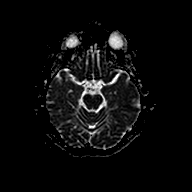
[im 15/22]
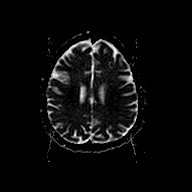
[im 22/22]
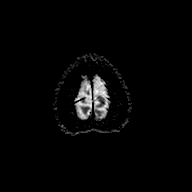

[Series 8: FLAIR · sagittal · 4.0mm · 0.75mm/px · 3 of 26 slices shown (1 of 2)]
[im 1/26]
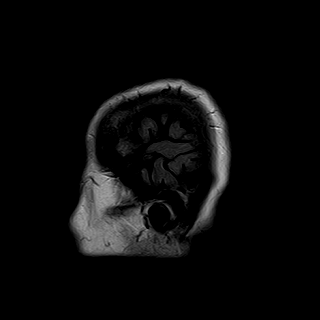
[im 13/26]
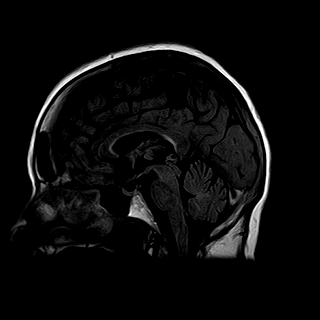
[im 26/26]
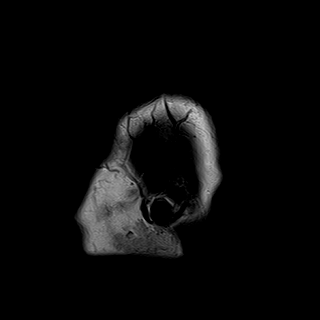

[Series 9: T2 · axial · 5.0mm · 0.43mm/px · z∈[-65,+73]mm · 3 of 24 slices shown]
[im 1/24]
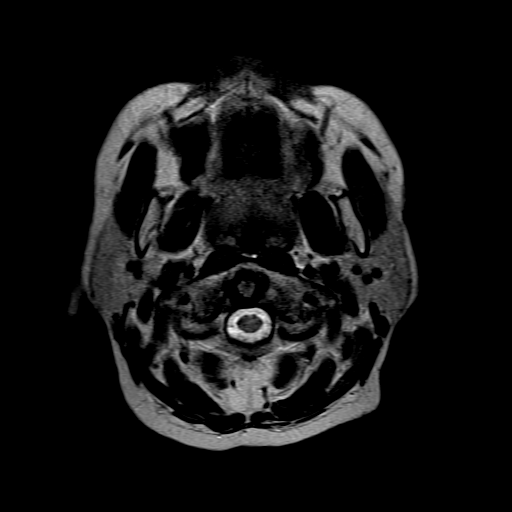
[im 12/24]
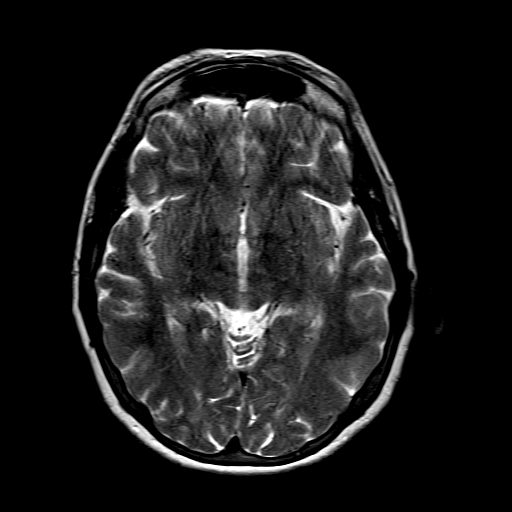
[im 24/24]
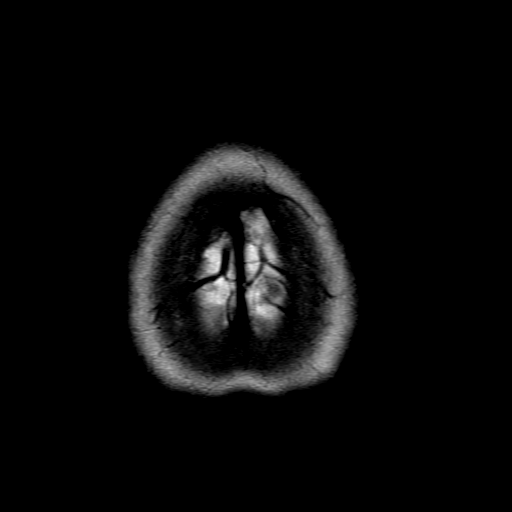

[Series 10: FLAIR · axial · 5.0mm · 0.43mm/px · z∈[-68,+76]mm · 3 of 25 slices shown (2 of 2)]
[im 1/25]
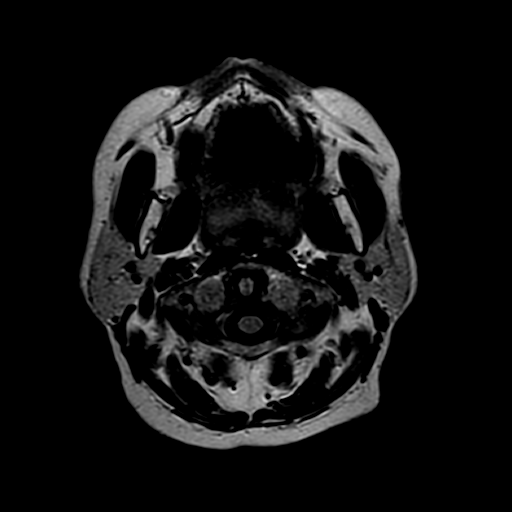
[im 13/25]
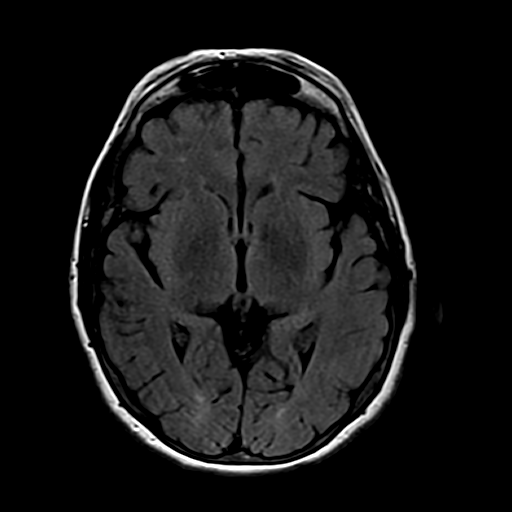
[im 25/25]
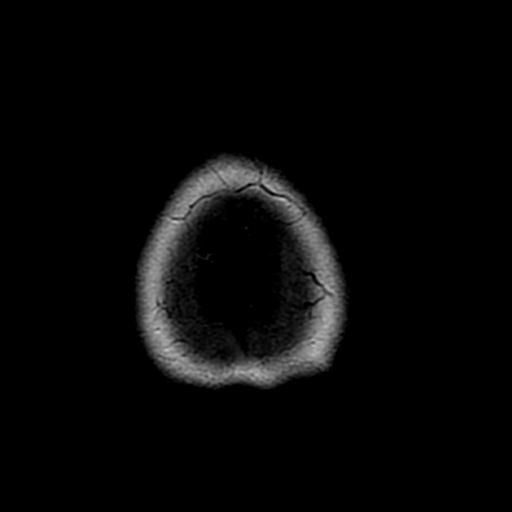

[Series 11: T2-star · axial · 5.0mm · 0.43mm/px · z∈[-68,+76]mm · 3 of 25 slices shown]
[im 1/25]
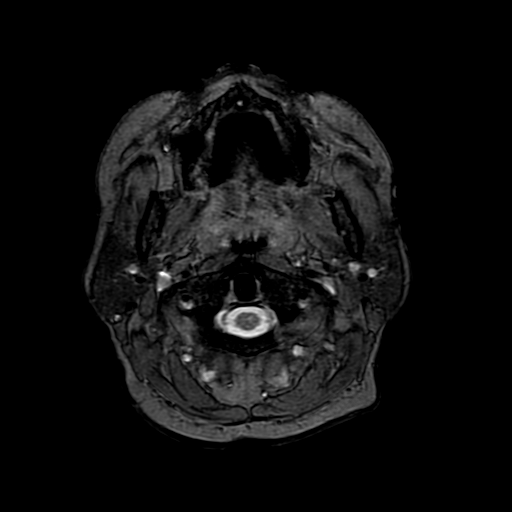
[im 13/25]
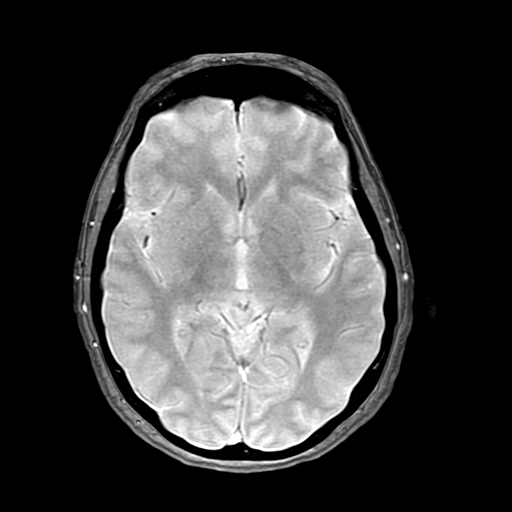
[im 25/25]
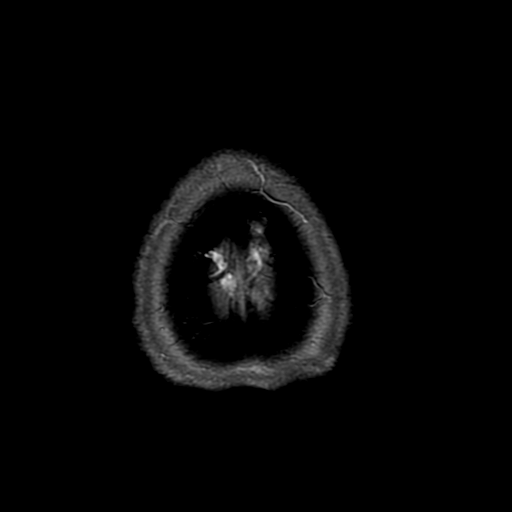

[Series 12: T1 · axial · 5.0mm · 0.43mm/px · z∈[-68,+76]mm · 3 of 25 slices shown (1 of 2)]
[im 1/25]
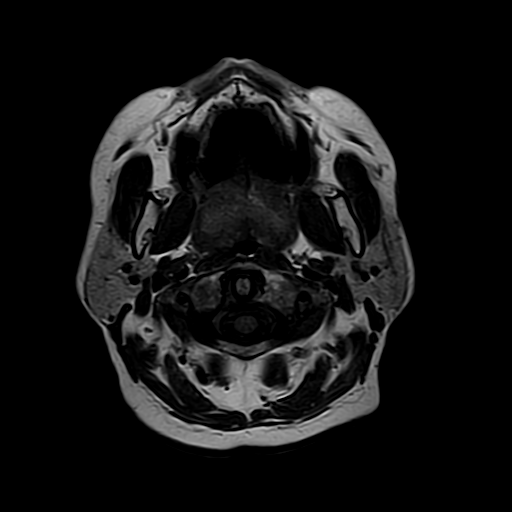
[im 13/25]
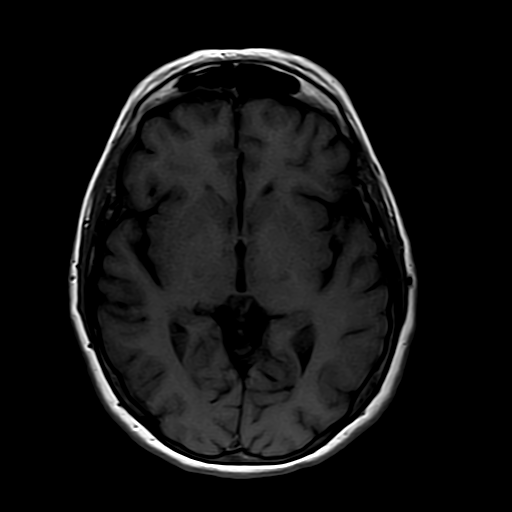
[im 25/25]
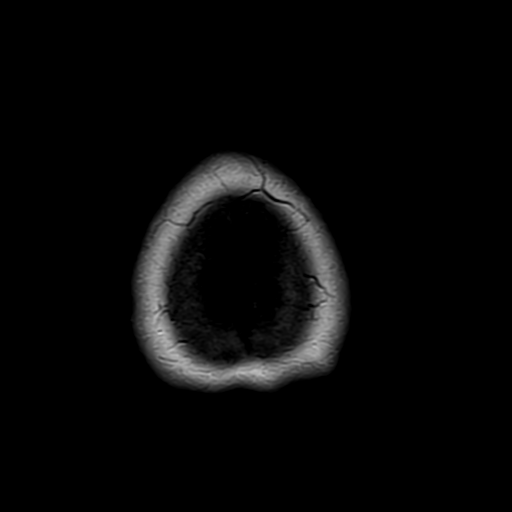

[Series 14: T1 · axial · 3.0mm · 0.47mm/px · 1 of 11 slices shown (2 of 2)]
[im 1/11]
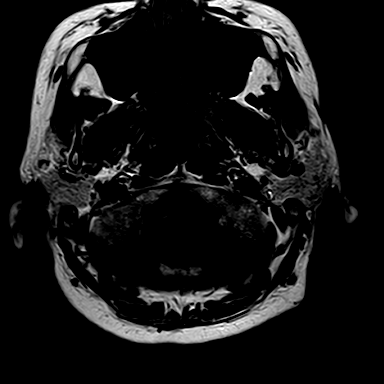

[Series 15: T1 post-contrast · axial · 3.0mm · 0.47mm/px · 1 of 11 slices shown (1 of 3)]
[im 1/11]
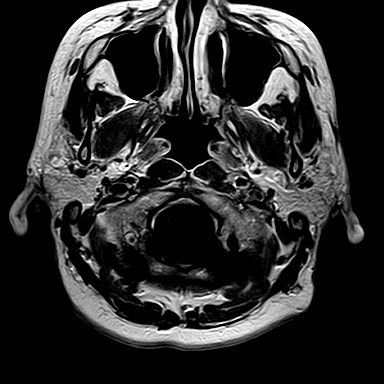

[Series 16: T1 post-contrast · coronal · 3.0mm · 0.47mm/px · 1 of 11 slices shown (2 of 3)]
[im 1/11]
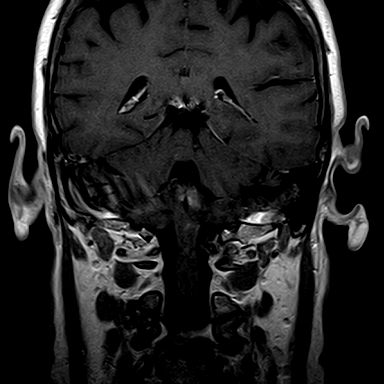

[Series 17: T1 post-contrast · axial · 5.0mm · 0.69mm/px · z∈[-59,+67]mm · 3 of 22 slices shown (3 of 3)]
[im 1/22]
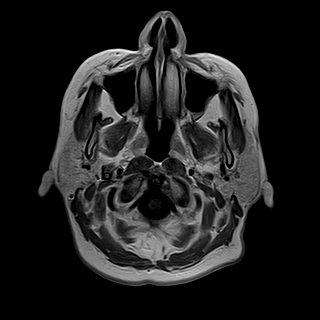
[im 11/22]
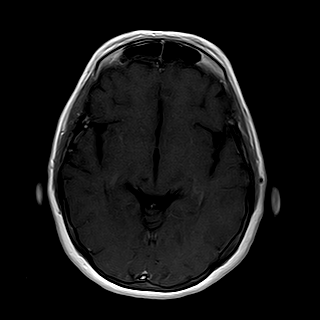
[im 22/22]
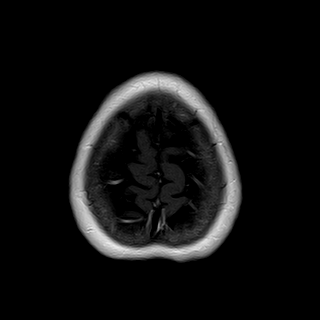

[40 of 48 positions shown; findings below may reference images not displayed]

FINDINGS: There is mild atrophy with slight prominence of the ventricles and CSF spaces.  There is mild chronic small vessel ischemic change.  

There is no evidence of mass, hemorrhage, shift of the midline structures, extra-axial collection, or abnormal enhancement.  The internal auditory canals including the 7th and 8th cranial nerves appear within normal limits.  The visualized trigeminal nerves also appear unremarkable. 

MR angiography demonstrates no evidence of aneurysm, vascular malformation, or major vascular occlusion. 

The paranasal sinuses and mastoid air cells appear clear.
IMPRESSION: 1. Mild atrophy. 

2. Mild chronic small vessel ischemic change.  

3. Otherwise, unremarkable examination.

## 2022-07-17 IMAGING — MR MRA HEAD WITH AND WITHOUT CONTRAST
5 of 7 series · 16 of 48 positions shown · IV contrast (gadavist)
Comparison: None available.

﻿EXAM:  [DATE]   MRI BRAIN WITHOUT AND WITH CONTRAST,MRA HEAD WITH AND WITHOUT CONTRAST
INDICATION: Multiple cranial neuropathy, headaches, right-sided trigeminal neuralgia, right-sided numbness.
TECHNIQUE: Multiplanar, multisequence MRI was performed both before and after the intravenous administration of 10 mL Gadavist.  Thin sections were obtained through the internal auditory canals.  MR angiography was performed using a 3D time-of-flight technique with multiple maximum intensity projections.

[Series 5: DWI · axial · 5.0mm · 1.35mm/px · z∈[-47,+79]mm · 8 of 88 slices shown (1 of 3)]
[im 1/88]
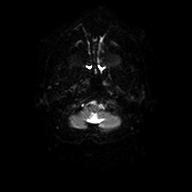
[im 13/88]
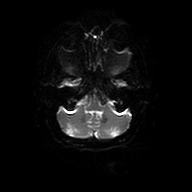
[im 25/88]
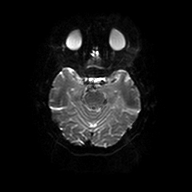
[im 38/88]
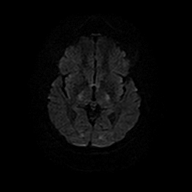
[im 50/88]
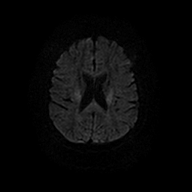
[im 63/88]
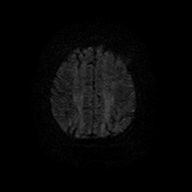
[im 75/88]
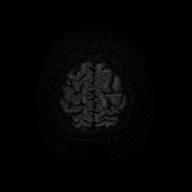
[im 88/88]
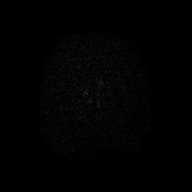

[Series 6: DWI · axial · 5.0mm · 1.35mm/px · z∈[-47,+79]mm · 2 of 22 slices shown (2 of 3)]
[im 1/22]
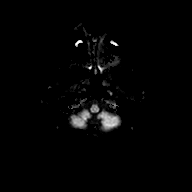
[im 22/22]
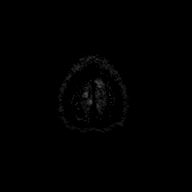

[Series 7: DWI · axial · 5.0mm · 1.35mm/px · z∈[-47,+79]mm · 2 of 22 slices shown (3 of 3)]
[im 1/22]
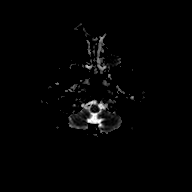
[im 22/22]
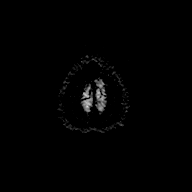

[Series 8: FLAIR · sagittal · 4.0mm · 0.75mm/px · 2 of 26 slices shown]
[im 1/26]
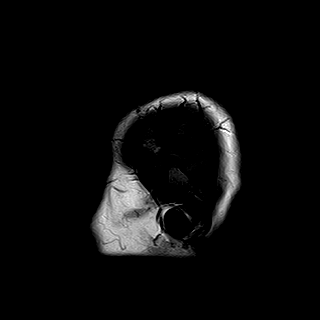
[im 26/26]
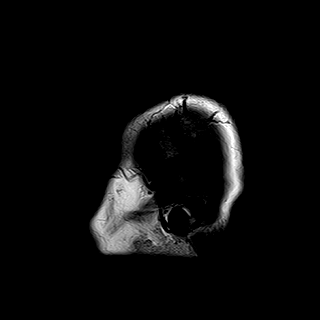

[Series 9: T2 · axial · 5.0mm · 0.43mm/px · z∈[-57,+87]mm · 2 of 25 slices shown]
[im 1/25]
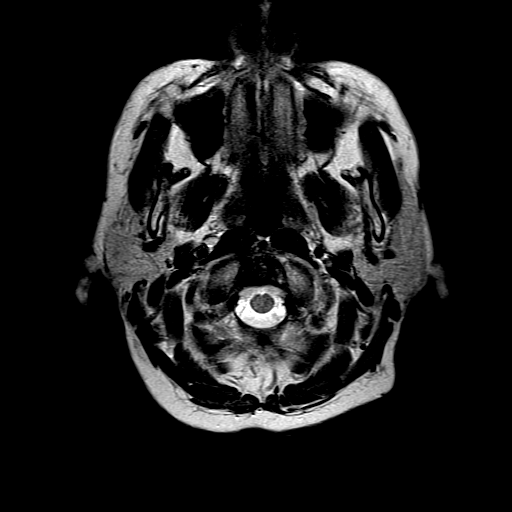
[im 25/25]
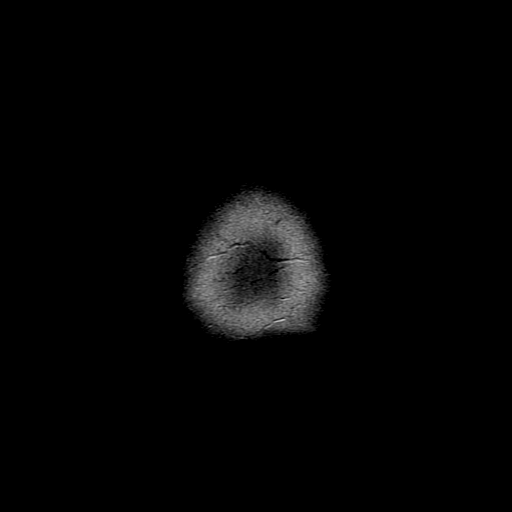

[16 of 48 positions shown; findings below may reference images not displayed]

FINDINGS: There is mild atrophy with slight prominence of the ventricles and CSF spaces.  There is mild chronic small vessel ischemic change.  

There is no evidence of mass, hemorrhage, shift of the midline structures, extra-axial collection, or abnormal enhancement.  The internal auditory canals including the 7th and 8th cranial nerves appear within normal limits.  The visualized trigeminal nerves also appear unremarkable. 

MR angiography demonstrates no evidence of aneurysm, vascular malformation, or major vascular occlusion. 

The paranasal sinuses and mastoid air cells appear clear.
IMPRESSION: 1. Mild atrophy. 

2. Mild chronic small vessel ischemic change.  

3. Otherwise, unremarkable examination.

## 2022-07-30 ENCOUNTER — Telehealth (INDEPENDENT_AMBULATORY_CARE_PROVIDER_SITE_OTHER): Payer: Self-pay

## 2022-07-30 ENCOUNTER — Encounter (INDEPENDENT_AMBULATORY_CARE_PROVIDER_SITE_OTHER): Payer: Self-pay | Admitting: NEUROLOGY

## 2022-07-30 MED ORDER — OXCARBAZEPINE 150 MG TABLET
150.0000 mg | ORAL_TABLET | Freq: Two times a day (BID) | ORAL | 2 refills | Status: DC
Start: 2022-07-30 — End: 2022-10-24

## 2022-07-30 NOTE — Nursing Note (Signed)
Pt called asking if you have received her MRI results from a few weeks ago and what the results are if so? She is also stating that she left the CD of this MRI with Kim/Taylor last Monday. She hasn't heard anything. Can you please notify her of results or what I should tell her?

## 2022-07-30 NOTE — Telephone Encounter (Addendum)
MRI is without evidence of inflammatory lesions or anything else of concern. No vascular causes of trigeminal neuralgia seen. Will start a trial of Trileptal 150 BID.

## 2022-08-11 ENCOUNTER — Other Ambulatory Visit: Payer: Self-pay

## 2022-08-11 ENCOUNTER — Ambulatory Visit: Payer: Commercial Managed Care - PPO | Attending: Internal Medicine | Admitting: Internal Medicine

## 2022-08-11 ENCOUNTER — Encounter (INDEPENDENT_AMBULATORY_CARE_PROVIDER_SITE_OTHER): Payer: Self-pay | Admitting: Internal Medicine

## 2022-08-11 VITALS — BP 142/82 | HR 97 | Temp 98.0°F | Ht 64.0 in | Wt 174.4 lb

## 2022-08-11 DIAGNOSIS — Z7185 Encounter for immunization safety counseling: Secondary | ICD-10-CM | POA: Insufficient documentation

## 2022-08-11 DIAGNOSIS — E782 Mixed hyperlipidemia: Secondary | ICD-10-CM | POA: Insufficient documentation

## 2022-08-11 DIAGNOSIS — E039 Hypothyroidism, unspecified: Secondary | ICD-10-CM | POA: Insufficient documentation

## 2022-08-11 DIAGNOSIS — Z23 Encounter for immunization: Secondary | ICD-10-CM | POA: Insufficient documentation

## 2022-08-11 DIAGNOSIS — I10 Essential (primary) hypertension: Secondary | ICD-10-CM | POA: Insufficient documentation

## 2022-08-11 DIAGNOSIS — G8929 Other chronic pain: Secondary | ICD-10-CM | POA: Insufficient documentation

## 2022-08-11 DIAGNOSIS — J8283 Eosinophilic asthma: Secondary | ICD-10-CM | POA: Insufficient documentation

## 2022-08-11 MED ORDER — PANTOPRAZOLE 40 MG TABLET,DELAYED RELEASE
40.0000 mg | DELAYED_RELEASE_TABLET | Freq: Every day | ORAL | 2 refills | Status: AC
Start: 2022-08-11 — End: 2022-11-09

## 2022-08-11 NOTE — Progress Notes (Signed)
Name: Jo Wong                       Date of Birth: June 10, 1956   MRN:  [id number]                         Date of visit: 08/11/2022     PCP: Delaney Meigs, PA-C     Subjective  Jo Wong is a 66 y.o. year old female who presents for Follow Up 4 Months   to clinic.  No specialty comments available.   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 11/08/2021    Headache, post-traumatic 11/08/2021    Thyroid nodule 11/08/2021    Thrombosis of mesenteric vein (CMS HCC) 16/09/930    Thromboembolic disorder (CMS [place]) 11/08/2021    Fibromyositis 11/08/2021    Multiple nodules of lung 11/08/2021    IBS (irritable bowel syndrome) 11/08/2021    Insomnia 11/08/2021    Acquired hypothyroidism 11/08/2021    Mixed hyperlipidemia 11/08/2021    Hypertension 11/08/2021    Major depressive disorder 11/08/2021    Prolapsed cervical intervertebral disc 11/08/2021    Cyst of ovary 11/08/2021    Chronic pain 11/08/2021    Generalized anxiety disorder 11/08/2021    Actinic keratosis 11/08/2021      Current Outpatient Medications   Medication Sig    albuterol sulfate (PROVENTIL HFA/VENTOLIN HFA) 90 mcg Inhalation Inhaler Take 2 Puffs by inhalation Every 6 hours as needed    benralizumab 30 mg/mL Subcutaneous Auto-Injector Inject 1 mL (30 mg total) under the skin    buPROPion (WELLBUTRIN XL) 300 mg extended release 24 hr tablet TAKE 1 TABLET EVERY MORNING    Cholecalciferol, Vitamin D3, 50 mcg (2,000 unit) Oral Capsule Take 1 Capsule by mouth Once a day    fluticasone-umeclidin-vilanter (TRELEGY ELLIPTA) 200-62.5-25 mcg Inhalation Disk with Device Take 1 INHALATION  by inhalation Once a day    gabapentin (NEURONTIN) 600 mg Oral Tablet Take 1 Tablet (600 mg total) by mouth Twice daily    levothyroxine (SYNTHROID) 50 mcg Oral Tablet TAKE 1 TABLET EVERY  DAY    losartan (COZAAR) 50 mg Oral Tablet Take 1 Tablet (50 mg total) by mouth Once a day for 90 days    mecobalamin, vitamin B12, 1,000 mcg Oral Tablet, Chewable Chew 1 Tablet (1,000 mcg total) Once a day    milnacipran (SAVELLA) 100 mg Oral Tablet Take 1 Tablet (100 mg total) by mouth Twice daily    montelukast (SINGULAIR) 10 mg Oral Tablet Take 1 Tablet (10 mg total) by mouth Every evening    nitroGLYCERIN (NITROSTAT) 0.4 mg Sublingual Tablet, Sublingual Place 1 Tablet (0.4 mg total) under the tongue Every 5 minutes as needed for Chest pain for 3 doses over 15 minutes    omega-3 fatty acid (LOVAZA) 1 gram Oral Capsule Take 1 Capsule (1 g total) by mouth Once a day    OXcarbazepine (TRILEPTAL) 150 mg Oral Tablet Take 1 Tablet (150 mg total) by mouth Twice daily for 90 days Indications: facial nerve pain    pantoprazole (PROTONIX) 40 mg Oral Tablet, Delayed Release (E.C.) 1 Tablet (40 mg total) Once a day    pravastatin (PRAVACHOL) 20 mg Oral Tablet TAKE 1 TABLET EVERY EVENING        Chronic Disease Management-AMB  Follow-up visit  Seen by  [name]  now on trileptal for trigeminal neuralgia  and  did work up reviewed       Pt says  she tries to say words and they dont come out  and she will discuss this  with him     One [place]  [place] and could not remember where she was going     Seen by ENT no new orders         Pt is down to  one  daily  of savelle due to cost  on for months and stable  but  will be going off due to cost        FU   HX SQ CELL CA  Right shoulder   sq  cell cancer removed      08/2020  and  to fu wtih  Mali  [place]ston   Jan  was her appt and   Jan  16th  area   got  big and   grew   fast          fast growing   6 weeks  Jan   20th   sore and  painful  and   Started on ab  x  20 days  than  bx    + sq  cell  Mohs   surgery  scheduled and  concerned  with lymph  nodes  [name]      Feb   28th  surgery lg area removed after nl [place]  to make sure lymph nodes were not involved     and  pt  has  wound vac      March  2nd  Pt since has had  skin graft taken from  upper  right thigh and  graft has taken to right upper  arm     Newest area on face sq cell right cheek   Oct   effudex   bid   x  28  days    and  staph infection  by looking  by derm and mupericon        getting better now   pt has large area  on face   crusted over   ( Started Oct  19th  and fu on  Nov  11th)    S/P BASAL CELL OFF ABD AND  NO OTHER  TREATMENT  2022     Hx  of Pneumona Dec  2021      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  on symbicort but still not great  will add albuterol  until she fu    meds discussed     ie xolair  or nucala  to discuss with [name]  when she has fu      On Trelegy     On fasenra    FU OSA  on cpap    with [name]   Nov  2022   pt still not sleeping well  to fu    Hx [name]    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     2020,2021, 2022   removed       Follow-up hypertension blood pressures been stable    Follow-up B12 deficiency patient is on over-the-counter B12    Follow-up hypothyroidism patient is on levothyroxine 50 mcg 1/day    Follow-up neuropathy and fibromyalgia patient is on gabapentin 600 mg twice daily  along with Savella    gabapentin  level has been don  7/21    Follow-up GERD patient is on Protonix 40 mg daily without breakthrough symptoms    Follow-up hyperlipidemia patient is on pravastatin 20 mg daily along with omega-3    Follow up  Low  vitamin D   patient is on supplement    Follow up Depression patient takes Wellbutrin 300 daily    FU IBS- C  bm currently stable            REVIEW OF SYSTEMS:   Review of Systems  General: No fever.  No chills.  No weight changes.  HEENT: No vision changes.  Cardiac: No chest pain. No palpitations.  No dizziness.  No light-headedness.  No near syncope.  Resp: No dyspnea at rest, no dyspnea on exertion; no cough or hemoptysis; no orthopnea or PND.  GI: No N/V. No melena.  No bright red blood per rectum.  Ext: No edema.  No  claudication.  Neuro: No focal weakness.  Right side of  face  numbness.  All other ROS negative.      Objective:   BP (!) 142/82 (Site: Left, Patient Position: Sitting)   Pulse 97   Temp 36.7 C (98 F) (Tympanic)   Ht 1.626 m ('5\' 4"'$ )   Wt 79.1 kg (174 lb 6.4 oz)   SpO2 96%   BMI 29.94 kg/m              PHYSICAL EXAM  Physical Exam  Gen: NAD. Alert.   HEENT: PERRL; conjunctivae clear. No JVD or carotid bruit.  Cardiac: RRR with normal S1, S2.   Lungs: Clear to auscultation bilaterally. No rales. No wheezing. No rhonchi.  Abdomen: Soft, non-tender.non-distended  nl bowel sounds    Assessment/Plan  Assessment/Plan   1. Acquired hypothyroidism    2. Chronic pain    3. Mixed hyperlipidemia    4. Primary hypertension    5. Eosinophilic asthma        Meds reviewed as well as labs.  Chart reviewed and updated.   Continue current treatment.  Keep follow-up appointment.   Vaccine hx reviewed.   Keep fu  with Dr  Tobie Poet and  ENT     Labs  05/2022  reviewed   Mri   dexa and mammogram  reviewed and discussed   Reviewed  ncs  and consultants notes appreciated     Flu shot given   rsv discussed

## 2022-09-01 ENCOUNTER — Ambulatory Visit (INDEPENDENT_AMBULATORY_CARE_PROVIDER_SITE_OTHER): Payer: Commercial Managed Care - PPO | Admitting: NEUROLOGY

## 2022-09-01 ENCOUNTER — Encounter (INDEPENDENT_AMBULATORY_CARE_PROVIDER_SITE_OTHER): Payer: Self-pay | Admitting: NEUROLOGY

## 2022-09-01 ENCOUNTER — Other Ambulatory Visit: Payer: Self-pay

## 2022-09-01 VITALS — BP 129/75 | HR 91 | Temp 97.6°F | Wt 166.2 lb

## 2022-09-01 DIAGNOSIS — G500 Trigeminal neuralgia: Secondary | ICD-10-CM

## 2022-09-01 DIAGNOSIS — G629 Polyneuropathy, unspecified: Secondary | ICD-10-CM

## 2022-09-01 DIAGNOSIS — R202 Paresthesia of skin: Secondary | ICD-10-CM

## 2022-09-01 NOTE — Progress Notes (Signed)
ASSESSMENT  TRIGEMINAL NEURALGIA:  She is a 66 year old woman who is returns to clinic for 1 year of progressive facial pain and numbness on the right side.  Symptoms started somewhat insidiously with frank numbness before becoming painful and tender to touch.  Her exam is notable for sensory deficits in V2 on the right side. We did obtain an MRI and MRA of the head which were essentially unremarkable. Nerve conduction studies revealed decrease amplitudes of the right trigeminal nerve compared to the left. Since last visit, we started Trileptal '150mg'$  bid which has significantly reduced her pain level. She still has some numbness associated with the right side of her face but the shooting pain is largely gone. We will continue Trileptal at this dose. We can consider an increase should symptoms worsen.   2.   Extremity paresthesias:  She has a longer standing history of paresthesias in the arms and legs.  These can be painful at times.  Her exam was previously notable for mostly small fiber deficits in a patchy distribution in the lower extremities calling into question possible small fiber neuropathy which does have an association with fibromyalgia.  EMG revealed no evidence of a large fiber process. The addition of trileptal has helped with some of this. Rheumatologic workup has been unremarkable. We will continue to monitor. She also has some pain that develops with repetitive tasks in right hand concerning for carpel tunnel syndrome. There was no electrophysiologic evidence of this, but conductions may be normal in mild cases. She can trial wrist bracing at night.     PLAN  1. Continue trileptal '150mg'$  bid   2.  Continue to monitor       If worsening, could consider the utility of skin biopsy to confirm presence of SFN       Trial wrist brace at night for CTS like symptoms    Return to clinic 6 months    Thank you for allowing me to participate in your patient's care and please do not hesitate to contact me for any  questions or concerns.    Adrian Prows, DO  Assistant Professor of Neurology  [facility]     ==========================================================================================================================================    NAME:  Jo Wong  DOB:  04-18-1956  VISIT DATE:  06/17/2022    CC:  Facial Numbness    Patient seen in consultation at the request of Dr. Gillermo Murdoch  History obtained from the patient and chart/records  Age of patient:  66 y.o.    INTERVAL: Started trileptal. Tolerating it well. Still numb but the shooting pain has improved quite a bit, and she only experiences this occasionally. Overall she is happy with control and states that it seems to have helped with some of her fibromyalgia pain.     HPI:   I had the pleasure of seeing your patient in neurology clinic for an outpatient consultation, who is a 66 y.o. year old female who was referred for evaluation of facial numbness.  Please allow me to summarize the history for the record.     She states that symptoms began in November 2022.  It started with numbness that began under the right eye.  She did have a red spot in the same location at the time.  She does have a history of skin cancer and was given topical chemotherapeutic agent by her dermatologist to treat this.  Despite this, the numbness persisted and then began to extend down the right  side of the face to the lip and laterally towards the ear.  Gradually it became painful and tender to touch.  She reports the even light stimulus now can cause pain on the right side of her face.  She denies any weakness.  She does report that her right eye seems to have had a very mild decline in visual acuity.  She denies any changes in taste.  She does have a previous history of headaches though nothing with similar symptoms.  She also reports more longstanding numbness and tingling in the arms and legs.  She does report a history of fibromyalgia.   She does have some lightheadedness with standing.  She does have issues with constipation and diarrhea as she has a history of IBS.    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    Headache, post-traumatic    Thyroid nodule    Thrombosis of mesenteric vein (CMS HCC)    Thromboembolic disorder (CMS HCC)    Fibromyositis    Multiple nodules of lung    IBS (irritable bowel syndrome)    Insomnia    Acquired hypothyroidism    Mixed hyperlipidemia    Hypertension    Major depressive disorder    Prolapsed cervical intervertebral disc    Cyst of ovary    Chronic pain    Generalized anxiety disorder    Actinic keratosis     Past Surgical History:   Procedure Laterality Date    BILATERAL OOPHORECTOMY      COLONOSCOPY  2017    Dr mal    HX CHOLECYSTECTOMY      HX LASER SKIN LESION REMOVAL      01/31/22    HX THYROIDECTOMY      HX TUBAL LIGATION      SKIN GRAFT      12/2020  right upper arm    SKIN LESION EXCISION      removal of pigmented skin lesion    SQUAMOUS CELL CARCINOMA EXCISION      10/2019  left shoulder and right front shoulder 03/2021  back right         Family Medical History:       Problem Relation (Age of Onset)    Aortic Aneurysm Brother    Blood Clots Mother    Breast Disease Sister    Cerebral Aneurysm Maternal Aunt    Deep vein thrombosis Sister    Heart Disease Mother    Hypertension (High Blood Pressure) Mother    Stroke Mother    Ulcers Brother            Current Outpatient Medications   Medication Sig Dispense Refill    albuterol sulfate (PROVENTIL HFA/VENTOLIN HFA) 90 mcg Inhalation Inhaler Take 2 Puffs by inhalation Every 6 hours as needed      Cholecalciferol, Vitamin D3, 50 mcg (2,000 unit) Oral Capsule Take 1 Capsule by mouth Once a day      fluticasone-umeclidin-vilanter (TRELEGY  ELLIPTA) 200-62.5-25 mcg Inhalation Disk with Device Take 1 INHALATION  by inhalation Once a day      gabapentin (NEURONTIN) 600 mg Oral Tablet Take 1 Tablet (600 mg total) by mouth Twice daily 180 Tablet 1    levothyroxine (SYNTHROID) 50 mcg Oral Tablet TAKE 1 TABLET EVERY DAY 90 Tablet 1    losartan (COZAAR) 50 mg Oral Tablet Take 1 Tablet (50 mg total)  by mouth Once a day for 90 days 90 Tablet 3    mecobalamin, vitamin B12, 1,000 mcg Oral Tablet, Chewable Chew 1 Tablet (1,000 mcg total) Once a day      milnacipran (SAVELLA) 100 mg Oral Tablet Take 1 Tablet (100 mg total) by mouth Twice daily      montelukast (SINGULAIR) 10 mg Oral Tablet Take 1 Tablet (10 mg total) by mouth Every evening      nitroGLYCERIN (NITROSTAT) 0.4 mg Sublingual Tablet, Sublingual Place 1 Tablet (0.4 mg total) under the tongue Every 5 minutes as needed for Chest pain for 3 doses over 15 minutes      omega-3 fatty acid (LOVAZA) 1 gram Oral Capsule Take 1 Capsule (1 g total) by mouth Once a day      OXcarbazepine (TRILEPTAL) 150 mg Oral Tablet Take 1 Tablet (150 mg total) by mouth Twice daily for 90 days Indications: facial nerve pain 60 Tablet 2    pantoprazole (PROTONIX) 40 mg Oral Tablet, Delayed Release (E.C.) Take 1 Tablet (40 mg total) by mouth Once a day for 90 days 90 Tablet 2    pravastatin (PRAVACHOL) 20 mg Oral Tablet TAKE 1 TABLET EVERY EVENING 90 Tablet 1     No current facility-administered medications for this visit.     Allergies   Allergen Reactions    Doxycycline  Other Adverse Reaction (Add comment)     Sweats and flushing    vomiting    Duloxetine  Other Adverse Reaction (Add comment)     Unk.     Social History     Socioeconomic History    Marital status: Married     Spouse name: Not on file    Number of children: Not on file    Years of education: Not on file    Highest education level: Not on file   Occupational History    Not on file   Tobacco Use    Smoking status: Never    Smokeless tobacco: Never   Vaping Use     Vaping Use: Never used   Substance and Sexual Activity    Alcohol use: Never    Drug use: Never    Sexual activity: Not on file   Other Topics Concern    Not on file   Social History Narrative    Not on file     Social Determinants of Health     Financial Resource Strain: Low Risk  (03/04/2022)    Financial Resource Strain     SDOH Financial: No   Transportation Needs: Low Risk  (03/04/2022)    Transportation Needs     SDOH Transportation: No   Social Connections: Low Risk  (03/04/2022)    Social Connections     SDOH Social Isolation: 5 or more times a week   Intimate Partner Violence: Christmas  (03/04/2022)    Intimate Partner Violence     SDOH Domestic Violence: No   Housing Stability: [place]  (03/04/2022)    Housing Stability     SDOH Housing Situation: I have housing.     SDOH Housing Worry: No       ============================================================================================================================================  GENERAL EXAMINATION  BP 129/75 (Site: Left, Patient Position: Sitting, Cuff Size: Adult)   Pulse 91   Temp 36.4 C (97.6 F) (Temporal)   Wt 75.4 kg (166 lb 3.2 oz)   SpO2 90%   BMI 28.53 kg/m   Vital signs personally reviewed  General: No acute distress, alert  HEENT: Normocephalic, no scleral icterus  Extremities: No significant edema, No cyanosis    NEUROLOGIC EXAM  On neurological exam, patient was awake, alert and answering questions appropriately  Speech was fluent, without dysarthria or aphasia.    CN  II:  Visual fields intact to confrontation  III, IV, VI: extraocular movements intact without nystagmus  V:  Diminished over  V2 distribution on the right  VII: face symmetric without weakness  VIII: grossly intact  IX, X: symmetric palatal elevation  XI: normal strength of trapezius and sternocleidomastoid bilaterally  XII: tongue midline with full movements    MOTOR  Bulk: normal  Abnormal Movements: none    Strength:     MRC Grading Scale   Right Left   Deltoid 5 5    Biceps 5 5   Triceps 5 5   Wrist Extension - -   Wrist Flexion - -   Finger Extension - -   Finger Abduction - -   Finger Flexion - -   Hip Flexion 5 5   Hip Extension - -   Hip Abduction - -   Hip Adduction - -   Knee Extension 5 5   Knee Flexion 5 5   Ankle Dorsiflexion - -   Ankle Plantarflexion - -   Toe Extension - -   Toe Flexion - -             REFLEXES   Right Left   Biceps 2 2   Triceps 2 2   Brachioradialis 2 2   Patellar 2 2   Achilles 1 1   Plantar - -   Hoffman - -   Pectoralis - -   Jaw Jerk - -       SENSORY  Light touch: intact throughout all extremities    GAIT  General: casual, normal gait    ================================================================================================================================LABS  Personal Review of prior labs is notable for:   2023  Hepatitis panel negative  Vitamin-D within normal limits   LDL 127   TSH within normal limits   CMP largely within normal limits   CBC largely within normal limits  SSA/B antibodies negative   Rheumatoid factor negative   Anti CCP antibody negative   ANA negative   SPEP within normal limits   B12 greater than 1500   A1c 4.9  IMAGING  Personal Review of imaging is notable for:    MRI Brain w/o contrast 07/17/2022:  Mild degree of atrophy likely in keeping with age otherwise essentially unremarkable study   MRA Head July 17, 2022:  No vascular compression of the trigeminal nerve on the right  OTHER DIAGNOSTICS  Personal Review of other prior diagnostics is notable for:  Not applicable

## 2022-09-02 NOTE — Telephone Encounter (Signed)
From: Robyn Haber  To: Garnette Scheuermann  Sent: 09/01/2022 8:58 AM EST  Subject: Copy of MRA Result    Here is a copy of the MRA results

## 2022-09-23 ENCOUNTER — Encounter (INDEPENDENT_AMBULATORY_CARE_PROVIDER_SITE_OTHER): Payer: Self-pay | Admitting: Internal Medicine

## 2022-09-25 ENCOUNTER — Other Ambulatory Visit (INDEPENDENT_AMBULATORY_CARE_PROVIDER_SITE_OTHER): Payer: Self-pay | Admitting: Internal Medicine

## 2022-09-25 MED ORDER — MILNACIPRAN 100 MG TABLET
100.0000 mg | ORAL_TABLET | Freq: Two times a day (BID) | ORAL | 0 refills | Status: DC
Start: 2022-09-25 — End: 2022-10-24

## 2022-10-23 ENCOUNTER — Encounter (INDEPENDENT_AMBULATORY_CARE_PROVIDER_SITE_OTHER): Payer: Self-pay | Admitting: NEUROLOGY

## 2022-10-23 ENCOUNTER — Encounter (INDEPENDENT_AMBULATORY_CARE_PROVIDER_SITE_OTHER): Payer: Self-pay | Admitting: Internal Medicine

## 2022-10-24 ENCOUNTER — Other Ambulatory Visit (INDEPENDENT_AMBULATORY_CARE_PROVIDER_SITE_OTHER): Payer: Self-pay | Admitting: Internal Medicine

## 2022-10-24 MED ORDER — MILNACIPRAN 100 MG TABLET
100.0000 mg | ORAL_TABLET | Freq: Two times a day (BID) | ORAL | 2 refills | Status: DC
Start: 2022-10-24 — End: 2023-03-25

## 2022-10-24 MED ORDER — LOSARTAN 50 MG TABLET
50.0000 mg | ORAL_TABLET | Freq: Every day | ORAL | 2 refills | Status: DC
Start: 2022-10-24 — End: 2023-06-01

## 2022-10-24 MED ORDER — BUPROPION HCL XL 300 MG 24 HR TABLET, EXTENDED RELEASE: 300.0000 mg | ORAL_TABLET | Freq: Every day | ORAL | 2 refills | Status: DC

## 2022-10-24 MED ORDER — OXCARBAZEPINE 150 MG TABLET
150.0000 mg | ORAL_TABLET | Freq: Two times a day (BID) | ORAL | 2 refills | Status: DC
Start: 2022-10-24 — End: 2022-11-13

## 2022-11-13 ENCOUNTER — Other Ambulatory Visit (INDEPENDENT_AMBULATORY_CARE_PROVIDER_SITE_OTHER): Payer: Self-pay | Admitting: NEUROLOGY

## 2022-11-13 MED ORDER — OXCARBAZEPINE 150 MG TABLET
150.0000 mg | ORAL_TABLET | Freq: Two times a day (BID) | ORAL | 1 refills | Status: DC
Start: 2022-11-13 — End: 2023-01-30

## 2022-12-15 ENCOUNTER — Encounter (INDEPENDENT_AMBULATORY_CARE_PROVIDER_SITE_OTHER): Payer: Self-pay | Admitting: Internal Medicine

## 2022-12-15 ENCOUNTER — Other Ambulatory Visit: Payer: Self-pay

## 2022-12-15 ENCOUNTER — Ambulatory Visit: Payer: Commercial Managed Care - PPO | Attending: Internal Medicine | Admitting: Internal Medicine

## 2022-12-15 VITALS — BP 122/84 | HR 100 | Temp 97.4°F | Ht 64.0 in | Wt 170.8 lb

## 2022-12-15 DIAGNOSIS — M797 Fibromyalgia: Secondary | ICD-10-CM | POA: Insufficient documentation

## 2022-12-15 DIAGNOSIS — E039 Hypothyroidism, unspecified: Secondary | ICD-10-CM | POA: Insufficient documentation

## 2022-12-15 DIAGNOSIS — I749 Embolism and thrombosis of unspecified artery: Secondary | ICD-10-CM | POA: Insufficient documentation

## 2022-12-15 DIAGNOSIS — I10 Essential (primary) hypertension: Secondary | ICD-10-CM | POA: Insufficient documentation

## 2022-12-15 DIAGNOSIS — E782 Mixed hyperlipidemia: Secondary | ICD-10-CM | POA: Insufficient documentation

## 2022-12-15 LAB — CBC WITH DIFF
BASOPHIL #: 0 10*3/uL (ref 0.00–0.10)
BASOPHIL %: 0 % (ref 0–1)
EOSINOPHIL #: 0 10*3/uL (ref 0.00–0.50)
EOSINOPHIL %: 1 %
HCT: 45.3 % — ABNORMAL HIGH (ref 31.2–41.9)
HGB: 15.3 g/dL — ABNORMAL HIGH (ref 10.9–14.3)
LYMPHOCYTE #: 1 10*3/uL (ref 1.00–3.00)
LYMPHOCYTE %: 21 % (ref 16–44)
MCH: 28.5 pg (ref 24.7–32.8)
MCHC: 33.9 g/dL (ref 32.3–35.6)
MCV: 84.3 fL (ref 75.5–95.3)
MONOCYTE #: 0.4 10*3/uL (ref 0.30–1.00)
MONOCYTE %: 9 % (ref 5–13)
MPV: 8.6 fL (ref 7.9–10.8)
NEUTROPHIL #: 3.3 10*3/uL (ref 1.85–7.80)
NEUTROPHIL %: 69 % (ref 43–77)
PLATELETS: 211 10*3/uL (ref 140–440)
RBC: 5.37 10*6/uL — ABNORMAL HIGH (ref 3.63–4.92)
RDW: 13.5 % (ref 12.3–17.7)
WBC: 4.8 10*3/uL (ref 3.8–11.8)

## 2022-12-15 LAB — LIPID PANEL
CHOL/HDL RATIO: 4.6
CHOLESTEROL: 194 mg/dL (ref <200)
HDL CHOL: 42 mg/dL (ref 23–92)
LDL CALC: 90 mg/dL (ref 0–100)
TRIGLYCERIDES: 310 mg/dL — ABNORMAL HIGH (ref <=150)
VLDL CALC: 62 mg/dL — ABNORMAL HIGH (ref 0–50)

## 2022-12-15 LAB — COMPREHENSIVE METABOLIC PNL, FASTING
ALBUMIN/GLOBULIN RATIO: 1.8 — ABNORMAL HIGH (ref 0.8–1.4)
ALBUMIN: 4.4 g/dL (ref 3.5–5.7)
ALKALINE PHOSPHATASE: 115 U/L — ABNORMAL HIGH (ref 34–104)
ALT (SGPT): 22 U/L (ref 7–52)
ANION GAP: 7 mmol/L (ref 4–13)
AST (SGOT): 22 U/L (ref 13–39)
BILIRUBIN TOTAL: 0.4 mg/dL (ref 0.3–1.2)
BUN/CREA RATIO: 18 (ref 6–22)
BUN: 16 mg/dL (ref 7–25)
CALCIUM, CORRECTED: 9.4 mg/dL (ref 8.9–10.8)
CALCIUM: 9.7 mg/dL (ref 8.6–10.3)
CHLORIDE: 105 mmol/L (ref 98–107)
CO2 TOTAL: 27 mmol/L (ref 21–31)
CREATININE: 0.89 mg/dL (ref 0.60–1.30)
ESTIMATED GFR: 71 mL/min/{1.73_m2} (ref >59)
GLOBULIN: 2.4 — ABNORMAL LOW (ref 2.9–5.4)
GLUCOSE: 88 mg/dL (ref 74–109)
OSMOLALITY, CALCULATED: 278 mOsm/kg (ref 270–290)
POTASSIUM: 4.6 mmol/L (ref 3.5–5.1)
PROTEIN TOTAL: 6.8 g/dL (ref 6.4–8.9)
SODIUM: 139 mmol/L (ref 136–145)

## 2022-12-15 LAB — THYROID STIMULATING HORMONE (SENSITIVE TSH): TSH: 3.454 u[IU]/mL (ref 0.450–5.330)

## 2022-12-15 MED ORDER — PNEUMOCOCCAL 20-VALENT CONJ VACCINE-DIP CRM (PF) 0.5 ML IM SYRINGE
0.5000 mL | INJECTION | Freq: Once | INTRAMUSCULAR | 0 refills | Status: AC
Start: 2022-12-15 — End: 2022-12-15

## 2022-12-15 MED ORDER — GABAPENTIN 600 MG TABLET
600.0000 mg | ORAL_TABLET | Freq: Two times a day (BID) | ORAL | 1 refills | Status: DC
Start: 2022-12-15 — End: 2023-08-12

## 2022-12-15 NOTE — Progress Notes (Signed)
Name: Jo Wong                       Date of Birth: Dec 06, 1955   MRN:  [id number]                         Date of visit: 12/15/2022     PCP: Delaney Meigs, PA-C     Subjective  Jo Wong is a 67 y.o. year old female who presents for Follow Up 4 Months (No problems)   to clinic.  No specialty comments available.   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 11/08/2021    Headache, post-traumatic 11/08/2021    Thyroid nodule 11/08/2021    Thrombosis of mesenteric vein (CMS HCC) 123XX123    Thromboembolic disorder (CMS [place]) 11/08/2021    Fibromyositis 11/08/2021    Multiple nodules of lung 11/08/2021    IBS (irritable bowel syndrome) 11/08/2021    Insomnia 11/08/2021    Acquired hypothyroidism 11/08/2021    Mixed hyperlipidemia 11/08/2021    Hypertension 11/08/2021    Major depressive disorder 11/08/2021    Prolapsed cervical intervertebral disc 11/08/2021    Cyst of ovary 11/08/2021    Chronic pain 11/08/2021    Generalized anxiety disorder 11/08/2021    Actinic keratosis 11/08/2021      Current Outpatient Medications   Medication Sig    albuterol sulfate (PROVENTIL HFA/VENTOLIN HFA) 90 mcg Inhalation Inhaler Take 2 Puffs by inhalation Every 6 hours as needed    buPROPion (WELLBUTRIN XL) 300 mg extended release 24 hr tablet Take 1 Tablet (300 mg total) by mouth Once a day    Cholecalciferol, Vitamin D3, 50 mcg (2,000 unit) Oral Capsule Take 1 Capsule by mouth Once a day    fluticasone-umeclidin-vilanter (TRELEGY ELLIPTA) 200-62.5-25 mcg Inhalation Disk with Device Take 1 INHALATION  by inhalation Once a day (Patient not taking: Reported on 12/15/2022)    gabapentin (NEURONTIN) 600 mg Oral Tablet Take 1 Tablet (600 mg total) by mouth Twice daily    levothyroxine  (SYNTHROID) 50 mcg Oral Tablet TAKE 1 TABLET EVERY DAY    losartan (COZAAR) 50 mg Oral Tablet Take 1 Tablet (50 mg total) by mouth Once a day for 90 days    mecobalamin, vitamin B12, 1,000 mcg Oral Tablet, Chewable Chew 1 Tablet (1,000 mcg total) Once a day    milnacipran (SAVELLA) 100 mg Oral Tablet Take 1 Tablet (100 mg total) by mouth Twice daily    montelukast (SINGULAIR) 10 mg Oral Tablet Take 1 Tablet (10 mg total) by mouth Every evening    nitroGLYCERIN (NITROSTAT) 0.4 mg Sublingual Tablet, Sublingual Place 1 Tablet (0.4 mg total) under the tongue Every 5 minutes as needed for Chest pain for 3 doses over 15 minutes    omega-3 fatty acid (LOVAZA) 1 gram Oral Capsule Take 1 Capsule (1 g total) by mouth Once a day    OXcarbazepine (TRILEPTAL) 150 mg Oral Tablet Take 1 Tablet (150 mg total) by mouth Twice daily for 180 days    pravastatin (PRAVACHOL) 20 mg Oral Tablet TAKE 1 TABLET EVERY EVENING    RSVPreF3 antigen-AS01E, PF, (AREXVY, PF,) 120 mcg/0.5 mL IntraMUSCULAR Suspension for Reconstitution Inject 0.5 mL into the muscle One time for 1 dose  Chronic Disease Management-AMB  Follow-up visit    Seen by  [name]  now on trileptal for trigeminal neuralgia  and  did work up reviewed       Pt says  she tries to say words and they dont come out  and she will discuss this  with him     One [place]  [place] and could not remember where she was going   On trileptal  150 bid and may be going  up per pt       FU   HX SQ CELL CA  Right shoulder   sq  cell cancer removed      08/2020  and  to fu wtih  Mali  [place]   Jan  was her appt and   Jan  16th  area   got  big and   grew   fast          fast growing   6 weeks  Jan   20th   sore and  painful  and   Started on ab  x  20 days  than  bx    + sq  cell  Mohs   surgery  scheduled and  concerned  with lymph  nodes  [name]      Feb   28th  surgery lg area removed after nl [place]  to make sure lymph nodes were not involved     and  pt has  wound vac      March   2nd  Pt since has had  skin graft taken from  upper  right thigh and  graft has taken to right upper  arm     Newest area on face sq cell right cheek   Oct   effudex   bid   x  28  days    and  staph infection  by looking  by derm and mupericon        getting better now   pt has large area  on face   crusted over   ( Started Oct  19th  and fu on  Nov  11th)    S/P BASAL CELL OFF ABD AND  NO OTHER  TREATMENT  2022     Hx  of Pneumona Dec  2021      Fu eosinophila  asthma sees [name]  on symbicort but still not great  will add albuterol  until she fu    meds discussed     ie xolair  or nucala  to discuss with [name]  when she has fu      On Trelegy at one point  but  due to cost  she is off      On fasenra    FU OSA  on cpap    with [name]   Nov  2022   pt still not sleeping well  to fu    Hx [name]    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     2020,2021, 2022   removed     Feb  2024  last visit  all good        Follow-up hypertension blood pressures been stable    Follow-up B12 deficiency patient is on over-the-counter B12    Follow-up hypothyroidism patient is on levothyroxine 50 mcg 1/day    Follow-up neuropathy and fibromyalgia patient is on gabapentin 600  mg twice daily along with Savella    gabapentin  level has been don  7/21    Follow-up GERD patient is on Protonix 40 mg daily without breakthrough symptoms    Follow-up hyperlipidemia patient is on pravastatin 20 mg daily along with omega-3    Follow up  Low  vitamin D   patient is on supplement    Follow up Depression patient takes Wellbutrin 300 daily    FU IBS- C  bm currently stable              REVIEW OF SYSTEMS:   Review of Systems  General: No fever.  No chills.  No weight changes.  HEENT: No vision changes.  Cardiac: No chest pain. No palpitations.  No dizziness.  No light-headedness.  No near syncope.  Resp: No dyspnea at rest, no dyspnea on exertion; no cough or hemoptysis; no orthopnea or PND.  GI: No N/V. No melena.  No  bright red blood per rectum.  Ext: No edema.  No claudication.  Neuro: No focal weakness.  No numbness.  All other ROS negative.      Objective:   BP 122/84 (Site: Left, Patient Position: Sitting)   Pulse 100   Temp 36.3 C (97.4 F) (Tympanic)   Ht 1.626 m (5\' 4" )   Wt 77.5 kg (170 lb 12.8 oz)   SpO2 97%   BMI 29.32 kg/m              PHYSICAL EXAM  Physical Exam  Gen: NAD. Alert.   HEENT: PERRL; conjunctivae clear. No JVD or carotid bruit.  Cardiac: RRR with normal S1, S2.   Lungs: Clear to auscultation bilaterally. No rales. No wheezing. No rhonchi.  Abdomen: Soft, non-tender.non-distended  nl bowel sounds    Extremities: No edema. No cyanosis. No clubbing.  Neurologic:  Grossly intact    Lab Results   Component Value Date    CHOLESTEROL 194 12/15/2022    HDLCHOL 42 12/15/2022    LDLCHOL 90 12/15/2022    TRIG 310 (H) 12/15/2022       COMPLETE BLOOD COUNT   Lab Results   Component Value Date    WBC 4.8 12/15/2022    HGB 15.3 (H) 12/15/2022    HCT 45.3 (H) 12/15/2022    PLTCNT 211 12/15/2022       DIFFERENTIAL  Lab Results   Component Value Date    PMNS 69 12/15/2022    LYMPHOCYTES 21 12/15/2022    MONOCYTES 9 12/15/2022    EOSINOPHIL 1 12/15/2022    BASOPHILS 0 12/15/2022    BASOPHILS 0.00 12/15/2022    PMNABS 3.30 12/15/2022    LYMPHSABS 1.00 12/15/2022    EOSABS 0.00 12/15/2022    MONOSABS 0.40 12/15/2022        COMPREHENSIVE METABOLIC PANEL   Lab Results   Component Value Date    SODIUM 139 12/15/2022    POTASSIUM 4.6 12/15/2022    CHLORIDE 105 12/15/2022    CO2 27 12/15/2022    ANIONGAP 7 12/15/2022    BUN 16 12/15/2022    CREATININE 0.89 12/15/2022    GLUCOSENF 88 12/15/2022    CALCIUM 9.7 12/15/2022    ALB 4.3 06/17/2022    ALBUMIN 4.4 12/15/2022    TOTALPROTEIN 6.8 12/15/2022    ALKPHOS 115 (H) 12/15/2022    AST 22 12/15/2022    ALT 22 12/15/2022            THYROID STIMULATING HORMONE  Lab Results  Component Value Date    TSH 3.454 12/15/2022        Lab Results   Component Value Date    HA1C 4.9  06/17/2022     Lab Results   Component Value Date    VITD 85 12/12/2021         Assessment/Plan  Problem List Items Addressed This Visit          Cardiovascular System    Thromboembolic disorder (CMS [place])    Relevant Orders    CBC/DIFF (Completed)    COMPREHENSIVE METABOLIC PNL, FASTING (Completed)    THYROID STIMULATING HORMONE (SENSITIVE TSH) (Completed)    LIPID PANEL (Completed)    Mixed hyperlipidemia    Relevant Orders    CBC/DIFF (Completed)    COMPREHENSIVE METABOLIC PNL, FASTING (Completed)    THYROID STIMULATING HORMONE (SENSITIVE TSH) (Completed)    LIPID PANEL (Completed)    Hypertension    Relevant Orders    CBC/DIFF (Completed)    COMPREHENSIVE METABOLIC PNL, FASTING (Completed)    THYROID STIMULATING HORMONE (SENSITIVE TSH) (Completed)    LIPID PANEL (Completed)       Endocrine    Acquired hypothyroidism - Primary    Relevant Orders    CBC/DIFF (Completed)    COMPREHENSIVE METABOLIC PNL, FASTING (Completed)    THYROID STIMULATING HORMONE (SENSITIVE TSH) (Completed)    LIPID PANEL (Completed)       Musculoskeletal    Fibromyositis    Relevant Orders    CBC/DIFF (Completed)    COMPREHENSIVE METABOLIC PNL, FASTING (Completed)    THYROID STIMULATING HORMONE (SENSITIVE TSH) (Completed)    LIPID PANEL (Completed)        Orders Placed This Encounter    CANCELED: AREXVY ADULT RSV (ADMIN)    CBC/DIFF    COMPREHENSIVE METABOLIC PNL, FASTING    THYROID STIMULATING HORMONE (SENSITIVE TSH)    LIPID PANEL    CBC WITH DIFF    gabapentin (NEURONTIN) 600 mg Oral Tablet    pneumoc 20-valent conjugate vaccine (PREVNAR 20) IntraMUSCULAR Syringe    RSVPreF3 antigen-AS01E, PF, (AREXVY, PF,) 120 mcg/0.5 mL IntraMUSCULAR Suspension for Reconstitution        Meds reviewed as well as labs.  Chart reviewed and updated.   Continue current treatment.  Keep follow-up appointment.   Vaccine hx reviewed.   Influenza  influenza, injectable, quadrivalent, preservative free 06-18-2018  influenza, unspecified formulation  07-28-2017    Pneumococcal  pneumococcal conjugate PCV 13 07-23-2017  Needs tdap and  rsv     Rsv  sent to pharmacy

## 2022-12-20 MED ORDER — AREXVY (PF) 120 MCG/0.5 ML IM SUSPENSION
0.5000 mL | INHALATION_SUSPENSION | Freq: Once | INTRAMUSCULAR | 0 refills | Status: AC
Start: 2022-12-20 — End: 2022-12-20

## 2023-01-20 ENCOUNTER — Other Ambulatory Visit (INDEPENDENT_AMBULATORY_CARE_PROVIDER_SITE_OTHER): Payer: Self-pay | Admitting: Internal Medicine

## 2023-01-20 MED ORDER — TRELEGY ELLIPTA 200 MCG-62.5 MCG-25 MCG POWDER FOR INHALATION
1.0000 | DISK | Freq: Every day | RESPIRATORY_TRACT | 3 refills | Status: DC
Start: 2023-01-20 — End: 2023-01-20

## 2023-01-20 MED ORDER — TRELEGY ELLIPTA 200 MCG-62.5 MCG-25 MCG POWDER FOR INHALATION
1.0000 | DISK | Freq: Every day | RESPIRATORY_TRACT | 3 refills | Status: DC
Start: 2023-01-20 — End: 2023-04-21

## 2023-01-26 ENCOUNTER — Other Ambulatory Visit (INDEPENDENT_AMBULATORY_CARE_PROVIDER_SITE_OTHER): Payer: Self-pay | Admitting: Internal Medicine

## 2023-01-30 ENCOUNTER — Encounter (INDEPENDENT_AMBULATORY_CARE_PROVIDER_SITE_OTHER): Payer: Self-pay | Admitting: NEUROLOGY

## 2023-01-30 MED ORDER — OXCARBAZEPINE 300 MG TABLET
600.0000 mg | ORAL_TABLET | Freq: Two times a day (BID) | ORAL | 0 refills | Status: DC
Start: 2023-01-30 — End: 2023-03-03

## 2023-02-24 ENCOUNTER — Other Ambulatory Visit (INDEPENDENT_AMBULATORY_CARE_PROVIDER_SITE_OTHER): Payer: Self-pay | Admitting: Internal Medicine

## 2023-02-24 ENCOUNTER — Encounter (INDEPENDENT_AMBULATORY_CARE_PROVIDER_SITE_OTHER): Payer: Self-pay | Admitting: Internal Medicine

## 2023-02-24 DIAGNOSIS — Z1231 Encounter for screening mammogram for malignant neoplasm of breast: Secondary | ICD-10-CM

## 2023-02-25 IMAGING — MG 3D SCREENING MAMMO BIL AND TOMO
5 series · 8 of 24 positions shown · non-contrast
Comparison: 02/06/2024

------------- REPORT [id number] -------------
﻿

EXAM:  3D SCREENING MAMMO BIL AND TOMO
INDICATION: Screening mammogram.

[L]
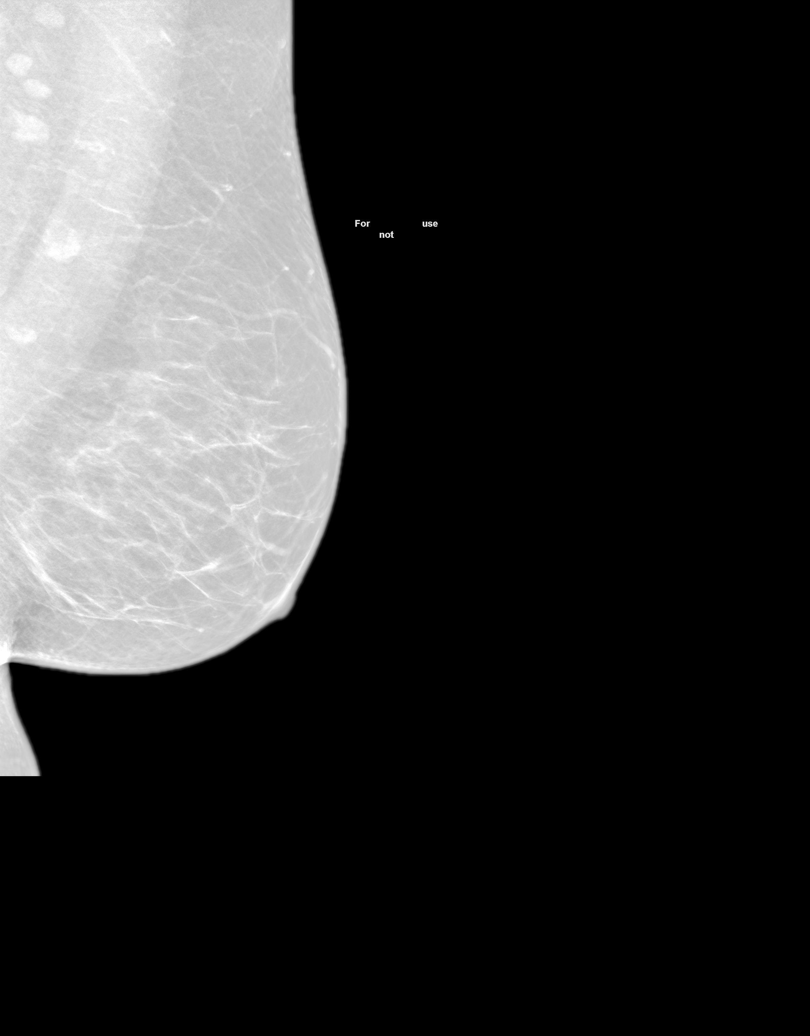

[R]
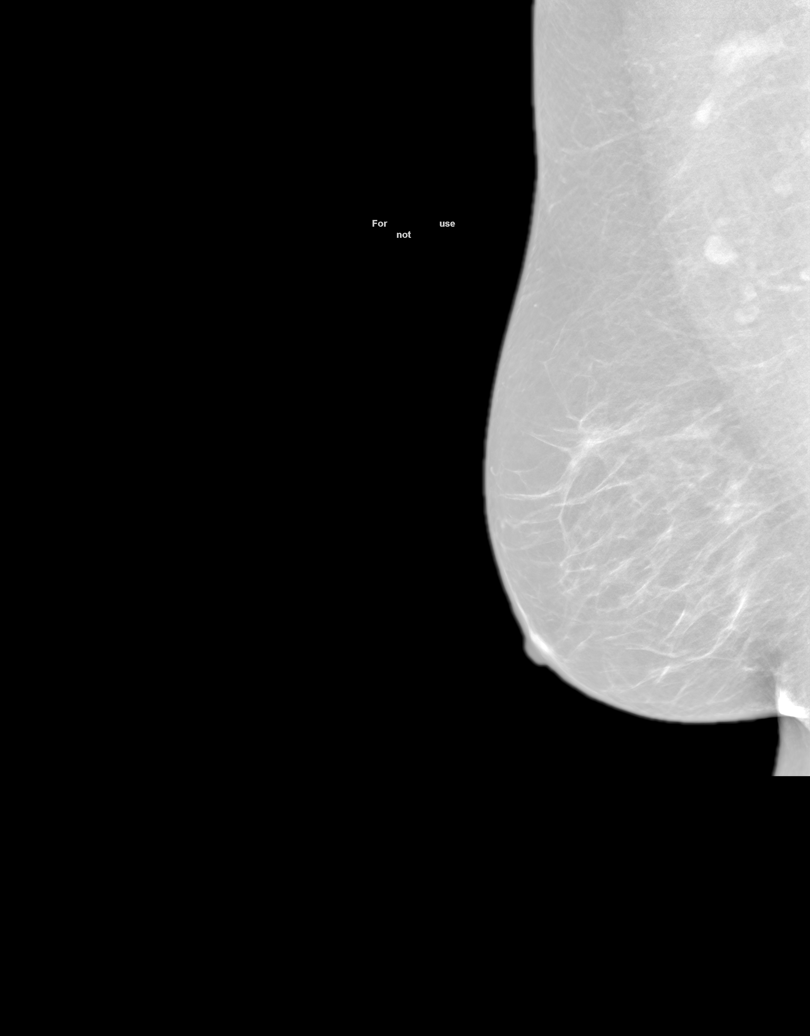

[R CC tomo · right · 0.10mm/px · 2 of 2 slices shown]
[im 1/2]
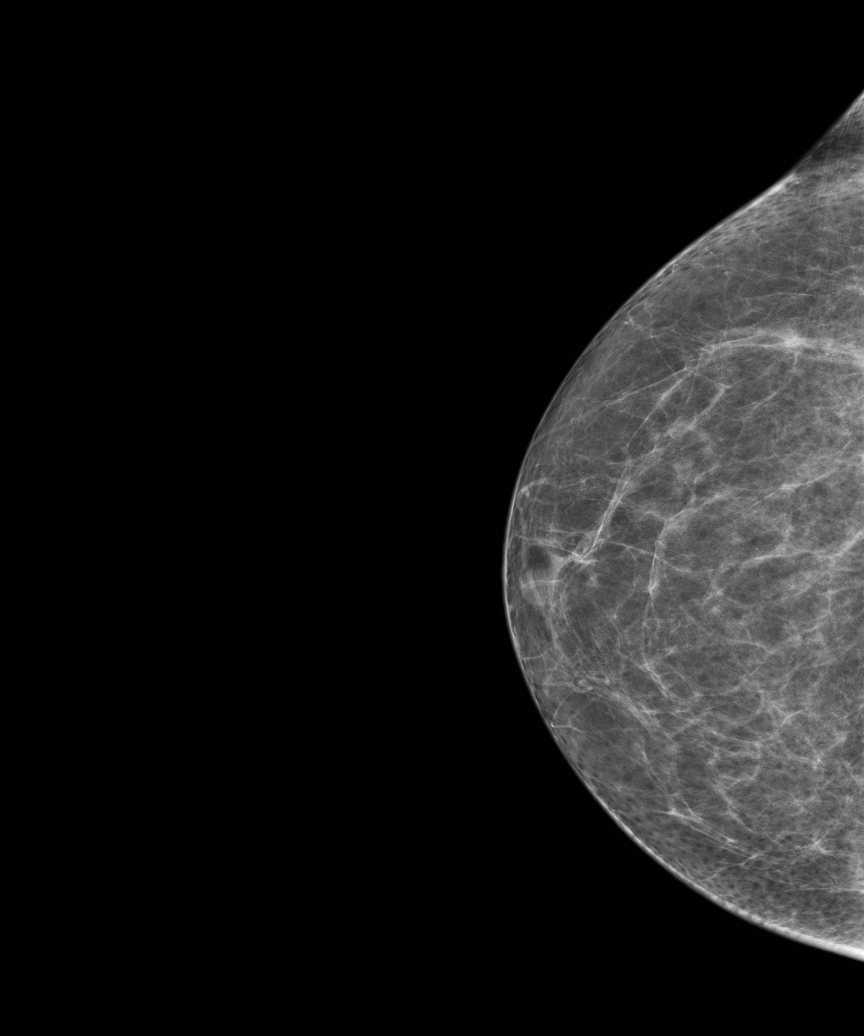
[im 2/2]
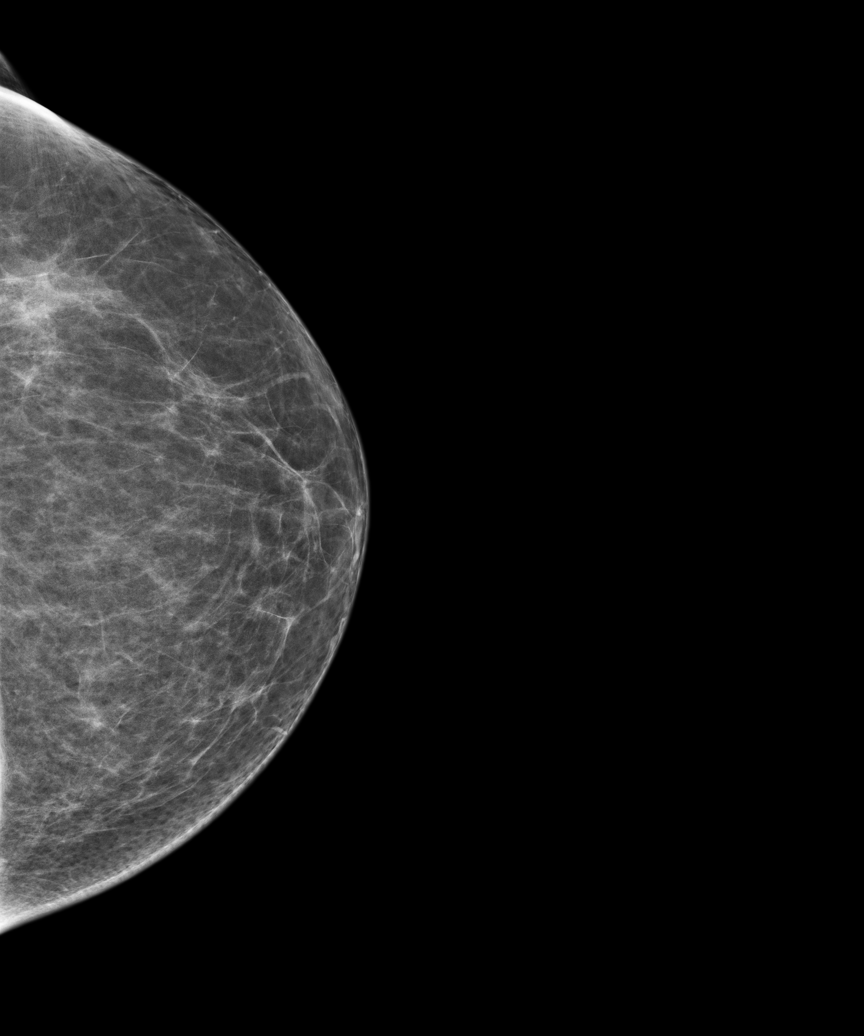

[3D SCREENING MAMMO BIL AND TOMO tomo · 2 acquisitions, 3 frames shown (1 of 2)]
[im 1/2]
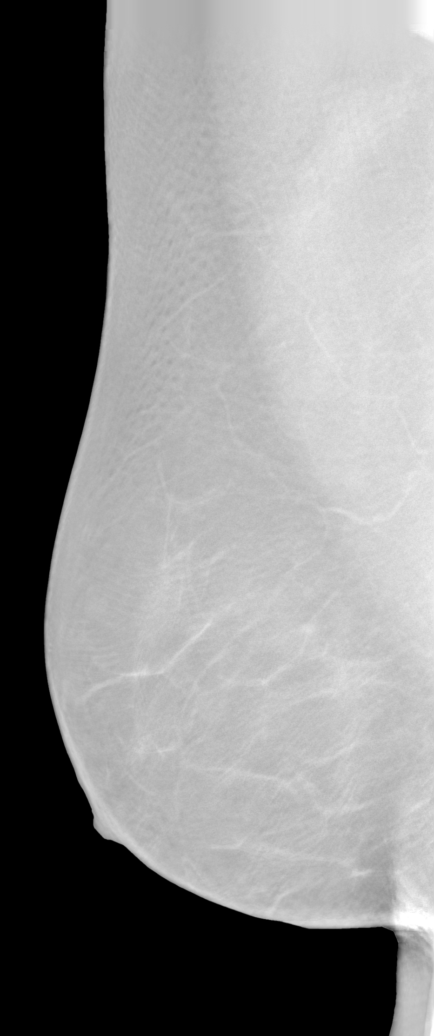
[im 2/2]
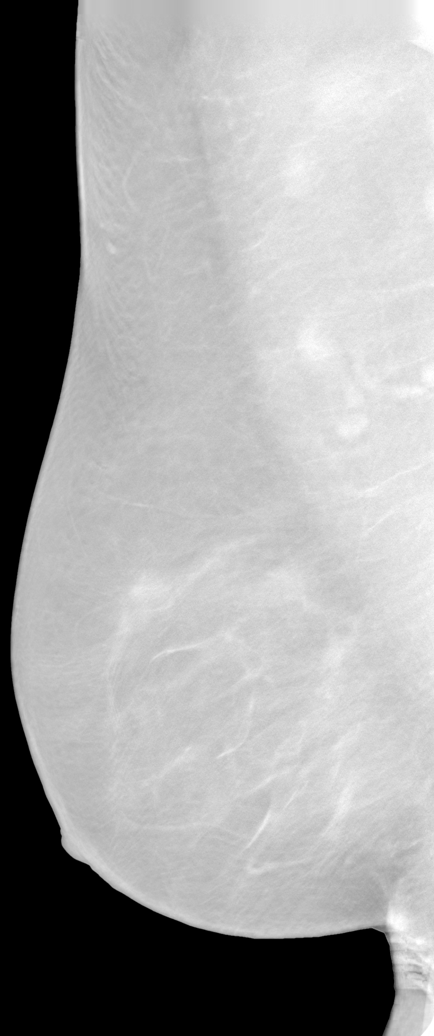
[im 2/2]
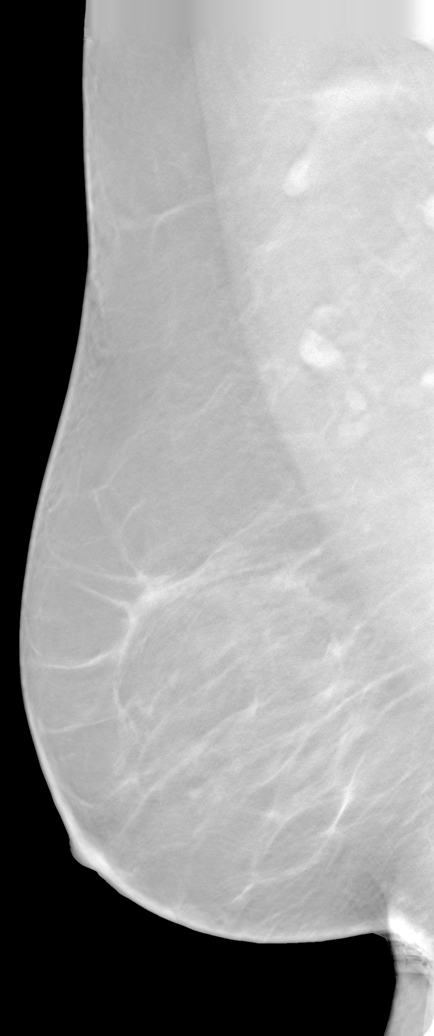

[3D SCREENING MAMMO BIL AND TOMO tomo (2 of 2) · tomo slice 12/73.0]
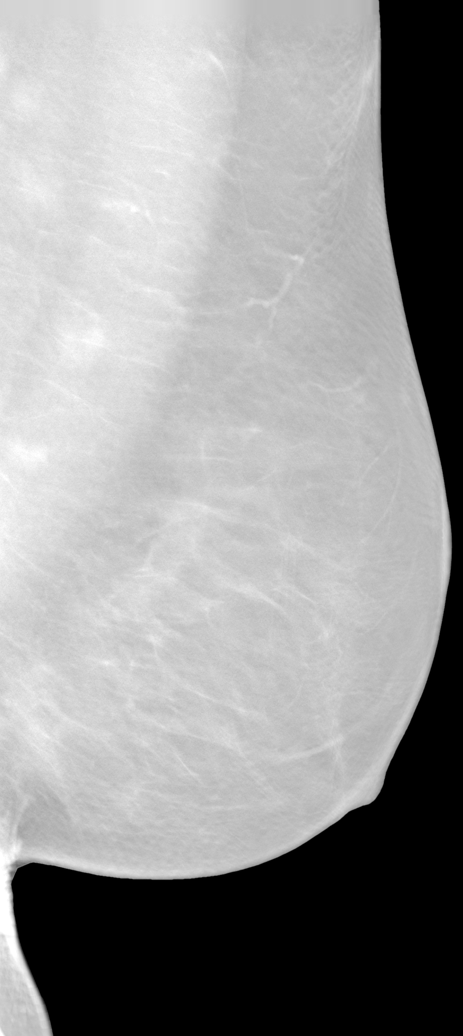

[8 of 24 positions shown; findings below may reference images not displayed]

FINDINGS: There are scattered fibroglandular elements.  There is no mass or suspicious cluster of microcalcifications.   There is no architectural distortion, skin thickening or nipple retraction.
IMPRESSION: 1.  BIRADS 2-Benign findings. Patient has been added in a reminder system with a target date for the next screening mammography.

2.  DENSITY CODE – B (Scattered areas of fibroglandular density)  

Final Assessment Code:

BI-RADS 0
 Need additional imaging evaluation.

BI-RADS 1
 Negative mammogram.

BI-RADS 2
 Benign finding.

BI-RADS 3
 Probably benign finding; short-interval follow-up suggested.

BI-RADS 4
 Suspicious abnormality; biopsy should be considered.

BI-RADS 5
 Highly suggestive of malignancy; appropriate action should be taken.

BI-RADS 6
 Known biopsy-proven malignancy; appropriate action should be taken.

NOTE:
In compliance with Federal regulations, the results of this mammogram are being sent to the patient.

------------- REPORT [id number] -------------
[facility]
[address]
[place]/BISHIR, YAHAYA IBRAHIM
We wish to report the following on your recent mammography examination. We are sending a report to your referring physician or other health care provider.
FINDING: Normal-no evidence of cancer

This statement is mandated by the [place], Department of Health.
Your examination was performed by one of our technologists, who are registered radiological technologists and also specially certified in mammography:
___
Marzan, Heron (M)

Your mammogram was interpreted by our radiologist.

( 
Apple Martha, M.D.

(Annual Breast Examination by a physician or other health care provider
(Annual Mammography Screening beginning at age 40
(Monthly Breast Self Examination

## 2023-02-27 ENCOUNTER — Encounter (INDEPENDENT_AMBULATORY_CARE_PROVIDER_SITE_OTHER): Payer: Self-pay | Admitting: Internal Medicine

## 2023-02-27 ENCOUNTER — Other Ambulatory Visit (INDEPENDENT_AMBULATORY_CARE_PROVIDER_SITE_OTHER): Payer: Self-pay

## 2023-02-27 DIAGNOSIS — Z1231 Encounter for screening mammogram for malignant neoplasm of breast: Secondary | ICD-10-CM

## 2023-03-03 ENCOUNTER — Ambulatory Visit (INDEPENDENT_AMBULATORY_CARE_PROVIDER_SITE_OTHER): Payer: Commercial Managed Care - PPO | Admitting: NEUROLOGY

## 2023-03-03 ENCOUNTER — Other Ambulatory Visit: Payer: Self-pay

## 2023-03-03 ENCOUNTER — Encounter (INDEPENDENT_AMBULATORY_CARE_PROVIDER_SITE_OTHER): Payer: Self-pay | Admitting: NEUROLOGY

## 2023-03-03 VITALS — BP 131/76 | HR 89 | Temp 97.5°F | Wt 165.2 lb

## 2023-03-03 DIAGNOSIS — G500 Trigeminal neuralgia: Secondary | ICD-10-CM

## 2023-03-03 DIAGNOSIS — R4181 Age-related cognitive decline: Secondary | ICD-10-CM

## 2023-03-03 DIAGNOSIS — R202 Paresthesia of skin: Secondary | ICD-10-CM

## 2023-03-03 DIAGNOSIS — G4733 Obstructive sleep apnea (adult) (pediatric): Secondary | ICD-10-CM

## 2023-03-03 MED ORDER — OXCARBAZEPINE 300 MG TABLET
900.0000 mg | ORAL_TABLET | Freq: Two times a day (BID) | ORAL | 5 refills | Status: DC
Start: 2023-03-03 — End: 2023-11-02

## 2023-03-03 MED ORDER — OXCARBAZEPINE 300 MG TABLET
900.0000 mg | ORAL_TABLET | Freq: Two times a day (BID) | ORAL | 1 refills | Status: DC
Start: 2023-03-03 — End: 2023-03-03

## 2023-03-03 NOTE — Progress Notes (Signed)
ASSESSMENT  TRIGEMINAL NEURALGIA:  She is a 67 year old woman who is returns to clinic for 1 year of progressive facial pain and numbness on the right side.  Symptoms started somewhat insidiously with frank numbness before becoming painful and tender to touch.  Her exam is notable for sensory deficits in V2 on the right side. We did obtain an MRI and MRA of the head which were essentially unremarkable. Nerve conduction studies revealed decrease amplitudes of the right trigeminal nerve compared to the left. She has experienced significant response to Trileptal but has required increasing doses. We will increase to 900 mg BID but likely cannot increase further. I would consider addition of other agents or possible switch to lacosamide.   2.   EXTREMITY PARESTHESIAS:  She has a longer standing history of paresthesias in the arms and legs.  These can be painful at times.  Her exam was previously notable for mostly small fiber deficits in a patchy distribution in the lower extremities calling into question possible small fiber neuropathy which does have an association with fibromyalgia.  EMG revealed no evidence of a large fiber process. The addition of trileptal has helped with some of this. Rheumatologic workup has been unremarkable. We will continue to monitor. She also has some pain that develops with repetitive tasks in right hand concerning for carpel tunnel syndrome. There was no electrophysiologic evidence of this, but conductions may be normal in mild cases. She can trial wrist bracing at night.   3. MEMORY CHANGES: She reports decline in cognition since last visit. She does report a history of severe sleep apnea. Unfortunately she has not been able to tolerate CPAP due to the straps touching her face. I do think this is likely the etiology of her memory deficits as she has also noted increased fatigue. We will try to increase her Trileptal to get better control of her symptoms so hopefully she can tolerate PAP  therapy. I do recommend she establish with sleep medicine.     PLAN  1. Increase Trileptal to 900 mg BID      Will obtain repeat labs at next visit  2.  Continue to monitor       If worsening, could consider the utility of skin biopsy to confirm presence of SFN  3.  Increase Trileptal as above       Encouraged establishing with sleep medicine    Return to clinic 3 months    Thank you for allowing me to participate in your patient's care and please do not hesitate to contact me for any questions or concerns.    Patrick North, DO  Assistant Professor of Neurology  [facility] [facility]     [phone]: I will continue to be the provider focal point in managing the chronic complex neurological condition  ==========================================================================================================================================    NAME:  Jo Wong  DOB:  1956/02/25  VISIT DATE:  06/17/2022    CC:  Facial Numbness    Patient seen in consultation at the request of Dr. Lonia Farber  History obtained from the patient and chart/records  Age of patient:  67 y.o.    INTERVAL: Has dull pain in V2 distribution on the right and more intense pain in V1. Increase in Trileptal has been helpful, however, pain is still present most of the time. She also reports cognitive decline. Reports not being able to come up with names of good friends. She has had increased fatigue. She reports a history of  severe sleep apnea. Unfortunately, she has been unable to tolerate PAP therapy because of how the straps lay across her face.  Has also noted some balance issues. No falls.      HPI:   I had the pleasure of seeing your patient in neurology clinic for an outpatient consultation, who is a 67 y.o. year old female who was referred for evaluation of facial numbness.  Please allow me to summarize the history for the record.     She states that symptoms began in November 2022.  It started with numbness that  began under the right eye.  She did have a red spot in the same location at the time.  She does have a history of skin cancer and was given topical chemotherapeutic agent by her dermatologist to treat this.  Despite this, the numbness persisted and then began to extend down the right side of the face to the lip and laterally towards the ear.  Gradually it became painful and tender to touch.  She reports the even light stimulus now can cause pain on the right side of her face.  She denies any weakness.  She does report that her right eye seems to have had a very mild decline in visual acuity.  She denies any changes in taste.  She does have a previous history of headaches though nothing with similar symptoms.  She also reports more longstanding numbness and tingling in the arms and legs.  She does report a history of fibromyalgia.  She does have some lightheadedness with standing.  She does have issues with constipation and diarrhea as she has a history of IBS.    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    Headache, post-traumatic    Thyroid nodule    Thrombosis of mesenteric vein (CMS HCC)    Thromboembolic disorder (CMS HCC)    Fibromyositis    Multiple nodules of lung    IBS (irritable bowel syndrome)    Insomnia    Acquired hypothyroidism    Mixed hyperlipidemia    Hypertension    Major depressive disorder    Prolapsed cervical intervertebral disc    Cyst of ovary    Chronic pain    Generalized anxiety disorder    Actinic keratosis    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     Past Surgical History:   Procedure Laterality Date    BILATERAL OOPHORECTOMY      COLONOSCOPY  2017    Dr mal    HX CHOLECYSTECTOMY      HX LASER SKIN LESION REMOVAL      01/31/22    HX THYROIDECTOMY      HX TUBAL  LIGATION      SKIN GRAFT      12/2020  right upper arm    SKIN LESION EXCISION      removal of pigmented skin lesion    SQUAMOUS CELL CARCINOMA EXCISION      10/2019  left shoulder and right front shoulder 03/2021  back right         Family Medical History:       Problem Relation (Age of Onset)    Aortic Aneurysm Brother    Blood Clots Mother    Breast Disease Sister    Cerebral Aneurysm Maternal Aunt    Deep vein thrombosis Sister    Heart Disease  Mother    Hypertension (High Blood Pressure) Mother    Stroke Mother    Ulcers Brother            Current Outpatient Medications   Medication Sig Dispense Refill    albuterol sulfate (PROVENTIL HFA/VENTOLIN HFA) 90 mcg Inhalation Inhaler Take 2 Puffs by inhalation Every 6 hours as needed      buPROPion (WELLBUTRIN XL) 300 mg extended release 24 hr tablet Take 1 Tablet (300 mg total) by mouth Once a day 90 Tablet 2    Cholecalciferol, Vitamin D3, 50 mcg (2,000 unit) Oral Capsule Take 1 Capsule by mouth Once a day      fluticasone-umeclidin-vilanter (TRELEGY ELLIPTA) 200-62.5-25 mcg Inhalation Disk with Device Take 1 Inhalation by inhalation Once a day 4 Each 3    gabapentin (NEURONTIN) 600 mg Oral Tablet Take 1 Tablet (600 mg total) by mouth Twice daily 180 Tablet 1    levothyroxine (SYNTHROID) 50 mcg Oral Tablet TAKE 1 TABLET EVERY MORNING 90 Tablet 3    losartan (COZAAR) 50 mg Oral Tablet Take 1 Tablet (50 mg total) by mouth Once a day for 90 days 90 Tablet 2    mecobalamin, vitamin B12, 1,000 mcg Oral Tablet, Chewable Chew 1 Tablet (1,000 mcg total) Once a day      milnacipran (SAVELLA) 100 mg Oral Tablet Take 1 Tablet (100 mg total) by mouth Twice daily 90 Tablet 2    montelukast (SINGULAIR) 10 mg Oral Tablet Take 1 Tablet (10 mg total) by mouth Every evening      nitroGLYCERIN (NITROSTAT) 0.4 mg Sublingual Tablet, Sublingual Place 1 Tablet (0.4 mg total) under the tongue Every 5 minutes as needed for Chest pain for 3 doses over 15 minutes      omega-3 fatty acid (LOVAZA)  1 gram Oral Capsule Take 1 Capsule (1 g total) by mouth Once a day      OXcarbazepine (TRILEPTAL) 300 mg Oral Tablet Take 3 Tablets (900 mg total) by mouth Twice daily for 180 days Indications: facial nerve pain 180 Tablet 5    pantoprazole (PROTONIX) 40 mg Oral Tablet, Delayed Release (E.C.) Take 1 Tablet (40 mg total) by mouth Once a day      pravastatin (PRAVACHOL) 20 mg Oral Tablet TAKE 1 TABLET EVERY EVENING 90 Tablet 1     No current facility-administered medications for this visit.     Allergies   Allergen Reactions    Doxycycline  Other Adverse Reaction (Add comment)     Sweats and flushing    vomiting    Duloxetine  Other Adverse Reaction (Add comment)     Unk.     Social History     Socioeconomic History    Marital status: Married     Spouse name: Not on file    Number of children: Not on file    Years of education: Not on file    Highest education level: Not on file   Occupational History    Not on file   Tobacco Use    Smoking status: Never    Smokeless tobacco: Never   Vaping Use    Vaping status: Never Used   Substance and Sexual Activity    Alcohol use: Never    Drug use: Never    Sexual activity: Not on file   Other Topics Concern    Not on file   Social History Narrative    Not on file     Social Determinants of Health  Financial Resource Strain: Low Risk  (03/04/2022)    Financial Resource Strain     SDOH Financial: No   Transportation Needs: Low Risk  (03/04/2022)    Transportation Needs     SDOH Transportation: No   Social Connections: Low Risk  (03/04/2022)    Social Connections     SDOH Social Isolation: 5 or more times a week   Intimate Partner Violence: Low Risk  (03/04/2022)    Intimate Partner Violence     SDOH Domestic Violence: No   Housing Stability: Low Risk  (03/04/2022)    Housing Stability     SDOH Housing Situation: I have housing.     SDOH Housing Worry: No        ============================================================================================================================================  GENERAL EXAMINATION  BP 131/76 (Site: Left, Patient Position: Sitting, Cuff Size: Adult)   Pulse 89   Temp 36.4 C (97.5 F) (Temporal)   Wt 74.9 kg (165 lb 3.2 oz)   SpO2 96%   BMI 28.36 kg/m   Vital signs personally reviewed  General: No acute distress, alert  HEENT: Normocephalic, no scleral icterus  Extremities: No significant edema, No cyanosis    NEUROLOGIC EXAM  On neurological exam, patient was awake, alert and answering questions appropriately  Speech was fluent, without dysarthria or aphasia.    CN  II:  Visual fields intact to confrontation  III, IV, VI: extraocular movements intact without nystagmus  V:  Diminished over  V1, V2 distribution on the right  VII: face symmetric without weakness  VIII: grossly intact  IX, X: symmetric palatal elevation  XI: normal strength of trapezius and sternocleidomastoid bilaterally  XII: tongue midline with full movements    MOTOR  Bulk: normal  Abnormal Movements: none    Strength:     MRC Grading Scale   Right Left   Deltoid 5 5   Biceps 5 5   Triceps 5 5   Wrist Extension - -   Wrist Flexion - -   Finger Extension - -   Finger Abduction - -   Finger Flexion - -   Hip Flexion 5 5   Hip Extension - -   Hip Abduction - -   Hip Adduction - -   Knee Extension 5 5   Knee Flexion 5 5   Ankle Dorsiflexion - -   Ankle Plantarflexion - -   Toe Extension - -   Toe Flexion - -             REFLEXES   Right Left   Biceps 2 2   Triceps 2 2   Brachioradialis 2 2   Patellar 2 2   Achilles 1 1   Plantar - -   Hoffman - -   Pectoralis - -   Jaw Jerk - -       SENSORY  Light touch: intact throughout all extremities    GAIT  General: casual, normal gait    ================================================================================================================================LABS  Personal Review of prior labs is notable for:    2024  CMP WNL   TSH normal  CBC largely WNL   2023  Hepatitis panel negative  Vitamin-D within normal limits   LDL 127   TSH within normal limits   CMP largely within normal limits   CBC largely within normal limits  SSA/B antibodies negative   Rheumatoid factor negative   Anti CCP antibody negative   ANA negative   SPEP within normal limits   B12 greater than 1500  A1c 4.9  IMAGING  Personal Review of imaging is notable for:    MRI Brain w/o contrast 07/17/2022:  Mild degree of atrophy likely in keeping with age otherwise essentially unremarkable study   MRA Head July 17, 2022:  No vascular compression of the trigeminal nerve on the right  OTHER DIAGNOSTICS  Personal Review of other prior diagnostics is notable for:  Not applicable

## 2023-03-09 ENCOUNTER — Encounter (INDEPENDENT_AMBULATORY_CARE_PROVIDER_SITE_OTHER): Payer: Self-pay | Admitting: NEUROLOGY

## 2023-03-23 ENCOUNTER — Encounter (INDEPENDENT_AMBULATORY_CARE_PROVIDER_SITE_OTHER): Payer: Self-pay | Admitting: Internal Medicine

## 2023-03-25 MED ORDER — MILNACIPRAN 100 MG TABLET
100.0000 mg | ORAL_TABLET | Freq: Two times a day (BID) | ORAL | 2 refills | Status: DC
Start: 2023-03-25 — End: 2023-07-28

## 2023-03-25 NOTE — Telephone Encounter (Signed)
From: [name]  To: [name]  Sent: 03/23/2023 7:32 PM EDT  Subject: Refill    Could you please send a prescription for Savella in Thank you!  Darel Hong

## 2023-04-04 ENCOUNTER — Other Ambulatory Visit (INDEPENDENT_AMBULATORY_CARE_PROVIDER_SITE_OTHER): Payer: Self-pay | Admitting: Internal Medicine

## 2023-04-13 ENCOUNTER — Encounter (INDEPENDENT_AMBULATORY_CARE_PROVIDER_SITE_OTHER): Payer: Self-pay | Admitting: Internal Medicine

## 2023-04-20 ENCOUNTER — Inpatient Hospital Stay
Admission: RE | Admit: 2023-04-20 | Discharge: 2023-04-20 | Disposition: A | Discharge status: Home / Self Care | Payer: Commercial Managed Care - PPO | Source: Ambulatory Visit | Attending: Internal Medicine | Admitting: Internal Medicine

## 2023-04-20 ENCOUNTER — Ambulatory Visit: Payer: Commercial Managed Care - PPO | Admitting: Internal Medicine

## 2023-04-20 ENCOUNTER — Encounter (INDEPENDENT_AMBULATORY_CARE_PROVIDER_SITE_OTHER): Payer: Self-pay | Admitting: Internal Medicine

## 2023-04-20 ENCOUNTER — Encounter (INDEPENDENT_AMBULATORY_CARE_PROVIDER_SITE_OTHER): Payer: Self-pay

## 2023-04-20 ENCOUNTER — Other Ambulatory Visit: Payer: Self-pay

## 2023-04-20 VITALS — BP 122/78 | HR 98 | Ht 64.0 in | Wt 162.0 lb

## 2023-04-20 DIAGNOSIS — Z0000 Encounter for general adult medical examination without abnormal findings: Secondary | ICD-10-CM | POA: Insufficient documentation

## 2023-04-20 DIAGNOSIS — J9801 Acute bronchospasm: Secondary | ICD-10-CM | POA: Insufficient documentation

## 2023-04-20 NOTE — Patient Instructions (Signed)
Medicare Preventive Services  Medicare coverage information Recommendation for YOU   Heart Disease and Diabetes   Lipid profile Every 5 years or more often if at risk for cardiovascular disease     Lab Results   Component Value Date    CHOLESTEROL 194 12/15/2022    HDLCHOL 42 12/15/2022    LDLCHOL 90 12/15/2022    TRIG 310 (H) 12/15/2022           Diabetes Screening    Yearly for those at risk for diabetes, 2 tests per year for those with prediabetes Last Glucose: 88    Diabetes Self Management Training or Medical Nutrition Therapy  For those with diabetes, up to 10 hrs initial training within a year, subsequent years up to 2 hrs of follow up training Optional for those with diabetes     Medical Nutrition Therapy  Three hours of one-on-one counseling in first year, two hours in subsequent years Optional for those with diabetes, kidney disease   Intensive Behavioral Therapy for Obesity  Face-to-face counseling, first month every week, month 2-6 every other week, month 7-12 every month if continued progress is documented Optional for those with Body Mass Index 30 or higher  Your Body mass index is 27.81 kg/m.   Tobacco Cessation (Quitting) Counseling   Covers up to 8 smoking and tobacco-use cessation counseling sessions in a 19-month period.    Optional for those that use tobacco   Cancer Screening Last Completion Date   Colorectal screening   For anyone age 5 to 34 or any age if high risk:  Screening Colonoscopy every 10 yrs if low risk,  more frequent if higher risk  OR  Cologuard Stool DNA test once every 3 years OR  Fecal Occult Blood Testing yearly OR  Flexible  Sigmoidoscopy  every 5 yr OR  CT Colonography every 5 yrs      See below for due date if applicable.   Screening Pap Test   Recommended every 3 years for all women age 81 to 54, or every five years if combined with HPV test (routine screening not needed after total hysterectomy).  Medicare covers every 2 years or yearly if high risk.  Screening Pelvic  Exam   Medicare covers every 2 years, yearly if high risk or childbearing age with abnormal Pap in last 3 yrs.     See below for due date if applicable.   Screening Mammogram   Recommended every 2 years for women age 52 to 37, or more frequent if you have a higher risk. Selectively recommended for women between 40-49 based on shared decisions about risk. Covered by Medicare up to every year for women age 69 or older --02/25/2023  See below for due date if applicable.         Lung Cancer Screening  Annual low dose computed tomography (LDCT scan) is recommended for those age 73-80 who smoked 20 pack-years and are current smokers or quit smoking within past 15 years, after counseling by your doctor or nurse clinician about the possible benefits or harms.     See below for due date if applicable.   Vaccinations   Respiratory syncytial virus (RSV)  Age 11 years or older: Based on shared clinical decision-making with your provider.  Pneumococcal Vaccine  Recommended routinely age 40+ with one or two separate vaccines based on your risk. Recommended before age 23 if medical conditions with increased risk  Seasonal Influenza Vaccine  Once every flu season  Hepatitis B Vaccine  3 doses if risk (including anyone with diabetes or liver disease)  Shingles Vaccine  Two doses at age 41 or older  Diphtheria Tetanus Pertussis Vaccine  ONCE as adult, booster every 10 years     Immunization History   Administered Date(s) Administered   . Influenza Vaccine, 6 month-adult 06/24/2019, 08/02/2020, 08/01/2021, 08/11/2022     Shingles vaccine and Diphtheria Tetanus Pertussis vaccines are available at pharmacies or local health department without a prescription.   Other Preventative Screening  Last Completion Date   Bone Densitometry   Screening: All females ages 75 and older every 10 years if initial screening normal. Postmenopausal women ages 10-64 need screening with one or more risk factor: previous fracture, parental hip fracture,  current smoker, low body weight, excessive alcohol use, Rheumatoid Arthritis   For women with diagnosed Osteoporosis, follow up is recommended every 2 years or a frequency recommended by your provider.     --02/21/2022  See below for due date if applicable.     Glaucoma Screening   Yearly if in high risk group such as diabetes, family history, African American age 60+ or Hispanic American age 62+   See your eye care provider for screening.   Hepatitis C Screening   Recommended  for those born between ages 18-79 years.   --12/12/2021  See below for due date if applicable.     HIV Testing  Recommended routinely at least ONCE, covered every year for age 18 to 59 regardless of risk, and every year for age over 47 who ask for the test or higher risk. Yearly or up to 3 times in pregnancy         See below for due date if applicable.   Abdominal Aortic Aneurysm Screening Ultrasound   Once with a family history of abdominal aortic aneurysms OR a female between65-75 and have smoked at least 100 cigarettes in your lifetime.         See below for due date if applicable.       Your Personalized Schedule for Preventive Tests   Health Maintenance: Pending and Last Completed       Date Due Completion Date    Pneumococcal Vaccination, Age 70+ (1 of 2 - PCV) Never done ---    Adult Tdap-Td (1 - Tdap) Never done ---    Shingles Vaccine (1 of 2) Never done ---    RSV Pregnant or 60+ (1 - 1-dose 60+ series) Never done ---    Medicare Annual Wellness Visit - Calendar Year Insurers 09/22/2022 04/07/2022    Influenza Vaccine (1) 05/24/2023 08/11/2022    Breast Cancer Screening 02/24/2025 02/25/2023    Colonoscopy 04/08/2026 ---    Osteoporosis screening 02/22/2032 02/21/2022                For Information on Advanced Directives for Health Care:  [place]:  [email or web address]  [place], [place], [place], [place] General Information: [email or web address]

## 2023-04-20 NOTE — Progress Notes (Signed)
INTERNAL MEDICINE, BUILDING A  [address]  [place] [address]  Operated by [facility]  Medicare Annual Wellness Visit    Name: Jo Wong MRN:  [id number]   Date: 04/20/2023 Age: 67 y.o.       SUBJECTIVE:   Jo Wong is a 67 y.o. female for presenting for Medicare Wellness exam.   I have reviewed and reconciled the medication list with the patient today.        04/20/2023    10:28 AM 04/07/2022    10:00 AM   Comprehensive Health Assessment-Adult   Do you wish to complete this form? Yes Yes   During the past 4 weeks, how would you rate your health in general? Good Good   During the past 4 weeks, how much difficulty have you had doing your usual activities inside and outside your home because of medical or emotional problems? A little bit of difficulty A little bit of difficulty   During the past 4 weeks, was someone available to help you if you needed and wanted help? Yes, as much as I wanted Yes, as much as I wanted   In the past year, how many times have you gone to the emergency department or been admitted to a hospital for a health problem? None None   Are you generally satisfied with your sleep? No No   Do you have enough money to buy things you need in everyday life, such as food, clothing, medicines, and housing? Sometimes Yes, always   Can you get to places beyond walking distance without help?  (For example, can you drive your own car or travel alone on buses)? Yes No   Do you fasten your seatbelt when you are in a car? Yes, usually Yes, usually   Do you exercise 20 minutes 3 or more days per week (such as walking, dancing, biking, mowing grass, swimming)? Yes, some of the time Yes, most of the time   How often do you eat food that is healthy (fruits, vegetables, lean meats) instead of unhealthy (sweets, fast food, junk food, fatty foods)? Some of the time Most of the time   How often do you have trouble taking medicines the eay you are told to take them? I always take  them as prescribed I always take them as prescribed   Do you need any help communicating with your doctors and nurses because of vision or hearing problems? No No   During the past 12 months, have you experienced confusion or memory loss that is happening more often or is getting worse? No No   Do you have one person you think of as your personal doctor (primary care provider or family doctor)? Yes Yes   If you are seeing a Primary Care Provider (PCP) or family doctor. please list their name [facility]   Are you now also seeing any specialist physician(s) (such as eye doctor, foot doctor, skin doctor)? Yes Yes   If you are seeing a specialist for anything such as foot, eye, skin, etc.  please list their name(s) dr cox  dr Sherilyn Banker  dr Laural Benes    [facility] dr Sherilyn Banker  dr fig  [place]   [facility]   How confident are you that you can control or manage most of your health problems? Somewhat confident Somewhat confident       I have reviewed and updated as appropriate the past medical, family and social  history. 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     Stomach cramps     Thromboembolic disorder (CMS HCC)     Thrombosis of mesenteric vein (CMS HCC)     Thyroid nodule      Past Surgical History:   Procedure Laterality Date    Bilateral oophorectomy      Colonoscopy  2017    Hx cholecystectomy      Hx  laser skin lesion removal      Hx thyroidectomy      Hx tubal ligation      Skin graft      Skin lesion excision      Squamous cell carcinoma excision       Current Outpatient Medications   Medication Sig    albuterol sulfate (PROVENTIL HFA/VENTOLIN HFA) 90 mcg Inhalation Inhaler Take 2 Puffs by inhalation Every 6 hours as needed    budesonide-glycopyr-formoterol (BREZTRI AEROSPHERE) 160-9-4.8 mcg/actuation Inhalation HFA Aerosol Inhaler Take 2 Puffs by inhalation Twice daily    buPROPion (WELLBUTRIN XL) 300 mg extended release 24 hr tablet Take 1 Tablet (300 mg total) by mouth Once a day    Cholecalciferol, Vitamin D3, 50 mcg (2,000 unit) Oral Capsule Take 1 Capsule by mouth Once a day    fluticasone-umeclidin-vilanter (TRELEGY ELLIPTA) 200-62.5-25 mcg Inhalation Disk with Device Take 1 Inhalation by inhalation Once a day    gabapentin (NEURONTIN) 600 mg Oral Tablet Take 1 Tablet (600 mg total) by mouth Twice daily    levothyroxine (SYNTHROID) 50 mcg Oral Tablet TAKE 1 TABLET EVERY DAY    losartan (COZAAR) 50 mg Oral Tablet Take 1 Tablet (50 mg total) by mouth Once a day for 90 days    mecobalamin, vitamin B12, 1,000 mcg Oral Tablet, Chewable Chew 1 Tablet (1,000 mcg total) Once a day    milnacipran (SAVELLA) 100 mg Oral Tablet Take 1 Tablet (100 mg total) by mouth Twice daily    montelukast (SINGULAIR) 10 mg Oral Tablet Take 1 Tablet (10 mg total) by mouth Every evening    nitroGLYCERIN (NITROSTAT) 0.4 mg Sublingual Tablet, Sublingual Place 1 Tablet (0.4 mg total) under the tongue Every 5 minutes as needed for Chest pain for 3 doses over 15 minutes    omega-3 fatty acid (LOVAZA) 1 gram Oral Capsule Take 1 Capsule (1 g total) by mouth Once a day    OXcarbazepine (TRILEPTAL) 300 mg Oral Tablet Take 3 Tablets (900 mg total) by mouth Twice daily for 180 days Indications: facial nerve pain    pantoprazole (PROTONIX) 40 mg Oral Tablet, Delayed Release (E.C.) Take 1 Tablet (40 mg total) by mouth Once a day    pravastatin  (PRAVACHOL) 20 mg Oral Tablet  TAKE 1 TABLET EVERY DAY     Family Medical History:       Problem Relation (Age of Onset)    Aortic Aneurysm Brother    Blood Clots Mother    Breast Disease Sister    Cerebral Aneurysm Maternal Aunt    Deep vein thrombosis Sister    Heart Disease Mother    Hypertension (High Blood Pressure) Mother    Stroke Mother    Ulcers Brother            Social History     Socioeconomic History    Marital status: Married   Tobacco Use    Smoking status: Never    Smokeless tobacco: Never   Vaping Use    Vaping status: Never Used   Substance and Sexual Activity    Alcohol use: Never    Drug use: Never     Social Determinants of Health     Financial Resource Strain: Low Risk  (03/04/2022)    Financial Resource Strain     SDOH Financial: No   Transportation Needs: Low Risk  (03/04/2022)    Transportation Needs     SDOH Transportation: No   Social Connections: Low Risk  (03/04/2022)    Social Connections     SDOH Social Isolation: 5 or more times a week   Intimate Partner Violence: Low Risk  (03/04/2022)    Intimate Partner Violence     SDOH Domestic Violence: No   Housing Stability: Low Risk  (03/04/2022)    Housing Stability     SDOH Housing Situation: I have housing.     SDOH Housing Worry: No   Health Literacy: Low Risk  (04/20/2023)    Health Literacy     SDOH Health Literacy: Never   Employment Status: Low Risk  (03/04/2022)    Employment Status     SDOH Employment: Otherwise unemployed but not seeking work (ex. Consulting civil engineer, retired, disabled, unpaid primary care giver)         List of Current Health Care Providers   Care Team       PCP       Name Type Specialty Phone Number    [place], Ranae Plumber, [place] Physician Assistant PHYSICIAN ASSISTANT [phone]              Care Team       No care team found                      Health Maintenance   Topic Date Due    Pneumococcal Vaccination, Age 22+ (1 of 2 - PCV) Never done    Adult Tdap-Td (1 - Tdap) Never done    Shingles Vaccine (1 of 2) Never done    RSV  Pregnant or 60+ (1 - 1-dose 60+ series) Never done    [employer] Annual Wellness Visit - Calendar Year Insurers  09/22/2022    Influenza Vaccine (1) 05/24/2023    Breast Cancer Screening  02/24/2025    Colonoscopy  04/08/2026    Osteoporosis screening  02/22/2032    Hepatitis C screening  Completed    Meningococcal Vaccine  Aged Out    Covid-19 Vaccine  Discontinued     Medicare Wellness Assessment   Medicare initial or wellness physical in the last year?: Yes  Advance Directives   Does patient have a living will or MPOA: No           Advance directive information given to the patient today?:  Patient Declined      Activities of Daily Living   Do you need help with dressing, bathing, or walking?: No   Do you need help with shopping, housekeeping, medications, or finances?: No   Do you have rugs in hallways, broken steps, or poor lighting?: Yes   Do you have grab bars in your bathroom, non-slip strips in your tub, and hand rails on your stairs?: Yes   Cognitive Function Screen (1=Yes, 0=No)   What is you age?: Correct   What is the time to the nearest hour?: Correct   What is the year?: Correct   What is the name of this clinic?: Correct   Can the patient recognize two persons (the doctor, the nurse, home help, etc.)?: Correct   What is the date of your birth? (day and month sufficient) : Correct   In what year did World War II end?: Incorrect   Who is the current president of the [place]?: Correct   Count from 20 down to 1?: Correct   What address did I give you earlier?: Incorrect   Total Score: 8   Interpretation of Total Score: Greater than 6 Normal   Fall Risk Screen   Do you feel unsteady when standing or walking?: No  Do you worry about falling?: No  Have you fallen in the past year?: No   Depression Screen     Little interest or pleasure in doing things.: Not at all  Feeling down, depressed, or hopeless: Not at all  PHQ 2 Total: 0     Pain Score   Pain Score:   0 - No pain    Substance Use-Abuse Screening      Tobacco Use     In Past 12 MONTHS, how often have you used any tobacco product (for example, cigarettes, e-cigarettes, cigars, pipes, or smokeless tobacco)?: Never     Alcohol use     In the PAST 12 MONTHS, how often have you had 5 (men)/4 (women) or more drinks containing alcohol in one day?: Never     Prescription Drug Use     In the PAST 12 months, how often have you used any prescription medications just for the feeling, more than prescribed, or that were not prescribed for you? Prescriptions may include: opioids, benzodiazepines, medications for ADHD: Never           Illicit Drug Use   In the PAST 12 MONTHS, how often have you used any drugs, including marijuana, cocaine or crack, heroin, methamphetamine, hallucinogens, ecstasy/MDMA?: Never            Urine Incontinence Screen   Urinary Incontinence Screen  Do you ever leak urine when you don't want to?: No                      OBJECTIVE:   BP 122/78 (Site: Left Arm, Patient Position: Sitting)   Pulse 98   Ht 1.626 m (5\' 4" )   Wt 73.5 kg (162 lb)   SpO2 96%   BMI 27.81 kg/m        Other appropriate exam:    Health Maintenance Due   Topic Date Due    Pneumococcal Vaccination, Age 15+ (1 of 2 - PCV) Never done    Adult Tdap-Td (1 - Tdap) Never done    Shingles Vaccine (1 of 2) Never done    RSV Pregnant or 60+ (1 - 1-dose 60+ series) Never done    Medicare  Annual Wellness Visit - Calendar Year Insurers  09/22/2022      ASSESSMENT & PLAN:   Assessment/Plan   1. Medicare annual wellness visit, subsequent       Identified Risk Factors/ Recommended Actions       The PHQ 2 Total: 0 depression screen is interpreted as negative.        Patient declined Advanced Directives information.               The patient has been educated about risk factors and recommended preventive care. Written Prevention Plan completed/ updated and given to patient (see After Visit Summary).    Meds reviewed as well as labs.  Chart reviewed and updated.   Continue current  treatment.  Keep follow-up appointment.   Vaccine hx reviewed.    Time spent  15 min on phone    I personally offered the service to the patient, and obtained verbal consent to provide this service.    [name], PA-C     [name], PA-C

## 2023-04-25 ENCOUNTER — Encounter (INDEPENDENT_AMBULATORY_CARE_PROVIDER_SITE_OTHER): Payer: Self-pay | Admitting: Internal Medicine

## 2023-04-27 ENCOUNTER — Encounter (INDEPENDENT_AMBULATORY_CARE_PROVIDER_SITE_OTHER): Payer: Self-pay | Admitting: Internal Medicine

## 2023-04-27 MED ORDER — ALBUTEROL SULFATE HFA 90 MCG/ACTUATION AEROSOL INHALER - RN
2.0000 | Freq: Four times a day (QID) | RESPIRATORY_TRACT | 3 refills | Status: AC | PRN
Start: 2023-04-27 — End: ?

## 2023-04-29 ENCOUNTER — Other Ambulatory Visit (INDEPENDENT_AMBULATORY_CARE_PROVIDER_SITE_OTHER): Payer: Self-pay | Admitting: Internal Medicine

## 2023-05-19 ENCOUNTER — Other Ambulatory Visit: Payer: Self-pay

## 2023-05-19 ENCOUNTER — Ambulatory Visit: Payer: Commercial Managed Care - PPO | Attending: Internal Medicine | Admitting: Internal Medicine

## 2023-05-19 ENCOUNTER — Encounter (INDEPENDENT_AMBULATORY_CARE_PROVIDER_SITE_OTHER): Payer: Self-pay | Admitting: Internal Medicine

## 2023-05-19 VITALS — HR 91

## 2023-05-19 DIAGNOSIS — R058 Other specified cough: Secondary | ICD-10-CM | POA: Insufficient documentation

## 2023-05-19 DIAGNOSIS — I10 Essential (primary) hypertension: Secondary | ICD-10-CM | POA: Insufficient documentation

## 2023-05-19 DIAGNOSIS — F411 Generalized anxiety disorder: Secondary | ICD-10-CM

## 2023-05-19 DIAGNOSIS — F321 Major depressive disorder, single episode, moderate: Secondary | ICD-10-CM | POA: Insufficient documentation

## 2023-05-19 DIAGNOSIS — E039 Hypothyroidism, unspecified: Secondary | ICD-10-CM | POA: Insufficient documentation

## 2023-05-19 DIAGNOSIS — M797 Fibromyalgia: Secondary | ICD-10-CM | POA: Insufficient documentation

## 2023-05-19 DIAGNOSIS — E782 Mixed hyperlipidemia: Secondary | ICD-10-CM

## 2023-05-19 MED ORDER — ARIPIPRAZOLE 2 MG TABLET
2.0000 mg | ORAL_TABLET | Freq: Every day | ORAL | 1 refills | Status: DC
Start: 2023-05-19 — End: 2023-06-10

## 2023-05-19 NOTE — Progress Notes (Signed)
Name: Jo Wong                       Date of Birth: [birth year]   MRN:  [id number]                         Date of visit: 05/19/2023     PCP: [name], PA-C     Subjective  Jo Wong is a 67 y.o. year old female who presents for Cough (Recheck on cough  doing better)   to clinic.  No specialty comments available.   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 11/08/2021    Headache, post-traumatic 11/08/2021    Thyroid nodule 11/08/2021    Thrombosis of mesenteric vein (CMS HCC) 11/08/2021    Thromboembolic disorder (CMS HCC) 11/08/2021    Fibromyositis 11/08/2021    Multiple nodules of lung 11/08/2021    IBS (irritable bowel syndrome) 11/08/2021    Insomnia 11/08/2021    Acquired hypothyroidism 11/08/2021    Mixed hyperlipidemia 11/08/2021    Hypertension 11/08/2021    Major depressive disorder 11/08/2021    Prolapsed cervical intervertebral disc 11/08/2021    Cyst of ovary 11/08/2021    Chronic pain 11/08/2021    Generalized anxiety disorder 11/08/2021    Actinic keratosis 11/08/2021      Current Outpatient Medications   Medication Sig    albuterol sulfate (PROVENTIL HFA/VENTOLIN HFA) 90 mcg Inhalation Inhaler Take 2 Puffs by inhalation Every 6 hours as needed    ARIPiprazole (ABILIFY) 2 mg Oral Tablet Take 1 Tablet (2 mg total) by mouth Once a day    budesonide-glycopyr-formoterol (BREZTRI AEROSPHERE) 160-9-4.8 mcg/actuation Inhalation HFA Aerosol Inhaler Take 2 Puffs by inhalation Twice daily    buPROPion (WELLBUTRIN XL) 300 mg extended release 24 hr tablet Take 1 Tablet (300 mg total) by mouth Once a day    Cholecalciferol, Vitamin D3, 50 mcg (2,000 unit) Oral Capsule Take 1 Capsule by mouth Once a day    fluticasone-umeclidin-vilanter (TRELEGY ELLIPTA)  100-62.5-25 mcg Inhalation Disk with Device Take 1 Inhalation by inhalation Once a day    gabapentin (NEURONTIN) 600 mg Oral Tablet Take 1 Tablet (600 mg total) by mouth Twice daily    levothyroxine (SYNTHROID) 50 mcg Oral Tablet TAKE 1 TABLET EVERY DAY    losartan (COZAAR) 50 mg Oral Tablet Take 1 Tablet (50 mg total) by mouth Once a day for 90 days    mecobalamin, vitamin B12, 1,000 mcg Oral Tablet, Chewable Chew 1 Tablet (1,000 mcg total) Once a day    milnacipran (SAVELLA) 100 mg Oral Tablet Take 1 Tablet (100 mg total) by mouth Twice daily    montelukast (SINGULAIR) 10 mg Oral Tablet Take 1 Tablet (10 mg total) by mouth Every evening    nitroGLYCERIN (NITROSTAT) 0.4 mg Sublingual Tablet, Sublingual Place 1 Tablet (0.4 mg total) under the tongue Every 5 minutes as needed for Chest pain for 3 doses over 15 minutes    omega-3 fatty acid (LOVAZA) 1 gram Oral Capsule Take 1 Capsule (1 g total) by mouth Once a day    OXcarbazepine (TRILEPTAL) 300 mg Oral Tablet Take 3 Tablets (900 mg total) by mouth Twice daily for 180 days Indications: facial nerve pain    pantoprazole (PROTONIX) 40  mg Oral Tablet, Delayed Release (E.C.) TAKE 1 TABLET EVERY DAY    pravastatin (PRAVACHOL) 20 mg Oral Tablet TAKE 1 TABLET EVERY DAY        Chronic Disease Management-AMB  Follow-up visit    Seen by  Dr Sedalia Muta  now on trileptal for trigeminal neuralgia  and  work up reviewed       Pt says  she tries to say words and they dont come out  and she will discuss this  with him     One time  was [place] and could not remember where she was going     Seen by ENT no new orders       FU Fibromyalgia   Pt is down to  one  daily  of savelle due to cost  on for months and stable  but  will be going off due to cost        FU   HX SQ CELL CA  Right shoulder   sq  cell cancer removed      08/2020  and  to fu wtih  [place]   Jan  was her appt and   Jan  16th  area   got  big and   grew   fast          fast growing   6 weeks  Jan   20th   sore and   painful  and   Started on ab  x  20 days  than  bx    + sq  cell  Mohs   surgery  scheduled and  concerned  with lymph  nodes  Dr  Alice Reichert      Feb   28th  surgery lg area removed after nl [place]  to make sure lymph nodes were not involved     and  pt has  wound vac      March  2nd  Pt since has had  skin graft taken from  upper  right thigh and  graft has taken to right upper  arm     Newest area on face sq cell right cheek   Oct   effudex   bid   x  28  days    and  staph infection  by looking  by derm and mupericon        getting better now   pt has large area  on face   crusted over   ( Started Oct  19th  and fu on  Nov  11th)    S/P BASAL CELL OFF ABD AND  NO OTHER  TREATMENT  2022     Hx  of Pneumona Dec  2021      fu eosinophila  asthma sees dr Sherilyn Banker  on symbicort but still not great  will add albuterol  until she fu    meds discussed     ie xolair  or nucala  to discuss with Dr Sherilyn Banker  when she has fu      On Trelegy     On fasenra    FU OSA  on cpap    with Dr Truddie Crumble   Nov  2022   pt still not sleeping well  to fu    Hx [name]    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     2020,2021, 2022   removed       Follow-up hypertension  blood pressures been stable    Follow-up B12 deficiency patient is on over-the-counter B12    Follow-up hypothyroidism patient is on levothyroxine 50 mcg 1/day    Follow-up neuropathy and fibromyalgia patient is on gabapentin 600 mg twice daily along with Savella    gabapentin  level has been don  7/21    Follow-up GERD patient is on Protonix 40 mg daily without breakthrough symptoms    Follow-up hyperlipidemia patient is on pravastatin 20 mg daily along with omega-3    Follow up  Low  vitamin D   patient is on supplement    Follow up Depression patient takes Wellbutrin 300 daily  still feeling down  dsicussed options     FU IBS- C  bm currently stable              REVIEW OF SYSTEMS:   Review of Systems  General: No fever.  No chills.  No weight changes.  HEENT: No  vision changes.  Cardiac: No chest pain. No palpitations.  No dizziness.  No light-headedness.  No near syncope.  Resp: No dyspnea at rest, no dyspnea on exertion; cough or hemoptysis; no orthopnea or PND.  GI: No N/V. No melena.  No bright red blood per rectum.  Ext: No edema.  No claudication.  Neuro: No focal weakness.  No numbness.  All other ROS negative.      Objective:   Pulse 91   SpO2 96%              PHYSICAL EXAM  Physical Exam  Gen: NAD. Alert.   HEENT: PERRL; conjunctivae clear. No JVD or carotid bruit.  Cardiac: RRR with normal S1, S2.   Lungs: Clear to auscultation bilaterally. No rales. No wheezing. No rhonchi.  Psych discussed stress and depression       Lab Results   Component Value Date    CHOLESTEROL 194 12/15/2022    HDLCHOL 42 12/15/2022    LDLCHOL 90 12/15/2022    TRIG 310 (H) 12/15/2022       COMPLETE BLOOD COUNT   Lab Results   Component Value Date    WBC 4.8 12/15/2022    HGB 15.3 (H) 12/15/2022    HCT 45.3 (H) 12/15/2022    PLTCNT 211 12/15/2022       DIFFERENTIAL  Lab Results   Component Value Date    PMNS 69 12/15/2022    LYMPHOCYTES 21 12/15/2022    MONOCYTES 9 12/15/2022    EOSINOPHIL 1 12/15/2022    BASOPHILS 0 12/15/2022    BASOPHILS 0.00 12/15/2022    PMNABS 3.30 12/15/2022    LYMPHSABS 1.00 12/15/2022    EOSABS 0.00 12/15/2022    MONOSABS 0.40 12/15/2022        COMPREHENSIVE METABOLIC PANEL   Lab Results   Component Value Date    SODIUM 132 (L) 05/22/2023    POTASSIUM 4.4 05/22/2023    CHLORIDE 97 (L) 05/22/2023    CO2 29 05/22/2023    ANIONGAP 6 05/22/2023    BUN 19 05/22/2023    CREATININE 0.81 05/22/2023    GLUCOSENF 100 05/22/2023    CALCIUM 9.2 05/22/2023    ALB 4.3 06/17/2022    ALBUMIN 4.3 05/22/2023    TOTALPROTEIN 6.5 05/22/2023    ALKPHOS 122 (H) 05/22/2023    AST 17 05/22/2023    ALT 16 05/22/2023            THYROID STIMULATING HORMONE  Lab Results   Component Value Date  TSH 3.454 12/15/2022        Lab Results   Component Value Date    HA1C 4.9 06/17/2022     Lab  Results   Component Value Date    VITD 85 12/12/2021         Orders Placed This Encounter    ARIPiprazole (ABILIFY) 2 mg Oral Tablet      Assessment & Plan  Primary hypertension  BP reviewed and discussed   Continue  current treatment   Mixed hyperlipidemia  Continue lovaza and  Pravachol  20 mg daily  Current moderate episode of major depressive disorder without prior episode (CMS HCC)  Continue Wellbutrin but will do trial of Abilify 2mg  daily if no better in  72 hrs will increase to  2 tablets of 2mg     Fibromyositis  Stable on savelle  Acquired hypothyroidism  Continue synthroid 50 mcg one daily   Follow TSH   Generalized anxiety disorder  See medication changes   Recurrent cough  Folllows with dr Sherilyn Banker  keep fu     All meds and work up reviewed        Meds reviewed as well as labs.  Chart reviewed and updated.   Continue current treatment.  Keep follow-up appointment.   Vaccine hx reviewed.   [place] of Abilify  2mg   qhs continue wellbutrin   Consult Dr Sherilyn Banker reviewed   Last labs reviewed and stable

## 2023-05-20 ENCOUNTER — Ambulatory Visit (INDEPENDENT_AMBULATORY_CARE_PROVIDER_SITE_OTHER): Payer: Self-pay | Admitting: Internal Medicine

## 2023-05-22 ENCOUNTER — Encounter (INDEPENDENT_AMBULATORY_CARE_PROVIDER_SITE_OTHER): Payer: Self-pay | Admitting: NEUROLOGY

## 2023-05-22 ENCOUNTER — Ambulatory Visit: Payer: Commercial Managed Care - PPO | Attending: NEUROLOGY

## 2023-05-22 ENCOUNTER — Other Ambulatory Visit: Payer: Self-pay

## 2023-05-22 ENCOUNTER — Ambulatory Visit (INDEPENDENT_AMBULATORY_CARE_PROVIDER_SITE_OTHER): Payer: Commercial Managed Care - PPO | Admitting: NEUROLOGY

## 2023-05-22 VITALS — BP 147/79 | HR 97 | Temp 98.8°F | Ht 64.0 in | Wt 168.2 lb

## 2023-05-22 DIAGNOSIS — G500 Trigeminal neuralgia: Secondary | ICD-10-CM | POA: Insufficient documentation

## 2023-05-22 DIAGNOSIS — R202 Paresthesia of skin: Secondary | ICD-10-CM

## 2023-05-22 DIAGNOSIS — G4733 Obstructive sleep apnea (adult) (pediatric): Secondary | ICD-10-CM

## 2023-05-22 LAB — COMPREHENSIVE METABOLIC PANEL, NON-FASTING
ALBUMIN/GLOBULIN RATIO: 2 — ABNORMAL HIGH (ref 0.8–1.4)
ALBUMIN: 4.3 g/dL (ref 3.5–5.7)
ALKALINE PHOSPHATASE: 122 U/L — ABNORMAL HIGH (ref 34–104)
ALT (SGPT): 16 U/L (ref 7–52)
ANION GAP: 6 mmol/L (ref 4–13)
AST (SGOT): 17 U/L (ref 13–39)
BILIRUBIN TOTAL: 0.4 mg/dL (ref 0.3–1.0)
BUN/CREA RATIO: 23 — ABNORMAL HIGH (ref 6–22)
BUN: 19 mg/dL (ref 7–25)
CALCIUM, CORRECTED: 9 mg/dL (ref 8.9–10.8)
CALCIUM: 9.2 mg/dL (ref 8.6–10.3)
CHLORIDE: 97 mmol/L — ABNORMAL LOW (ref 98–107)
CO2 TOTAL: 29 mmol/L (ref 21–31)
CREATININE: 0.81 mg/dL (ref 0.60–1.30)
ESTIMATED GFR: 79 mL/min/{1.73_m2} (ref >59)
GLOBULIN: 2.2 — ABNORMAL LOW (ref 2.9–5.4)
GLUCOSE: 100 mg/dL (ref 74–109)
OSMOLALITY, CALCULATED: 267 mOsm/kg — ABNORMAL LOW (ref 270–290)
POTASSIUM: 4.4 mmol/L (ref 3.5–5.1)
PROTEIN TOTAL: 6.5 g/dL (ref 6.4–8.9)
SODIUM: 132 mmol/L — ABNORMAL LOW (ref 136–145)

## 2023-05-22 NOTE — Progress Notes (Unsigned)
ASSESSMENT  TRIGEMINAL NEURALGIA:  She is a 67 year old woman who is returns to clinic for 1 year of progressive facial pain and numbness on the right side.  Symptoms started somewhat insidiously with frank numbness before becoming painful and tender to touch.  Her exam is notable for sensory deficits in V2 on the right side. We did obtain an MRI and MRA of the head which were essentially unremarkable. Nerve conduction studies revealed decrease amplitudes of the right trigeminal nerve compared to the left. She has experienced significant response to Trileptal but has required increasing doses. We will increase to 900 mg BID but likely cannot increase further. I would consider addition of other agents or possible switch to lacosamide.   2.   EXTREMITY PARESTHESIAS:  She has a longer standing history of paresthesias in the arms and legs.  These can be painful at times.  Her exam was previously notable for mostly small fiber deficits in a patchy distribution in the lower extremities calling into question possible small fiber neuropathy which does have an association with fibromyalgia.  EMG revealed no evidence of a large fiber process. The addition of trileptal has helped with some of this. Rheumatologic workup has been unremarkable. We will continue to monitor. She also has some pain that develops with repetitive tasks in right hand concerning for carpel tunnel syndrome. There was no electrophysiologic evidence of this, but conductions may be normal in mild cases. She can trial wrist bracing at night.   3. MEMORY CHANGES: She reports decline in cognition since last visit. She does report a history of severe sleep apnea. Unfortunately she has not been able to tolerate CPAP due to the straps touching her face. I do think this is likely the etiology of her memory deficits as she has also noted increased fatigue. We will try to increase her Trileptal to get better control of her symptoms so hopefully she can tolerate PAP  therapy. I do recommend she establish with sleep medicine.     PLAN  1. Increase Trileptal to 900 mg BID      Will obtain repeat labs at next visit  2.  Continue to monitor       If worsening, could consider the utility of skin biopsy to confirm presence of SFN  3.  Increase Trileptal as above       Encouraged establishing with sleep medicine    Return to clinic 3 months    Thank you for allowing me to participate in your patient's care and please do not hesitate to contact me for any questions or concerns.    Patrick North, DO  Assistant Professor of Neurology  [facility]     [phone]: I will continue to be the provider focal point in managing the chronic complex neurological condition  ==========================================================================================================================================    NAME:  Jo Wong  DOB:  1956/02/08  VISIT DATE:  06/17/2022    CC:  Facial Numbness    Patient seen in consultation at the request of Dr. Lonia Farber  History obtained from the patient and chart/records  Age of patient:  67 y.o.    INTERVAL: Has dull pain in V2 distribution on the right and more intense pain in V1. Increase in Trileptal has been helpful, however, pain is still present most of the time. She also reports cognitive decline. Reports not being able to come up with names of good friends. She has had increased fatigue. She reports a history of  severe sleep apnea. Unfortunately, she has been unable to tolerate PAP therapy because of how the straps lay across her face.  Has also noted some balance issues. No falls.      HPI:   I had the pleasure of seeing your patient in neurology clinic for an outpatient consultation, who is a 67 y.o. year old female who was referred for evaluation of facial numbness.  Please allow me to summarize the history for the record.     She states that symptoms began in November 2022.  It started with numbness that  began under the right eye.  She did have a red spot in the same location at the time.  She does have a history of skin cancer and was given topical chemotherapeutic agent by her dermatologist to treat this.  Despite this, the numbness persisted and then began to extend down the right side of the face to the lip and laterally towards the ear.  Gradually it became painful and tender to touch.  She reports the even light stimulus now can cause pain on the right side of her face.  She denies any weakness.  She does report that her right eye seems to have had a very mild decline in visual acuity.  She denies any changes in taste.  She does have a previous history of headaches though nothing with similar symptoms.  She also reports more longstanding numbness and tingling in the arms and legs.  She does report a history of fibromyalgia.  She does have some lightheadedness with standing.  She does have issues with constipation and diarrhea as she has a history of IBS.    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    Headache, post-traumatic    Thyroid nodule    Thrombosis of mesenteric vein (CMS HCC)    Thromboembolic disorder (CMS HCC)    Fibromyositis    Multiple nodules of lung    IBS (irritable bowel syndrome)    Insomnia    Acquired hypothyroidism    Mixed hyperlipidemia    Hypertension    Major depressive disorder    Prolapsed cervical intervertebral disc    Cyst of ovary    Chronic pain    Generalized anxiety disorder    Actinic keratosis    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     Past Surgical History:   Procedure Laterality Date    BILATERAL OOPHORECTOMY      COLONOSCOPY  2017    Dr mal    HX CHOLECYSTECTOMY      HX LASER SKIN LESION REMOVAL      01/31/22    HX THYROIDECTOMY      HX TUBAL  LIGATION      SKIN GRAFT      12/2020  right upper arm    SKIN LESION EXCISION      removal of pigmented skin lesion    SQUAMOUS CELL CARCINOMA EXCISION      10/2019  left shoulder and right front shoulder 03/2021  back right         Family Medical History:       Problem Relation (Age of Onset)    Aortic Aneurysm Brother    Arthritis-rheumatoid Sister    Back Problems Brother    Blood Clots Mother    Breast Disease Sister    Cerebral Aneurysm Maternal Aunt  Deep vein thrombosis Sister    Heart Disease Mother    Hypertension (High Blood Pressure) Mother    Other Father    Primary Brain tumor Brother    Stroke Mother    Ulcers Brother            Current Outpatient Medications   Medication Sig Dispense Refill    albuterol sulfate (PROVENTIL HFA/VENTOLIN HFA) 90 mcg Inhalation Inhaler Take 2 Puffs by inhalation Every 6 hours as needed 18 g 3    ARIPiprazole (ABILIFY) 2 mg Oral Tablet Take 1 Tablet (2 mg total) by mouth Once a day 30 Tablet 1    budesonide-glycopyr-formoterol (BREZTRI AEROSPHERE) 160-9-4.8 mcg/actuation Inhalation HFA Aerosol Inhaler Take 2 Puffs by inhalation Twice daily      buPROPion (WELLBUTRIN XL) 300 mg extended release 24 hr tablet Take 1 Tablet (300 mg total) by mouth Once a day 90 Tablet 2    Cholecalciferol, Vitamin D3, 50 mcg (2,000 unit) Oral Capsule Take 1 Capsule by mouth Once a day      gabapentin (NEURONTIN) 600 mg Oral Tablet Take 1 Tablet (600 mg total) by mouth Twice daily 180 Tablet 1    levothyroxine (SYNTHROID) 50 mcg Oral Tablet TAKE 1 TABLET EVERY DAY 90 Tablet 3    losartan (COZAAR) 50 mg Oral Tablet Take 1 Tablet (50 mg total) by mouth Once a day for 90 days 90 Tablet 2    mecobalamin, vitamin B12, 1,000 mcg Oral Tablet, Chewable Chew 1 Tablet (1,000 mcg total) Once a day      milnacipran (SAVELLA) 100 mg Oral Tablet Take 1 Tablet (100 mg total) by mouth Twice daily 180 Tablet 2    montelukast (SINGULAIR) 10 mg Oral Tablet Take 1 Tablet (10 mg total) by mouth Every evening       nitroGLYCERIN (NITROSTAT) 0.4 mg Sublingual Tablet, Sublingual Place 1 Tablet (0.4 mg total) under the tongue Every 5 minutes as needed for Chest pain for 3 doses over 15 minutes      omega-3 fatty acid (LOVAZA) 1 gram Oral Capsule Take 1 Capsule (1 g total) by mouth Once a day      OXcarbazepine (TRILEPTAL) 300 mg Oral Tablet Take 3 Tablets (900 mg total) by mouth Twice daily for 180 days Indications: facial nerve pain 180 Tablet 5    pantoprazole (PROTONIX) 40 mg Oral Tablet, Delayed Release (E.C.) TAKE 1 TABLET EVERY DAY 90 Tablet 3    pravastatin (PRAVACHOL) 20 mg Oral Tablet TAKE 1 TABLET EVERY DAY 90 Tablet 3     No current facility-administered medications for this visit.     Allergies   Allergen Reactions    Doxycycline  Other Adverse Reaction (Add comment)     Sweats and flushing    vomiting    Duloxetine  Other Adverse Reaction (Add comment)     Unk.     Social History     Socioeconomic History    Marital status: Married     Spouse name: Not on file    Number of children: Not on file    Years of education: Not on file    Highest education level: Not on file   Occupational History    Not on file   Tobacco Use    Smoking status: Never    Smokeless tobacco: Never   Vaping Use    Vaping status: Never Used   Substance and Sexual Activity    Alcohol use: Never    Drug use: Never  Sexual activity: Not on file   Other Topics Concern    Not on file   Social History Narrative    Not on file     Social Determinants of Health     Financial Resource Strain: Low Risk  (03/04/2022)    Financial Resource Strain     SDOH Financial: No   Transportation Needs: Low Risk  (03/04/2022)    Transportation Needs     SDOH Transportation: No   Social Connections: Low Risk  (03/04/2022)    Social Connections     SDOH Social Isolation: 5 or more times a week   Intimate Partner Violence: Low Risk  (03/04/2022)    Intimate Partner Violence     SDOH Domestic Violence: No   Housing Stability: Low Risk  (03/04/2022)    Housing Stability      SDOH Housing Situation: I have housing.     SDOH Housing Worry: No       ============================================================================================================================================  GENERAL EXAMINATION  There were no vitals taken for this visit.  Vital signs personally reviewed  General: No acute distress, alert  HEENT: Normocephalic, no scleral icterus  Extremities: No significant edema, No cyanosis    NEUROLOGIC EXAM  On neurological exam, patient was awake, alert and answering questions appropriately  Speech was fluent, without dysarthria or aphasia.    CN  II:  Visual fields intact to confrontation  III, IV, VI: extraocular movements intact without nystagmus  V:  Diminished over  V1, V2 distribution on the right  VII: face symmetric without weakness  VIII: grossly intact  IX, X: symmetric palatal elevation  XI: normal strength of trapezius and sternocleidomastoid bilaterally  XII: tongue midline with full movements    MOTOR  Bulk: normal  Abnormal Movements: none    Strength:     MRC Grading Scale   Right Left   Deltoid 5 5   Biceps 5 5   Triceps 5 5   Wrist Extension - -   Wrist Flexion - -   Finger Extension - -   Finger Abduction - -   Finger Flexion - -   Hip Flexion 5 5   Hip Extension - -   Hip Abduction - -   Hip Adduction - -   Knee Extension 5 5   Knee Flexion 5 5   Ankle Dorsiflexion - -   Ankle Plantarflexion - -   Toe Extension - -   Toe Flexion - -             REFLEXES   Right Left   Biceps 2 2   Triceps 2 2   Brachioradialis 2 2   Patellar 2 2   Achilles 1 1   Plantar - -   Hoffman - -   Pectoralis - -   Jaw Jerk - -       SENSORY  Light touch: intact throughout all extremities    GAIT  General: casual, normal gait    ================================================================================================================================LABS  Personal Review of prior labs is notable for:   2024  CMP WNL   TSH normal  CBC largely WNL   2023  Hepatitis panel  negative  Vitamin-D within normal limits   LDL 127   TSH within normal limits   CMP largely within normal limits   CBC largely within normal limits  SSA/B antibodies negative   Rheumatoid factor negative   Anti CCP antibody negative   ANA negative   SPEP within normal limits  B12 greater than 1500   A1c 4.9  IMAGING  Personal Review of imaging is notable for:    MRI Brain w/o contrast 07/17/2022:  Mild degree of atrophy likely in keeping with age otherwise essentially unremarkable study   MRA Head July 17, 2022:  No vascular compression of the trigeminal nerve on the right  OTHER DIAGNOSTICS  Personal Review of other prior diagnostics is notable for:  Not applicable

## 2023-05-23 NOTE — Assessment & Plan Note (Addendum)
Continue synthroid 50 mcg one daily   Follow TSH

## 2023-05-23 NOTE — Assessment & Plan Note (Addendum)
Continue lovaza and  Pravachol  20 mg daily

## 2023-05-23 NOTE — Assessment & Plan Note (Addendum)
Stable on savelle

## 2023-05-23 NOTE — Assessment & Plan Note (Addendum)
Continue Wellbutrin but will do trial of Abilify 2mg  daily if no better in  72 hrs will increase to  2 tablets of 2mg 

## 2023-05-23 NOTE — Assessment & Plan Note (Signed)
See medication changes.

## 2023-05-23 NOTE — Assessment & Plan Note (Addendum)
BP reviewed and discussed   Continue  current treatment

## 2023-05-27 ENCOUNTER — Encounter (INDEPENDENT_AMBULATORY_CARE_PROVIDER_SITE_OTHER): Payer: Self-pay | Admitting: Internal Medicine

## 2023-05-27 ENCOUNTER — Encounter (INDEPENDENT_AMBULATORY_CARE_PROVIDER_SITE_OTHER): Payer: Self-pay | Admitting: NEUROLOGY

## 2023-05-30 ENCOUNTER — Other Ambulatory Visit (INDEPENDENT_AMBULATORY_CARE_PROVIDER_SITE_OTHER): Payer: Self-pay | Admitting: Internal Medicine

## 2023-06-09 ENCOUNTER — Other Ambulatory Visit: Payer: Self-pay

## 2023-06-09 ENCOUNTER — Ambulatory Visit: Payer: Commercial Managed Care - PPO | Attending: Internal Medicine | Admitting: Internal Medicine

## 2023-06-09 ENCOUNTER — Encounter (INDEPENDENT_AMBULATORY_CARE_PROVIDER_SITE_OTHER): Payer: Self-pay | Admitting: Internal Medicine

## 2023-06-09 DIAGNOSIS — J8283 Eosinophilic asthma: Secondary | ICD-10-CM

## 2023-06-09 DIAGNOSIS — F321 Major depressive disorder, single episode, moderate: Secondary | ICD-10-CM

## 2023-06-09 DIAGNOSIS — R058 Other specified cough: Secondary | ICD-10-CM

## 2023-06-09 MED ORDER — ARIPIPRAZOLE 5 MG TABLET
5.0000 mg | ORAL_TABLET | Freq: Every day | ORAL | 3 refills | Status: DC
Start: 2023-06-09 — End: 2023-06-10

## 2023-06-09 NOTE — Progress Notes (Addendum)
[facility]  [address]  [place] [address]  Operated by [facility]  History and Physical    Name: ROZINA ELLIOTT MRN:  [id number]   Date: 06/09/2023 DOB:  05-Apr-1956 (67 y.o.)         Name: SHELESE GEESAMAN                       Date of Birth: Apr 23, 1956   MRN:  [id number]                         Date of visit: 06/09/2023     PCP: [name], PA-C     Subjective  DSHANTI KARREN is a 67 y.o. year old female who presents for Cough and Depression   to clinic.  No specialty comments available.   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 11/08/2021    Headache, post-traumatic 11/08/2021    Thyroid nodule 11/08/2021    Thrombosis of mesenteric vein (CMS HCC) 11/08/2021    Thromboembolic disorder (CMS HCC) 11/08/2021    Fibromyositis 11/08/2021    Multiple nodules of lung 11/08/2021    IBS (irritable bowel syndrome) 11/08/2021    Insomnia 11/08/2021    Acquired hypothyroidism 11/08/2021    Mixed hyperlipidemia 11/08/2021    Hypertension 11/08/2021    Major depressive disorder 11/08/2021    Prolapsed cervical intervertebral disc 11/08/2021    Cyst of ovary 11/08/2021    Chronic pain 11/08/2021    Generalized anxiety disorder 11/08/2021    Actinic keratosis 11/08/2021      Current Outpatient Medications   Medication Sig    albuterol sulfate (PROVENTIL HFA/VENTOLIN HFA) 90 mcg Inhalation Inhaler Take 2 Puffs by inhalation Every 6 hours as needed    ARIPiprazole (ABILIFY) 5 mg Oral Tablet Take 1 Tablet (5 mg total) by mouth Once a day    budesonide-glycopyr-formoterol (BREZTRI AEROSPHERE) 160-9-4.8 mcg/actuation Inhalation HFA Aerosol Inhaler Take 2 Puffs by inhalation Twice daily    buPROPion (WELLBUTRIN XL) 300 mg extended release 24 hr tablet Take 1 Tablet (300 mg total) by mouth Once a day    Cholecalciferol, Vitamin D3,  50 mcg (2,000 unit) Oral Capsule Take 1 Capsule by mouth Once a day    fluticasone-umeclidin-vilanter (TRELEGY ELLIPTA) 100-62.5-25 mcg Inhalation Disk with Device Take 1 Inhalation by inhalation Once a day    gabapentin (NEURONTIN) 600 mg Oral Tablet Take 1 Tablet (600 mg total) by mouth Twice daily    levothyroxine (SYNTHROID) 50 mcg Oral Tablet TAKE 1 TABLET EVERY DAY    losartan (COZAAR) 50 mg Oral Tablet TAKE 1 TABLET ONE TIME DAILY    mecobalamin, vitamin B12, 1,000 mcg Oral Tablet, Chewable Chew 1 Tablet (1,000 mcg total) Once a day    milnacipran (SAVELLA) 100 mg Oral Tablet Take 1 Tablet (100 mg total) by mouth Twice daily    montelukast (SINGULAIR) 10 mg Oral Tablet Take 1 Tablet (10 mg total) by mouth Every evening    nitroGLYCERIN (NITROSTAT) 0.4 mg Sublingual Tablet, Sublingual Place 1 Tablet (0.4 mg total) under the tongue Every 5 minutes as needed for Chest pain for 3 doses over 15 minutes    omega-3 fatty acid (LOVAZA) 1 gram Oral Capsule Take 1 Capsule (1 g total)  by mouth Once a day    OXcarbazepine (TRILEPTAL) 300 mg Oral Tablet Take 3 Tablets (900 mg total) by mouth Twice daily for 180 days Indications: facial nerve pain    pantoprazole (PROTONIX) 40 mg Oral Tablet, Delayed Release (E.C.) TAKE 1 TABLET EVERY DAY    pravastatin (PRAVACHOL) 20 mg Oral Tablet TAKE 1 TABLET EVERY DAY            Fu Visit  Telemed          Chronic Disease Management-AMB  Follow-up visit    Recurrent  cough  Co still cough  no different  and says she is coughing   up  some sputum and  now  very hoarse         Will ask for ENT to  get a good visualization   due to above     Dr  Sherilyn Banker tessalon  pearls  q 8 hrs but on hs only helps some      FU MDD     Follow up Depression patient takes Wellbutrin 300 daily  still feeling down  dsicussed options       Abilify added last visit  at   2mg   and pt  is doing some better with loss of her  brother and other things going on but  she  feels we can increase dose      Continue   Wellbutrin  as well      Seen by  Dr Sedalia Muta  now on trileptal for trigeminal neuralgia  and  work up reviewed       Pt says  she tries to say words and they dont come out  and she will discuss this  with him     One time  was at red [place] and could not remember where she was going     Seen by ENT no new orders       FU Fibromyalgia   Pt is down to  one  daily  of savelle due to cost  on for months and stable  but  will be going off due to cost        Fu eosinophila  asthma sees dr Sherilyn Banker  on symbicort but still not great  will add albuterol  until she fu    meds discussed     ie xolair  or nucala  to discuss with Dr Sherilyn Banker  when she has fu      On Trelegy     On fasenra      Follow-up neuropathy and fibromyalgia patient is on gabapentin 600 mg twice daily along with Savella    gabapentin  level has been don  7/21    FU IBS- C  bm currently stable              REVIEW OF SYSTEMS:   Review of Systems  Review of Systems have been reviewed  ROS  Const  Reports system reviewed and no additional complaints, except as documented  ENT  Reports system reviewed and no additional complaints, except as documented  Resp  Reports system reviewed and no additional complaints, except as documented  GI  Reports system reviewed and no additional complaints, except as documented   Objective:   There were no vitals taken for this visit.             PHYSICAL EXAM  Physical Exam  Gen: NAD. Alert.   Exam  Const  General: cooperative, no  acute distress and alert  Resp  Effort & Inspection: able to speak in complete sentences  Psych  Mental Status: mental status grossly normal  Mood: congruent mood  Affect: normal affect  Attitude: cooperative  Insight: insight good  Judgment: judgment good     Assessment & Plan  Recurrent cough    Current moderate episode of major depressive disorder without prior episode (CMS HCC)    Eosinophilic asthma     Time with pt on phone       Pt feels Abilify is working but not good enough   will increase  to   5mg  daily    Meds and labs reviewed  Discussed  cough and  changes  pt has seen Dr Sherilyn Banker   pt to  fu  re  eosinophilia  Can use  tessalon pearl bid  to tid    I personally offered the service to the patient, and obtained verbal consent to provide this service.    [name], PA-C

## 2023-06-10 ENCOUNTER — Other Ambulatory Visit (INDEPENDENT_AMBULATORY_CARE_PROVIDER_SITE_OTHER): Payer: Self-pay | Admitting: Internal Medicine

## 2023-06-10 MED ORDER — ARIPIPRAZOLE 5 MG TABLET
5.0000 mg | ORAL_TABLET | Freq: Every day | ORAL | 3 refills | Status: DC
Start: 2023-06-10 — End: 2023-09-17

## 2023-06-11 ENCOUNTER — Other Ambulatory Visit (INDEPENDENT_AMBULATORY_CARE_PROVIDER_SITE_OTHER): Payer: Self-pay | Admitting: Internal Medicine

## 2023-06-16 ENCOUNTER — Encounter (INDEPENDENT_AMBULATORY_CARE_PROVIDER_SITE_OTHER): Payer: Self-pay | Admitting: NEUROLOGY

## 2023-06-16 ENCOUNTER — Other Ambulatory Visit: Payer: Commercial Managed Care - PPO | Attending: NEUROLOGY

## 2023-06-16 ENCOUNTER — Other Ambulatory Visit: Payer: Self-pay

## 2023-06-16 DIAGNOSIS — G500 Trigeminal neuralgia: Secondary | ICD-10-CM | POA: Insufficient documentation

## 2023-06-16 LAB — COMPREHENSIVE METABOLIC PANEL, NON-FASTING
ALBUMIN/GLOBULIN RATIO: 2.2 — ABNORMAL HIGH (ref 0.8–1.4)
ALBUMIN: 4.4 g/dL (ref 3.5–5.7)
ALKALINE PHOSPHATASE: 128 U/L — ABNORMAL HIGH (ref 34–104)
ALT (SGPT): 17 U/L (ref 7–52)
ANION GAP: 7 mmol/L (ref 4–13)
AST (SGOT): 15 U/L (ref 13–39)
BILIRUBIN TOTAL: 0.3 mg/dL (ref 0.3–1.0)
BUN/CREA RATIO: 20 (ref 6–22)
BUN: 16 mg/dL (ref 7–25)
CALCIUM, CORRECTED: 8.8 mg/dL — ABNORMAL LOW (ref 8.9–10.8)
CALCIUM: 9.1 mg/dL (ref 8.6–10.3)
CHLORIDE: 106 mmol/L (ref 98–107)
CO2 TOTAL: 26 mmol/L (ref 21–31)
CREATININE: 0.81 mg/dL (ref 0.60–1.30)
ESTIMATED GFR: 79 mL/min/{1.73_m2} (ref >59)
GLOBULIN: 2 — ABNORMAL LOW (ref 2.9–5.4)
GLUCOSE: 110 mg/dL — ABNORMAL HIGH (ref 74–109)
OSMOLALITY, CALCULATED: 279 mOsm/kg (ref 270–290)
POTASSIUM: 4.1 mmol/L (ref 3.5–5.1)
PROTEIN TOTAL: 6.4 g/dL (ref 6.4–8.9)
SODIUM: 139 mmol/L (ref 136–145)

## 2023-06-17 ENCOUNTER — Encounter (INDEPENDENT_AMBULATORY_CARE_PROVIDER_SITE_OTHER): Payer: Self-pay | Admitting: Internal Medicine

## 2023-06-17 DIAGNOSIS — R058 Other specified cough: Secondary | ICD-10-CM

## 2023-06-17 DIAGNOSIS — R0982 Postnasal drip: Secondary | ICD-10-CM

## 2023-06-23 ENCOUNTER — Encounter (INDEPENDENT_AMBULATORY_CARE_PROVIDER_SITE_OTHER): Payer: Self-pay | Admitting: OTOLARYNGOLOGY

## 2023-06-23 ENCOUNTER — Other Ambulatory Visit: Payer: Self-pay

## 2023-06-23 ENCOUNTER — Ambulatory Visit: Payer: Commercial Managed Care - PPO | Attending: OTOLARYNGOLOGY | Admitting: OTOLARYNGOLOGY

## 2023-06-23 VITALS — Ht 64.0 in | Wt 168.0 lb

## 2023-06-23 DIAGNOSIS — J343 Hypertrophy of nasal turbinates: Secondary | ICD-10-CM | POA: Insufficient documentation

## 2023-06-23 DIAGNOSIS — R058 Other specified cough: Secondary | ICD-10-CM

## 2023-06-23 DIAGNOSIS — J3489 Other specified disorders of nose and nasal sinuses: Secondary | ICD-10-CM | POA: Insufficient documentation

## 2023-06-23 DIAGNOSIS — R0982 Postnasal drip: Secondary | ICD-10-CM

## 2023-06-23 DIAGNOSIS — J342 Deviated nasal septum: Secondary | ICD-10-CM | POA: Insufficient documentation

## 2023-06-23 DIAGNOSIS — G501 Atypical facial pain: Secondary | ICD-10-CM | POA: Insufficient documentation

## 2023-06-23 DIAGNOSIS — J329 Chronic sinusitis, unspecified: Secondary | ICD-10-CM | POA: Insufficient documentation

## 2023-06-23 DIAGNOSIS — J8283 Eosinophilic asthma: Secondary | ICD-10-CM | POA: Insufficient documentation

## 2023-06-23 DIAGNOSIS — Z79899 Other long term (current) drug therapy: Secondary | ICD-10-CM | POA: Insufficient documentation

## 2023-06-23 DIAGNOSIS — K219 Gastro-esophageal reflux disease without esophagitis: Secondary | ICD-10-CM | POA: Insufficient documentation

## 2023-06-23 DIAGNOSIS — J3089 Other allergic rhinitis: Secondary | ICD-10-CM | POA: Insufficient documentation

## 2023-06-23 DIAGNOSIS — J309 Allergic rhinitis, unspecified: Secondary | ICD-10-CM

## 2023-06-23 DIAGNOSIS — R053 Chronic cough: Secondary | ICD-10-CM | POA: Insufficient documentation

## 2023-06-23 MED ORDER — FAMOTIDINE 40 MG TABLET
40.0000 mg | ORAL_TABLET | Freq: Every evening | ORAL | 5 refills | Status: AC
Start: 2023-06-23 — End: ?

## 2023-06-23 MED ORDER — TRIAMCINOLONE ACETONIDE 55 MCG NASAL SPRAY AEROSOL
2.0000 | INHALATION_SPRAY | Freq: Every day | NASAL | 5 refills | Status: AC
Start: 2023-06-23 — End: ?

## 2023-06-23 NOTE — H&P (Signed)
ENT, [facility]  [address]  [place] [address]  Operated by [facility]      Name: Jo Wong MRN:  [id number]   Date: 06/23/2023 DOB: 1956-02-28 (67 y.o.)       Referring Provider:  No ref. provider found    Reason for Visit:   Chief Complaint   Patient presents with    Cough     Per pt having a cough that doesn't stop. States they told her it wasn't due to her asthma.         History of Present Illness:  Jo Wong is a 67 y.o. female who is FU on new c/o cough x 2-3 years, and getting worse. She coughs day and night.   Her pulmonologist left so not established with one currently. It is usually a dry cough. She sees Dr. Sherilyn Banker for allergies.  Was given Jerilynn Som however this has not improved her symptoms.  Sinus CT 06/06/22-- PNS clear. Denies fever, chills, facial pressure, discolored mucus, dysphagia.        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    Headache, post-traumatic    Thyroid nodule    Thrombosis of mesenteric vein (CMS HCC)    Thromboembolic disorder (CMS HCC)    Fibromyositis    Multiple nodules of lung    IBS (irritable bowel syndrome)    Insomnia    Acquired hypothyroidism    Mixed hyperlipidemia    Hypertension    Major depressive disorder    Prolapsed cervical intervertebral disc    Cyst of ovary    Chronic pain    Generalized anxiety disorder    Actinic keratosis    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     Current Outpatient Medications   Medication Sig    albuterol sulfate (PROVENTIL HFA/VENTOLIN HFA) 90 mcg Inhalation Inhaler Take 2 Puffs by inhalation Every 6 hours as needed    ARIPiprazole (ABILIFY) 5 mg Oral Tablet Take 1 Tablet (5 mg total) by mouth Once a day    Benzonatate (TESSALON) 200 mg Oral Capsule Take 1 Capsule (200 mg total) by mouth Once a day    budesonide-glycopyr-formoterol (BREZTRI AEROSPHERE) 160-9-4.8 mcg/actuation  Inhalation HFA Aerosol Inhaler Take 2 Puffs by inhalation Twice daily    buPROPion (WELLBUTRIN XL) 300 mg extended release 24 hr tablet Take 1 Tablet (300 mg total) by mouth Once a day    Cholecalciferol, Vitamin D3, 50 mcg (2,000 unit) Oral Capsule Take 1 Capsule by mouth Once a day    clotrimazole (MYCELEX) 10 mg Mucous Membrane Troche DISSOLVE 1 TABLET BY MOUTH 4 TIMES DAILY (AFTER MEALS AND AT BEDTIME)    famotidine (PEPCID) 40 mg Oral Tablet Take 1 Tablet (40 mg total) by mouth Every evening    fluticasone-umeclidin-vilanter (TRELEGY ELLIPTA) 100-62.5-25 mcg Inhalation Disk with Device Take 1 Inhalation by inhalation Once a day    gabapentin (NEURONTIN) 600 mg Oral Tablet Take 1 Tablet (600 mg total) by mouth Twice daily    levothyroxine (SYNTHROID) 50 mcg Oral Tablet TAKE 1 TABLET EVERY DAY    losartan (COZAAR) 50 mg Oral Tablet TAKE 1 TABLET ONE TIME DAILY    mecobalamin, vitamin B12, 1,000 mcg Oral Tablet, Chewable Chew 1 Tablet (1,000 mcg total) Once a day    milnacipran (SAVELLA) 100 mg Oral Tablet Take 1 Tablet (100 mg total)  by mouth Twice daily    montelukast (SINGULAIR) 10 mg Oral Tablet Take 1 Tablet (10 mg total) by mouth Every evening    nitroGLYCERIN (NITROSTAT) 0.4 mg Sublingual Tablet, Sublingual Place 1 Tablet (0.4 mg total) under the tongue Every 5 minutes as needed for Chest pain for 3 doses over 15 minutes    omega-3 fatty acid (LOVAZA) 1 gram Oral Capsule Take 1 Capsule (1 g total) by mouth Once a day    OXcarbazepine (TRILEPTAL) 300 mg Oral Tablet Take 3 Tablets (900 mg total) by mouth Twice daily for 180 days Indications: facial nerve pain    pantoprazole (PROTONIX) 40 mg Oral Tablet, Delayed Release (E.C.) TAKE 1 TABLET EVERY DAY    pravastatin (PRAVACHOL) 20 mg Oral Tablet TAKE 1 TABLET EVERY EVENING    Triamcinolone Acetonide (NASACORT) 55 mcg Nasal Aerosol, Spray Administer 2 Sprays into affected nostril(s) Once a day    zolpidem (AMBIEN) 5 mg Oral Tablet 03/08/2010 Zolpidem Oral  Tablet  PO 1.0 TABLET(S) QHS PRN sleep  03/08/2010  active      Allergies   Allergen Reactions    Doxycycline  Other Adverse Reaction (Add comment)     Sweats and flushing    vomiting    Duloxetine  Other Adverse Reaction (Add comment)     Unk.     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     Stomach cramps     Thromboembolic disorder (CMS HCC)     Thrombosis of mesenteric vein (CMS HCC)     Thyroid nodule      Past Surgical History:   Procedure Laterality Date    BILATERAL OOPHORECTOMY      COLONOSCOPY  2017    Dr mal    HX CHOLECYSTECTOMY      HX LASER SKIN LESION REMOVAL      01/31/22    HX THYROIDECTOMY      HX TUBAL LIGATION      SKIN GRAFT      12/2020  right upper arm    SKIN LESION EXCISION      removal of pigmented skin lesion    SQUAMOUS CELL CARCINOMA EXCISION      10/2019  left shoulder and right front shoulder 03/2021  back right     Family Medical History:       Problem Relation (Age of Onset)    Aortic Aneurysm Brother    Arthritis-rheumatoid Sister    Back Problems Brother    Blood Clots Mother    Breast Disease Sister    Cerebral Aneurysm Maternal Aunt    Deep vein thrombosis Sister    Heart Disease Mother    Hypertension (High Blood Pressure) Mother    Other Father  Primary Brain tumor Brother    Stroke Mother    Ulcers Brother            Social History     Tobacco Use    Smoking status: Never    Smokeless tobacco: Never   Vaping Use    Vaping status: Never Used    Substance Use Topics    Alcohol use: Never    Drug use: Never       Review of Systems:  Review of Systems    Physical Exam:  Ht 1.626 m (5\' 4" )   Wt 76.2 kg (168 lb)   BMI 28.84 kg/m       Physical Exam  Constitutional:       Appearance: Normal appearance. She is well-developed, well-groomed and normal weight.   HENT:      Head: Normocephalic and atraumatic.      Right Ear: Hearing, tympanic membrane, ear canal and external ear normal.      Left Ear: Hearing, tympanic membrane, ear canal and external ear normal.      Nose: Septal deviation and mucosal edema present.      Right Turbinates: Enlarged.      Left Turbinates: Enlarged.      Mouth/Throat:      Lips: Pink.      Mouth: Mucous membranes are moist.      Pharynx: Oropharynx is clear. Uvula midline.   Eyes:      Extraocular Movements: Extraocular movements intact.   Neck:      Trachea: Phonation normal.      Comments: [facility] s/p hemithyroidectomy  Pulmonary:      Effort: Pulmonary effort is normal.   Musculoskeletal:      Cervical back: Normal range of motion and neck supple.   Lymphadenopathy:      Cervical: No cervical adenopathy.   Skin:     General: Skin is warm.   Neurological:      Mental Status: She is alert and oriented to person, place, and time.      Cranial Nerves: Cranial nerves 2-12 are intact. No facial asymmetry.   Psychiatric:         Attention and Perception: Attention normal.         Mood and Affect: Mood normal.         Speech: Speech normal.         Behavior: Behavior normal. Behavior is cooperative.          Assessment:  ENCOUNTER DIAGNOSES     ICD-10-CM   1. Chronic cough  R05.3   2. Facial pain, atypical  G50.1   3. Chronic rhinosinusitis  J32.9   4. Nasal turbinate hypertrophy  J34.3   5. Non-seasonal allergic rhinitis due to other allergic trigger  J30.89   6. Concha bullosa  J34.89   7. Deviated nasal septum  J34.2   8. Eosinophilic asthma  Q76.19   9. Laryngopharyngeal reflux (LPR)  K21.9       Plan:  Referral notes and Medical  records reviewed on 06/23/2023.  Refer to pulmonology re:  Chronic cough, patient's previous pulmonologist retired  Rx Nasacort  Nasal saline b.i.d.  Continue Singulair daily  Diet modifications/reflux precautions  Continue Protonix q.a.m.  Rx Pepcid 40 q.h.s.  Orders Placed This Encounter    31575 - LARYNGOSCOPY, FLEXIBLE DIAGNOSTIC (AMB ONLY)    famotidine (PEPCID) 40 mg Oral Tablet    Triamcinolone Acetonide (NASACORT) 55 mcg Nasal Aerosol, Spray     Follow-up  in 3 months or sooner PRN      The advanced practice clinician's documentation was reviewed/amended in its entirety with the assessment and plan portion completely performed independently by me during this encounter.   Lonia Farber, D.O., MMS  ENT / Facial Plastic Surgery      Marcelline Deist, PA-C    I appreciate the opportunity to be involved in the care of your patients.  If you have any questions or concerns regarding this encounter, please do not hesitate to contact me at your convenience.        This note may have been partially generated using MModal Fluency Direct system, and there may be some incorrect words, spellings, and punctuation that were not noted in checking the note before saving, though effort was made to avoid such errors.

## 2023-06-23 NOTE — Procedures (Signed)
[facility], [facility]  [address]  [place] [address]  Operated by [facility]  Procedure Note    Name: Jo Wong MRN:  [id number]   Date: 06/23/2023 DOB:  [birth year] (67 y.o.)         31575 - LARYNGOSCOPY, FLEXIBLE DIAGNOSTIC (AMB ONLY)    Performed by: Lonia Farber, DO  Authorized by: Lonia Farber, DO    Time Out:     Immediately before the procedure, a time out was called:  Yes    Patient verified:  Yes    Procedure Verified:  Yes    Site Verified:  Yes  Indications for procedure: Unable to fully visualize laryngeal anatomy on indirect laryngoscopy and Chronic cough    Anesthesia:  Topical lidocaine/ phenylephrine     Description: The flexible endoscope was gently introduced into the nostril and passed along the floor of the nose to the nasopharynx. Adenoid pad and Eustachian tube orifices. The retropalatal airway was patent.    The endoscope was passed to the oropharynx.  Base of tongue/lingual tonsils without masses or lesions, patent valelulla, and sharply defined upright epiglottis. Retrolingual airway was patent.    The larynx displayed normal true vocal cords with good mobility. False cords were normal. Arytenoid mucosa was mildly erythematous and edematous.    The piriform recesses were symmetric without secretion. The hypopharynx was symmetric without lesion.    Findings:  Mild LPR findings, no concerning masses or lesions    The patient tolerated the procedure well    This note may have been partially generated using MModal Fluency Direct system, and there may be some incorrect words, spellings, and punctuation that were not noted in checking the note before saving, though effort was made to avoid such errors.      Lonia Farber, DO

## 2023-06-23 NOTE — Addendum Note (Signed)
Addended byGearldine Shown on: 06/23/2023 04:15 PM     Modules accepted: Orders

## 2023-06-30 ENCOUNTER — Encounter (INDEPENDENT_AMBULATORY_CARE_PROVIDER_SITE_OTHER): Payer: Self-pay | Admitting: Internal Medicine

## 2023-07-17 ENCOUNTER — Other Ambulatory Visit (INDEPENDENT_AMBULATORY_CARE_PROVIDER_SITE_OTHER): Payer: Self-pay | Admitting: Internal Medicine

## 2023-07-17 MED ORDER — BREZTRI AEROSPHERE 160 MCG-9MCG-4.8MCG/ACTUATION HFA AEROSOL INHALER
2.0000 | INHALATION_SPRAY | Freq: Two times a day (BID) | RESPIRATORY_TRACT | 2 refills | Status: DC
Start: 2023-07-17 — End: 2024-01-07

## 2023-07-22 ENCOUNTER — Ambulatory Visit (INDEPENDENT_AMBULATORY_CARE_PROVIDER_SITE_OTHER): Payer: Self-pay | Admitting: Internal Medicine

## 2023-07-28 ENCOUNTER — Encounter (INDEPENDENT_AMBULATORY_CARE_PROVIDER_SITE_OTHER): Payer: Self-pay | Admitting: Internal Medicine

## 2023-07-28 ENCOUNTER — Other Ambulatory Visit: Payer: Self-pay

## 2023-07-28 ENCOUNTER — Ambulatory Visit: Payer: Commercial Managed Care - PPO | Attending: Internal Medicine | Admitting: Internal Medicine

## 2023-07-28 VITALS — BP 130/83 | HR 91 | Temp 98.6°F | Ht 64.0 in

## 2023-07-28 DIAGNOSIS — G894 Chronic pain syndrome: Secondary | ICD-10-CM | POA: Insufficient documentation

## 2023-07-28 DIAGNOSIS — G500 Trigeminal neuralgia: Secondary | ICD-10-CM | POA: Insufficient documentation

## 2023-07-28 DIAGNOSIS — I10 Essential (primary) hypertension: Secondary | ICD-10-CM | POA: Insufficient documentation

## 2023-07-28 DIAGNOSIS — R053 Chronic cough: Secondary | ICD-10-CM | POA: Insufficient documentation

## 2023-07-28 DIAGNOSIS — Z23 Encounter for immunization: Secondary | ICD-10-CM | POA: Insufficient documentation

## 2023-07-28 DIAGNOSIS — S0300XA Dislocation of jaw, unspecified side, initial encounter: Secondary | ICD-10-CM | POA: Insufficient documentation

## 2023-07-28 DIAGNOSIS — E039 Hypothyroidism, unspecified: Secondary | ICD-10-CM | POA: Insufficient documentation

## 2023-07-28 DIAGNOSIS — M797 Fibromyalgia: Secondary | ICD-10-CM | POA: Insufficient documentation

## 2023-07-28 DIAGNOSIS — G4733 Obstructive sleep apnea (adult) (pediatric): Secondary | ICD-10-CM | POA: Insufficient documentation

## 2023-07-28 DIAGNOSIS — E782 Mixed hyperlipidemia: Secondary | ICD-10-CM | POA: Insufficient documentation

## 2023-07-28 LAB — THYROID STIMULATING HORMONE (SENSITIVE TSH): TSH: 2.59 u[IU]/mL (ref 0.450–5.330)

## 2023-07-28 LAB — MANUAL DIFFERENTIAL
EOSINOPHIL %: 2 % (ref 0–7)
EOSINOPHIL ABSOLUTE: 0.1 10*3/uL (ref 0.00–0.80)
EOSINOPHILS MANUAL: 2
LYMPHOCYTE %: 23 % — ABNORMAL LOW (ref 25–45)
LYMPHOCYTE ABSOLUTE: 1.15 10*3/uL (ref 1.10–5.00)
LYMPHOCYTES MANUAL: 23
METAMYELOCYTE %: 1 %
METAMYELOCYTE ABSOLUTE: 0.05 10*3/uL
METAMYELOCYTES MANUAL: 1
MONOCYTE %: 9 % (ref 0–12)
MONOCYTE ABSOLUTE: 0.45 10*3/uL (ref 0.00–1.30)
MONOCYTES MANUAL: 9
MYELOCYTE %: 2 %
MYELOCYTE ABSOLUTE: 0.1 10*3/uL
MYELOCYTES MANUAL: 2
NEUTROPHIL %: 63 % (ref 40–76)
NEUTROPHIL ABSOLUTE: 3.15 10*3/uL (ref 1.80–8.40)
NEUTROPHILS MANUAL: 63
PLATELET MORPHOLOGY COMMENT: NORMAL
RBC MORPHOLOGY COMMENT: NORMAL
TOTAL CELLS COUNTED [#] IN BLOOD: 100
WBC: 5 10*3/uL

## 2023-07-28 LAB — CBC WITH DIFF
HCT: 41.9 % (ref 31.2–41.9)
HGB: 14 g/dL (ref 10.9–14.3)
MCH: 29.1 pg (ref 24.7–32.8)
MCHC: 33.5 g/dL (ref 32.3–35.6)
MCV: 86.9 fL (ref 75.5–95.3)
MPV: 8.2 fL (ref 7.9–10.8)
PLATELETS: 209 10*3/uL (ref 140–440)
RBC: 4.82 10*6/uL (ref 3.63–4.92)
RDW: 13.7 % (ref 12.3–17.7)
WBC: 5 10*3/uL (ref 3.8–11.8)

## 2023-07-28 LAB — LIPID PANEL
CHOL/HDL RATIO: 4
CHOLESTEROL: 188 mg/dL (ref <200)
HDL CHOL: 47 mg/dL (ref >=40)
LDL CALC: 84 mg/dL (ref 0–100)
TRIGLYCERIDES: 286 mg/dL — ABNORMAL HIGH (ref <=150)
VLDL CALC: 57 mg/dL — ABNORMAL HIGH (ref 0–50)

## 2023-07-28 LAB — COMPREHENSIVE METABOLIC PNL, FASTING
ALBUMIN/GLOBULIN RATIO: 1.9 — ABNORMAL HIGH (ref 0.8–1.4)
ALBUMIN: 4.1 g/dL (ref 3.5–5.7)
ALKALINE PHOSPHATASE: 126 U/L — ABNORMAL HIGH (ref 34–104)
ALT (SGPT): 20 U/L (ref 7–52)
ANION GAP: 5 mmol/L (ref 4–13)
AST (SGOT): 18 U/L (ref 13–39)
BILIRUBIN TOTAL: 0.3 mg/dL (ref 0.3–1.0)
BUN/CREA RATIO: 21 (ref 6–22)
BUN: 18 mg/dL (ref 7–25)
CALCIUM, CORRECTED: 9.3 mg/dL (ref 8.9–10.8)
CALCIUM: 9.4 mg/dL (ref 8.6–10.3)
CHLORIDE: 106 mmol/L (ref 98–107)
CO2 TOTAL: 29 mmol/L (ref 21–31)
CREATININE: 0.84 mg/dL (ref 0.60–1.30)
ESTIMATED GFR: 75 mL/min/{1.73_m2} (ref >59)
GLOBULIN: 2.2 (ref 2.0–3.5)
GLUCOSE: 88 mg/dL (ref 74–109)
OSMOLALITY, CALCULATED: 281 mosm/kg (ref 270–290)
POTASSIUM: 4.3 mmol/L (ref 3.5–5.1)
PROTEIN TOTAL: 6.3 g/dL — ABNORMAL LOW (ref 6.4–8.9)
SODIUM: 140 mmol/L (ref 136–145)

## 2023-07-28 MED ORDER — MILNACIPRAN 100 MG TABLET
100.0000 mg | ORAL_TABLET | Freq: Two times a day (BID) | ORAL | 1 refills | Status: DC
Start: 2023-07-28 — End: 2023-08-27

## 2023-07-28 MED ORDER — BENZONATATE 200 MG CAPSULE
200.0000 mg | ORAL_CAPSULE | Freq: Every day | ORAL | 3 refills | Status: DC
Start: 2023-07-28 — End: 2023-12-09

## 2023-07-28 MED ORDER — PREDNISONE 20 MG TABLET
20.0000 mg | ORAL_TABLET | Freq: Two times a day (BID) | ORAL | 0 refills | Status: AC
Start: 2023-07-28 — End: 2023-08-02

## 2023-07-28 MED ORDER — SHINGRIX (PF) 50 MCG/0.5 ML INTRAMUSCULAR SUSPENSION, KIT
0.5000 mL | INHALATION_SUSPENSION | Freq: Once | INTRAMUSCULAR | 1 refills | Status: AC
Start: 2023-07-28 — End: 2023-07-28

## 2023-07-28 NOTE — Assessment & Plan Note (Signed)
Continue  Pravachol 20 mg one daily

## 2023-07-28 NOTE — Nursing Note (Signed)
Follow up recurrent cough pt states some better and her right ear is hurting

## 2023-07-28 NOTE — Assessment & Plan Note (Signed)
Synthroid 50 mcg  one daily    continue

## 2023-07-28 NOTE — Assessment & Plan Note (Signed)
Some better 

## 2023-07-28 NOTE — Progress Notes (Signed)
[facility]  [address]  [place] [address]  Operated by [facility]      Name: Jo Wong                       Date of Birth: 02/17/1956   MRN:  [id number]                         Date of visit: 07/28/2023     PCP: [name], PA-C     Subjective  Jo Wong is a 67 y.o. year old female who presents for Follow Up (Follow up recurrent cough pt states some better and her right ear is hurting)   to clinic.  No specialty comments available.   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 11/08/2021    Headache, post-traumatic 11/08/2021    Thyroid nodule 11/08/2021    Thrombosis of mesenteric vein (CMS HCC) 11/08/2021    Thromboembolic disorder (CMS HCC) 11/08/2021    Fibromyositis 11/08/2021    Multiple nodules of lung 11/08/2021    IBS (irritable bowel syndrome) 11/08/2021    Insomnia 11/08/2021    Acquired hypothyroidism 11/08/2021    Mixed hyperlipidemia 11/08/2021    Hypertension 11/08/2021    Major depressive disorder 11/08/2021    Prolapsed cervical intervertebral disc 11/08/2021    Cyst of ovary 11/08/2021    Chronic pain 11/08/2021    Generalized anxiety disorder 11/08/2021    Actinic keratosis 11/08/2021      Current Outpatient Medications   Medication Sig    albuterol sulfate (PROVENTIL HFA/VENTOLIN HFA) 90 mcg Inhalation Inhaler Take 2 Puffs by inhalation Every 6 hours as needed    ARIPiprazole (ABILIFY) 5 mg Oral Tablet Take 1 Tablet (5 mg total) by mouth Once a day    Benzonatate (TESSALON) 200 mg Oral Capsule Take 1 Capsule (200 mg total) by mouth Once a day    budesonide-glycopyr-formoterol (BREZTRI AEROSPHERE) 160-9-4.8 mcg/actuation Inhalation HFA Aerosol Inhaler Take 2 Puffs by inhalation Twice daily    buPROPion (WELLBUTRIN XL) 300 mg extended release 24 hr tablet Take 1 Tablet (300 mg  total) by mouth Once a day    Cholecalciferol, Vitamin D3, 50 mcg (2,000 unit) Oral Capsule Take 1 Capsule by mouth Once a day    clotrimazole (MYCELEX) 10 mg Mucous Membrane Troche DISSOLVE 1 TABLET BY MOUTH 4 TIMES DAILY (AFTER MEALS AND AT BEDTIME)    famotidine (PEPCID) 40 mg Oral Tablet Take 1 Tablet (40 mg total) by mouth Every evening    fluticasone-umeclidin-vilanter (TRELEGY ELLIPTA) 100-62.5-25 mcg Inhalation Disk with Device Take 1 Inhalation by inhalation Once a day (Patient not taking: Reported on 07/28/2023)    gabapentin (NEURONTIN) 600 mg Oral Tablet Take 1 Tablet (600 mg total) by mouth Twice daily    levothyroxine (SYNTHROID) 50 mcg Oral Tablet TAKE 1 TABLET EVERY DAY    losartan (COZAAR) 50 mg Oral Tablet TAKE 1 TABLET ONE TIME DAILY    mecobalamin, vitamin B12, 1,000 mcg Oral Tablet, Chewable Chew 1 Tablet (1,000 mcg total) Once a day    milnacipran (SAVELLA) 100 mg Oral Tablet Take 1 Tablet (100 mg total) by mouth Twice daily    montelukast (SINGULAIR) 10 mg Oral Tablet Take 1 Tablet (10 mg  total) by mouth Every evening    nitroGLYCERIN (NITROSTAT) 0.4 mg Sublingual Tablet, Sublingual Place 1 Tablet (0.4 mg total) under the tongue Every 5 minutes as needed for Chest pain for 3 doses over 15 minutes    omega-3 fatty acid (LOVAZA) 1 gram Oral Capsule Take 1 Capsule (1 g total) by mouth Once a day    OXcarbazepine (TRILEPTAL) 300 mg Oral Tablet Take 3 Tablets (900 mg total) by mouth Twice daily for 180 days Indications: facial nerve pain    pantoprazole (PROTONIX) 40 mg Oral Tablet, Delayed Release (E.C.) TAKE 1 TABLET EVERY DAY    pravastatin (PRAVACHOL) 20 mg Oral Tablet TAKE 1 TABLET EVERY EVENING    Triamcinolone Acetonide (NASACORT) 55 mcg Nasal Aerosol, Spray Administer 2 Sprays into affected nostril(s) Once a day    zolpidem (AMBIEN) 5 mg Oral Tablet 03/08/2010 Zolpidem Oral Tablet  PO 1.0 TABLET(S) QHS PRN sleep  03/08/2010  active        Chronic Disease Management-AMB  Follow-up visit  Co  right ear pain  3-4 weeks and down into throat  throbbing  all the time  no fever or chills  hx of trigeminal neuralgia       Recurrent  cough  Co still cough  no different  and says she is coughing   up  some sputum and  now  very hoarse         Will ask for ENT to  get a good visualization   due to above     Dr  Sherilyn Banker tessalon  pearls  q 8 hrs but on hs only helps some      FU MDD     Follow up Depression patient takes Wellbutrin 300 daily  still feeling down  discussed options   Abilfy  was  up to  5mg  and  down  to   2.5 mg    and  doing  well       Abilify added last visit  at   2mg   and pt  is doing some better with loss of her  brother and other things going on but  she  feels we can increase dose      Continue  Wellbutrin  as well      Seen by  Dr Sedalia Muta  now on trileptal for trigeminal neuralgia  and  work up reviewed       Pt says  she tries to say words and they dont come out  and she will discuss this  with him     One [place]  [place] and could not remember where she was going     Seen by ENT no new orders       FU Fibromyalgia   Pt is down to  one  daily  of savelle due to cost  on for months and stable  but  will be going off due to cost        Fu eosinophila  asthma sees dr Sherilyn Banker  on symbicort but still not great  will add albuterol  until she fu    meds discussed     ie xolair  or nucala  to discuss with Dr Sherilyn Banker  when she has fu      On brezetri  ( tried  trelegy but not getting  free with Pt  assistance   )        On fasenra      Follow-up  neuropathy and fibromyalgia patient is on gabapentin 600 mg twice daily along with Savella    gabapentin  level has been don  7/21  ( Failed  Cymbalta   on allergy  list  )       FU IBS- C  bm currently stable              REVIEW OF SYSTEMS:   Review of Systems  General: No fever.  No chills.  No weight changes.  HEENT: No vision changes.  Cardiac: No chest pain. No palpitations.  No dizziness.  No light-headedness.  No near syncope.  Resp: No dyspnea at  rest, no dyspnea on exertion; no cough or hemoptysis; no orthopnea or PND.  GI: No N/V. No melena.  No bright red blood per rectum.  Ext: No edema.  No claudication.  Neuro: No focal weakness.  No numbness.  All other ROS negative.      Objective:   BP 130/83 (Site: Left Arm, Patient Position: Sitting)   Pulse 91   Temp 37 C (98.6 F)   Ht 1.626 m (5\' 4" )   SpO2 96%   BMI 28.84 kg/m              PHYSICAL EXAM  Physical Exam  Gen: NAD. Alert.   Right  tmj >  left   tender  to palpation     into   neck   no parotid swelling   Eac tm clear     HEENT: PERRL; conjunctivae clear. No JVD or carotid bruit.  Cardiac: RRR with normal S1, S2.   Lungs: Clear to auscultation bilaterally. No rales. No wheezing. No rhonchi.  Abdomen: Soft, non-tender.non-distended  nl bowel sounds    Extremities: No edema. No cyanosis. No clubbing.  Neurologic:  Grossly intact    Lab Results   Component Value Date    CHOLESTEROL 194 12/15/2022    HDLCHOL 42 12/15/2022    LDLCHOL 90 12/15/2022    TRIG 310 (H) 12/15/2022       COMPLETE BLOOD COUNT   Lab Results   Component Value Date    WBC 4.8 12/15/2022    HGB 15.3 (H) 12/15/2022    HCT 45.3 (H) 12/15/2022    PLTCNT 211 12/15/2022       DIFFERENTIAL  Lab Results   Component Value Date    PMNS 69 12/15/2022    LYMPHOCYTES 21 12/15/2022    MONOCYTES 9 12/15/2022    EOSINOPHIL 1 12/15/2022    BASOPHILS 0 12/15/2022    BASOPHILS 0.00 12/15/2022    PMNABS 3.30 12/15/2022    LYMPHSABS 1.00 12/15/2022    EOSABS 0.00 12/15/2022    MONOSABS 0.40 12/15/2022        COMPREHENSIVE METABOLIC PANEL   Lab Results   Component Value Date    SODIUM 139 06/16/2023    POTASSIUM 4.1 06/16/2023    CHLORIDE 106 06/16/2023    CO2 26 06/16/2023    ANIONGAP 7 06/16/2023    BUN 16 06/16/2023    CREATININE 0.81 06/16/2023    GLUCOSENF 110 (H) 06/16/2023    CALCIUM 9.1 06/16/2023    ALB 4.3 06/17/2022    ALBUMIN 4.4 06/16/2023    TOTALPROTEIN 6.4 06/16/2023    ALKPHOS 128 (H) 06/16/2023    AST 15 06/16/2023    ALT 17  06/16/2023            THYROID STIMULATING HORMONE  Lab Results   Component Value Date  TSH 3.454 12/15/2022        Lab Results   Component Value Date    HA1C 4.9 06/17/2022     Lab Results   Component Value Date    VITD 85 12/12/2021         No orders of the defined types were placed in this encounter.     Assessment & Plan  Mixed hyperlipidemia  Continue  Pravachol 20 mg one daily      Primary hypertension  Continue  cozaar  50 mg one daily   Chronic pain syndrome  Pt is on Neurontin   600 bid    Acquired hypothyroidism  Synthroid 50 mcg  one daily    continue   OSA on CPAP  To see Pulmonology in [place]    Dislocation of temporomandibular joint, initial encounter  Exacerbated  use prednisone   20 mg one bid     Chronic cough  Some better   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    Fibromyalgia  Continue savelle         Meds reviewed as well as labs.  Chart reviewed and updated.   Continue current treatment.  Keep follow-up appointment.   Vaccine hx reviewed.   Pt has appt  in [place]  with  Pulm  due to CPAP and fittings     Prednisone 20 mg bid   x  5 days    Vaccines given  today

## 2023-07-28 NOTE — Assessment & Plan Note (Signed)
Exacerbated  use prednisone   20 mg one bid

## 2023-07-28 NOTE — Assessment & Plan Note (Signed)
Continue savelle

## 2023-07-28 NOTE — Assessment & Plan Note (Signed)
Pt is on Neurontin   600 bid

## 2023-07-28 NOTE — Assessment & Plan Note (Signed)
To see Pulmonology in [place]

## 2023-07-28 NOTE — Assessment & Plan Note (Signed)
Continue  cozaar  50 mg one daily

## 2023-08-11 ENCOUNTER — Encounter (INDEPENDENT_AMBULATORY_CARE_PROVIDER_SITE_OTHER): Payer: Self-pay | Admitting: Internal Medicine

## 2023-08-12 ENCOUNTER — Other Ambulatory Visit (INDEPENDENT_AMBULATORY_CARE_PROVIDER_SITE_OTHER): Payer: Self-pay | Admitting: Internal Medicine

## 2023-08-12 MED ORDER — GABAPENTIN 600 MG TABLET
600.0000 mg | ORAL_TABLET | Freq: Two times a day (BID) | ORAL | 1 refills | Status: DC
Start: 2023-08-12 — End: 2023-09-24

## 2023-08-17 ENCOUNTER — Encounter (INDEPENDENT_AMBULATORY_CARE_PROVIDER_SITE_OTHER): Payer: Self-pay | Admitting: Internal Medicine

## 2023-08-17 ENCOUNTER — Encounter (INDEPENDENT_AMBULATORY_CARE_PROVIDER_SITE_OTHER): Payer: Self-pay | Admitting: OTOLARYNGOLOGY

## 2023-08-24 ENCOUNTER — Encounter (INDEPENDENT_AMBULATORY_CARE_PROVIDER_SITE_OTHER): Payer: Self-pay

## 2023-08-24 ENCOUNTER — Other Ambulatory Visit (INDEPENDENT_AMBULATORY_CARE_PROVIDER_SITE_OTHER): Payer: Self-pay | Admitting: OTOLARYNGOLOGY

## 2023-08-24 DIAGNOSIS — G4733 Obstructive sleep apnea (adult) (pediatric): Secondary | ICD-10-CM

## 2023-08-27 ENCOUNTER — Other Ambulatory Visit (INDEPENDENT_AMBULATORY_CARE_PROVIDER_SITE_OTHER): Payer: Self-pay | Admitting: Internal Medicine

## 2023-08-27 ENCOUNTER — Encounter (INDEPENDENT_AMBULATORY_CARE_PROVIDER_SITE_OTHER): Payer: Self-pay | Admitting: Internal Medicine

## 2023-08-27 MED ORDER — MILNACIPRAN 100 MG TABLET
100.0000 mg | ORAL_TABLET | Freq: Two times a day (BID) | ORAL | 2 refills | Status: AC
Start: 2023-08-27 — End: ?

## 2023-09-17 ENCOUNTER — Other Ambulatory Visit (INDEPENDENT_AMBULATORY_CARE_PROVIDER_SITE_OTHER): Payer: Self-pay | Admitting: Internal Medicine

## 2023-09-17 MED ORDER — ARIPIPRAZOLE 5 MG TABLET
5.0000 mg | ORAL_TABLET | Freq: Every day | ORAL | 3 refills | Status: AC
Start: 2023-09-17 — End: ?

## 2023-09-24 ENCOUNTER — Other Ambulatory Visit: Payer: Self-pay

## 2023-09-24 ENCOUNTER — Encounter (INDEPENDENT_AMBULATORY_CARE_PROVIDER_SITE_OTHER): Payer: Self-pay | Admitting: Internal Medicine

## 2023-09-24 ENCOUNTER — Ambulatory Visit: Payer: Commercial Managed Care - PPO | Attending: Internal Medicine | Admitting: Internal Medicine

## 2023-09-24 VITALS — BP 110/82 | HR 91 | Temp 97.6°F | Ht 64.0 in | Wt 170.0 lb

## 2023-09-24 DIAGNOSIS — G4733 Obstructive sleep apnea (adult) (pediatric): Secondary | ICD-10-CM | POA: Insufficient documentation

## 2023-09-24 DIAGNOSIS — H539 Unspecified visual disturbance: Secondary | ICD-10-CM | POA: Insufficient documentation

## 2023-09-24 DIAGNOSIS — I10 Essential (primary) hypertension: Secondary | ICD-10-CM | POA: Insufficient documentation

## 2023-09-24 DIAGNOSIS — M797 Fibromyalgia: Secondary | ICD-10-CM | POA: Insufficient documentation

## 2023-09-24 DIAGNOSIS — G500 Trigeminal neuralgia: Secondary | ICD-10-CM | POA: Insufficient documentation

## 2023-09-24 DIAGNOSIS — J329 Chronic sinusitis, unspecified: Secondary | ICD-10-CM | POA: Insufficient documentation

## 2023-09-24 DIAGNOSIS — E782 Mixed hyperlipidemia: Secondary | ICD-10-CM | POA: Insufficient documentation

## 2023-09-24 DIAGNOSIS — R42 Dizziness and giddiness: Secondary | ICD-10-CM | POA: Insufficient documentation

## 2023-09-24 MED ORDER — MECLIZINE 12.5 MG TABLET
12.5000 mg | ORAL_TABLET | Freq: Two times a day (BID) | ORAL | 0 refills | Status: AC | PRN
Start: 2023-09-24 — End: ?

## 2023-09-24 MED ORDER — GABAPENTIN 800 MG TABLET
800.0000 mg | ORAL_TABLET | Freq: Two times a day (BID) | ORAL | 0 refills | Status: DC
Start: 2023-09-24 — End: 2023-11-23

## 2023-09-24 MED ORDER — AMOXICILLIN 875 MG-POTASSIUM CLAVULANATE 125 MG TABLET
1.0000 | ORAL_TABLET | Freq: Two times a day (BID) | ORAL | 0 refills | Status: AC
Start: 2023-09-24 — End: 2023-10-04

## 2023-09-24 NOTE — Assessment & Plan Note (Signed)
Seen by Dr Terrilee Croak

## 2023-09-24 NOTE — Assessment & Plan Note (Signed)
Increase Neurontin  800mg  bid   ( may help with her trigeminal neuralgia as well)

## 2023-09-24 NOTE — Nursing Note (Signed)
4 month follow up  pt states has been getting dizzy off and on and does get vision problems at the time happened on christmas states when she went to read she couldn't read it was very dizzy that day then she felt really sick and fell on her knees states then Monday she was coming from [place] she started seeing double lines got behind a car and followed them as far as she could has not had any since then.

## 2023-09-24 NOTE — Assessment & Plan Note (Signed)
High trig  on  lovaza and pravastatin   daily

## 2023-09-24 NOTE — Assessment & Plan Note (Signed)
BP stable   on cozaar  50 mg daily

## 2023-09-24 NOTE — Progress Notes (Signed)
INTERNAL MEDICINE, BUILDING A  [address]  [place] [address]  Operated by [facility]      Name: Jo Wong                       Date of Birth: 03/14/1956   MRN:  [id number]                         Date of visit: 09/24/2023     PCP: [name], PA-C     Subjective  Jo Wong is a 68 y.o. year old female who presents for Follow Up 4 Months (4 month follow up  pt states has been getting dizzy off and on and does get vision problems at the time happened on christmas states when she went to read she couldn't read it was very dizzy that day then she felt really sick and fell on her knees states then Monday she was coming from [place] she started seeing double lines got behind a car and followed them as far as she could has not had any since then.)   to clinic.  No specialty comments available.   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    Multiple nodules of lung 11/08/2021    IBS (irritable bowel syndrome) 11/08/2021    Insomnia 11/08/2021    Acquired hypothyroidism 11/08/2021    Mixed hyperlipidemia 11/08/2021    Hypertension 11/08/2021    Major depressive disorder 11/08/2021    Prolapsed cervical intervertebral disc 11/08/2021    Cyst of ovary 11/08/2021    Fibromyalgia 11/08/2021    Generalized anxiety disorder 11/08/2021    Actinic keratosis 11/08/2021      Current Outpatient Medications   Medication Sig    albuterol sulfate (PROVENTIL HFA/VENTOLIN HFA) 90 mcg Inhalation Inhaler Take 2 Puffs by  inhalation Every 6 hours as needed    ARIPiprazole (ABILIFY) 5 mg Oral Tablet Take 1 Tablet (5 mg total) by mouth Once a day    Benzonatate (TESSALON) 200 mg Oral Capsule Take 1 Capsule (200 mg total) by mouth Once a day    budesonide-glycopyr-formoterol (BREZTRI AEROSPHERE) 160-9-4.8 mcg/actuation Inhalation HFA Aerosol Inhaler Take 2 Puffs by inhalation Twice daily    buPROPion (WELLBUTRIN XL) 300 mg extended release 24 hr tablet Take 1 Tablet (300 mg total) by mouth Once a day    Cholecalciferol, Vitamin D3, 50 mcg (2,000 unit) Oral Capsule Take 1 Capsule by mouth Once a day    clotrimazole (MYCELEX) 10 mg Mucous Membrane Troche DISSOLVE 1 TABLET BY MOUTH 4 TIMES DAILY (AFTER MEALS AND AT BEDTIME)    famotidine (PEPCID) 40 mg Oral Tablet Take 1 Tablet (40 mg total) by mouth Every evening    gabapentin (NEURONTIN) 600 mg Oral Tablet Take 1 Tablet (600 mg total) by mouth Twice daily    levothyroxine (SYNTHROID) 50 mcg Oral Tablet TAKE 1 TABLET EVERY DAY    losartan (  COZAAR) 50 mg Oral Tablet TAKE 1 TABLET ONE TIME DAILY    mecobalamin, vitamin B12, 1,000 mcg Oral Tablet, Chewable Chew 1 Tablet (1,000 mcg total) Once a day    milnacipran (SAVELLA) 100 mg Oral Tablet Take 1 Tablet (100 mg total) by mouth Twice daily    montelukast (SINGULAIR) 10 mg Oral Tablet Take 1 Tablet (10 mg total) by mouth Every evening    nitroGLYCERIN (NITROSTAT) 0.4 mg Sublingual Tablet, Sublingual Place 1 Tablet (0.4 mg total) under the tongue Every 5 minutes as needed for Chest pain for 3 doses over 15 minutes    omega-3 fatty acid (LOVAZA) 1 gram Oral Capsule Take 1 Capsule (1 g total) by mouth Once a day    OXcarbazepine (TRILEPTAL) 300 mg Oral Tablet Take 3 Tablets (900 mg total) by mouth Twice daily for 180 days Indications: facial nerve pain    pantoprazole (PROTONIX) 40 mg Oral Tablet, Delayed Release (E.C.) TAKE 1 TABLET EVERY DAY    pravastatin (PRAVACHOL) 20 mg Oral Tablet TAKE 1 TABLET EVERY EVENING    Triamcinolone Acetonide  (NASACORT) 55 mcg Nasal Aerosol, Spray Administer 2 Sprays into affected nostril(s) Once a day        Follow Up 4 Months     (4 month follow up    CC pt states has been getting dizzy off and on and does get vision problems at the time happened on christmas states when she went to read she couldn't read it was very dizzy that day then she felt really sick and fell on her knees states then Monday she was coming from [place] she started seeing double lines got behind a car and followed them as far as she could has not had any since then.  Pt says if moves quick this has happened    Pt wears glasses  eye exam due      Monday  this week pt says  she got out of bed and  went to bathroom and had nose bleed  BP has been stable    Co sinus pressure   but also has  trigeminal neuralgia           Follow up   Recurrent  cough  Co still cough  no different  and says she is coughing   up  some sputum and  now  very hoarse         Will ask for ENT to  get a good visualization   due to above     Dr  Illa Level  pearls  q 8 hrs but on hs only helps some      FU MDD     Follow up Depression patient takes Wellbutrin 300 daily  still feeling down  discussed options   Abilfy  was  up to  5mg  and  down  to   2.5 mg    and  doing  well       Abilify added last visit  at   2mg   and pt  is doing some better with loss of her  brother and other things going on but  she  feels we can increase dose      Continue  Wellbutrin  as well      FU  Trigeminal neuralgia    Seen by  Dr Sedalia Muta  now on trileptal for trigeminal neuralgia  and  work up reviewed    To see Dr Sedalia Muta and will tell him that it  has been worse     Pt on  900 mg bid  trileptal    Will increase   Neurontin   800 mg bid          Seen by ENT no new orders       FU Fibromyalgia   Pt is down to  one  daily  of savelle due to cost  on for months and stable  but  will be going off due to cost        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  No chills.  No weight changes.  HEENT: No vision changes.  Cardiac: No chest pain. No palpitations.  No dizziness.  No light-headedness.  No near syncope.  Resp: No dyspnea at rest, no dyspnea on exertion; no cough or hemoptysis; no orthopnea or PND.  GI: No N/V. No melena.  No bright red blood per rectum.  Ext: No edema.  No claudication.  Neuro: No focal weakness.  No numbness.  All other ROS negative.      Objective:   BP 110/82 (Site: Left Arm, Patient Position: Sitting)   Pulse 91   Temp 36.4 C (97.6 F)   Ht 1.626 m (5\' 4" )   Wt 77.1 kg (170 lb)   SpO2 97%   BMI 29.18 kg/m              PHYSICAL EXAM  Physical Exam  Gen: NAD. Alert. Maxillary tenderness on  right   eac tm dull   no adenopathy     HEENT: PERRL; + nystagmus on right conjunctivae clear. No JVD or carotid bruit.  Cardiac: RRR with normal S1, S2.   Lungs: Clear to auscultation bilaterally. No rales. No wheezing. No rhonchi.  Abdomen: Soft, non-tender.non-distended  nl bowel sounds    Extremities: No edema. No cyanosis. No clubbing.  Neurologic:  Grossly intact    Lab Results   Component Value Date    CHOLESTEROL 188 07/28/2023    HDLCHOL 47 07/28/2023    LDLCHOL 84 07/28/2023    TRIG 286 (H) 07/28/2023       COMPLETE BLOOD COUNT   Lab Results   Component Value Date    WBC 5.0 07/28/2023    WBC 5.0 07/28/2023    HGB 14.0 07/28/2023    HCT 41.9 07/28/2023    PLTCNT 209 07/28/2023       DIFFERENTIAL  Lab Results   Component Value Date    PMNS 63  07/28/2023    LYMPHOCYTES 23 (L) 07/28/2023    MYELOCYTES 2 07/28/2023    MONOCYTES 9  07/28/2023    EOSINOPHIL 2 07/28/2023    BASOPHILS 0 12/15/2022    BASOPHILS 0.00 12/15/2022    PMNABS 3.30 12/15/2022    LYMPHSABS 1.00 12/15/2022    EOSABS 0.00 12/15/2022    MONOSABS 0.40 12/15/2022        COMPREHENSIVE METABOLIC PANEL   Lab Results   Component Value Date    SODIUM 140 07/28/2023    POTASSIUM 4.3 07/28/2023    CHLORIDE 106 07/28/2023    CO2 29 07/28/2023    ANIONGAP 5 07/28/2023    BUN 18 07/28/2023    CREATININE 0.84 07/28/2023    GLUCOSENF 88 07/28/2023    CALCIUM 9.4 07/28/2023    ALB 4.3 06/17/2022    ALBUMIN 4.1 07/28/2023    TOTALPROTEIN 6.3 (L) 07/28/2023    ALKPHOS 126 (H) 07/28/2023    AST 18 07/28/2023    ALT 20 07/28/2023            THYROID STIMULATING HORMONE  Lab Results   Component Value Date    TSH 2.590 07/28/2023        Lab Results   Component Value Date    HA1C 4.9 06/17/2022     Lab Results   Component Value Date    VITD 85 12/12/2021         No orders of the defined types were placed in this encounter.     Assessment & Plan  Primary hypertension  BP stable   on cozaar  50 mg daily     Mixed hyperlipidemia  High trig  on  lovaza and pravastatin   daily   OSA on CPAP  Seen by Dr Terrilee Croak   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  Follows with Dr Sedalia Muta    Dizziness  Antivert 12.5  tid  x  3 days if no better  will send to audiology    May need further work up    Vision changes  May be related to above    needs annual eye exam   Sinusitis, unspecified chronicity, unspecified location  Start Augmentin 875mg  bid x  10 days    Fibromyalgia  Increase Neurontin  800mg  bid   ( may help with her trigeminal neuralgia as well)    Savelle 100 mg  bid       Meds reviewed as well as labs.  Chart reviewed and updated.   Continue current treatment.  Keep follow-up appointment.   Vaccine hx reviewed.   Will change Neurontin  800 mg  bid   Will start with antivert   12.5  tid   x 3 days than  prn call if no  better   Augmentin  875mg  bid   x  10 days   Allegra   180mg  one daily x  7- 10 days   Needs eye exam   Pt has seen [facility] [employer] reviewed

## 2023-09-24 NOTE — Assessment & Plan Note (Signed)
Follows with Dr Sedalia Muta

## 2023-09-29 ENCOUNTER — Ambulatory Visit (INDEPENDENT_AMBULATORY_CARE_PROVIDER_SITE_OTHER): Payer: Self-pay | Admitting: OTOLARYNGOLOGY

## 2023-11-01 ENCOUNTER — Other Ambulatory Visit (INDEPENDENT_AMBULATORY_CARE_PROVIDER_SITE_OTHER): Payer: Self-pay | Admitting: NEUROLOGY

## 2023-11-19 ENCOUNTER — Encounter (INDEPENDENT_AMBULATORY_CARE_PROVIDER_SITE_OTHER): Payer: Self-pay | Admitting: NEUROLOGY

## 2023-11-19 ENCOUNTER — Ambulatory Visit (INDEPENDENT_AMBULATORY_CARE_PROVIDER_SITE_OTHER): Payer: Commercial Managed Care - PPO | Admitting: NEUROLOGY

## 2023-11-19 ENCOUNTER — Ambulatory Visit: Payer: Commercial Managed Care - PPO | Attending: NEUROLOGY

## 2023-11-19 ENCOUNTER — Other Ambulatory Visit: Payer: Self-pay

## 2023-11-19 VITALS — BP 133/87 | HR 96 | Temp 98.7°F | Wt 170.0 lb

## 2023-11-19 DIAGNOSIS — H532 Diplopia: Secondary | ICD-10-CM | POA: Insufficient documentation

## 2023-11-19 DIAGNOSIS — G4733 Obstructive sleep apnea (adult) (pediatric): Secondary | ICD-10-CM

## 2023-11-19 DIAGNOSIS — G500 Trigeminal neuralgia: Secondary | ICD-10-CM

## 2023-11-19 NOTE — Progress Notes (Signed)
 ASSESSMENT  TRIGEMINAL NEURALGIA:  She is a 68 year old woman who returns to clinic for 1-2 years of progressive facial pain and numbness on the right side.  Symptoms started somewhat insidiously with frank numbness before becoming painful and tender to touch.  Her exam is notable for sensory deficits in V2 on the right side. We did obtain an MRI and MRA of the head which were essentially unremarkable. Nerve conduction studies revealed decrease amplitudes of the right trigeminal nerve compared to the left. She has experienced significant response to Trileptal but has required high doses. She still has intermittent breakthrough symptoms and is also on gabapentin. This was recently increased by PCP so we'll see if this controls it. If not, will consider adding a third gabapentin dose during the day that she can use PRN.   2.   EXTREMITY PARESTHESIAS:  She has a longer standing history of paresthesias in the arms and legs.  These can be painful at times.  Her exam was previously notable for mostly small fiber deficits in a patchy distribution in the lower extremities calling into question possible small fiber neuropathy which does have an association with fibromyalgia.  EMG revealed no evidence of a large fiber process. The addition of trileptal has helped with some of this. Rheumatologic workup has been unremarkable. We will continue to monitor. She also has some pain that develops with repetitive tasks in right hand concerning for carpel tunnel syndrome. There was no electrophysiologic evidence of this, but conductions may be normal in mild cases. She can trial wrist bracing at night.   3. MEMORY CHANGES: She has previously reported decline in cognition. She does report a history of severe sleep apnea. Unfortunately she has not been able to tolerate CPAP due to the straps touching her face. I do think this is likely the etiology of her memory deficits as she has also noted increased fatigue. I do recommend she  establish with sleep medicine.   4. DIPLOPIA: This has been new in the last 3 months. It appears to have been gradual in onset. She has frank binocular diplopia concerning for cranial nerve abnormality though it is no readily apparent on exam. There appears to be no fatigability. Exam also shows some mild right lower facial weakness concerning for facial nerve involvement. We will obtain an updated MRI as well as AChR ab panel.    PLAN  1. Continue Trileptal to 900 mg BID      Continue gabapentin 800 BID (can consider TID dosing if needed)  2.  Continue to monitor       If worsening, could consider the utility of skin biopsy to confirm presence of SFN  3.   Encouraged establishing with sleep medicine  4.   Obtain MRI brain w/wo        Obtain MG ab panel    Return to clinic 8 weeks, sooner pending results    Thank you for allowing me to participate in your patient's care and please do not hesitate to contact me for any questions or concerns.    Patrick North, DO  Assistant Professor of Neurology  [facility]     I personally spent a total of 40 minutes today preparing to see the patient, in the encounter with the patient, and documenting after the visit.    [id number]: I will continue to be the provider focal point in managing the chronic complex neurological condition  ==========================================================================================================================================    NAME:  Jo Wong  DOB:  01-02-1956  VISIT DATE:  06/17/2022    CC:  Facial Numbness    Patient seen in consultation at the request of Dr. Lonia Farber  History obtained from the patient and chart/records  Age of patient:  68 y.o.    INTERVAL: Since last visit, She reports experiencing double vision. This was gradual in onset and does appear to improve with closing one eye. She states that symptoms are constant throughout and don't seem to worsen in the evening. She denies  ptosis, dysphagia. She has noticed some asymmetry in her lips which has been present for the last month. In terms of trigeminal neuralgia, her symptoms are relatively controlled though she does occasionally have significant breakthrough pain. She has not yet started an increased dose of her gabapentin. She reports a few falls which she attributes to her vision issues.     HPI:   I had the pleasure of seeing your patient in neurology clinic for an outpatient consultation, who is a 68 y.o. year old female who was referred for evaluation of facial numbness.  Please allow me to summarize the history for the record.     She states that symptoms began in November 2022.  It started with numbness that began under the right eye.  She did have a red spot in the same location at the time.  She does have a history of skin cancer and was given topical chemotherapeutic agent by her dermatologist to treat this.  Despite this, the numbness persisted and then began to extend down the right side of the face to the lip and laterally towards the ear.  Gradually it became painful and tender to touch.  She reports the even light stimulus now can cause pain on the right side of her face.  She denies any weakness.  She does report that her right eye seems to have had a very mild decline in visual acuity.  She denies any changes in taste.  She does have a previous history of headaches though nothing with similar symptoms.  She also reports more longstanding numbness and tingling in the arms and legs.  She does report a history of fibromyalgia.  She does have some lightheadedness with standing.  She does have issues with constipation and diarrhea as she has a history of IBS.    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     Headache, post-traumatic    Thyroid nodule    Thrombosis of mesenteric vein (CMS HCC)    Thromboembolic disorder (CMS HCC)    Fibromyositis    Multiple nodules of lung    IBS (irritable bowel syndrome)    Insomnia    Acquired hypothyroidism    Mixed hyperlipidemia    Hypertension    Major depressive disorder    Prolapsed cervical intervertebral disc    Cyst of ovary    Fibromyalgia    Generalized anxiety disorder    Actinic keratosis    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    OSA on CPAP    TMJ (dislocation of temporomandibular joint)    Chronic cough     Past Surgical History:   Procedure Laterality Date    BILATERAL OOPHORECTOMY      COLONOSCOPY  2017    Dr mal    HX CHOLECYSTECTOMY      HX LASER SKIN LESION  REMOVAL      01/31/22    HX THYROIDECTOMY      HX TUBAL LIGATION      SKIN GRAFT      12/2020  right upper arm    SKIN LESION EXCISION      removal of pigmented skin lesion    SQUAMOUS CELL CARCINOMA EXCISION      10/2019  left shoulder and right front shoulder 03/2021  back right         Family Medical History:       Problem Relation (Age of Onset)    Aortic Aneurysm Brother    Arthritis-rheumatoid Sister    Back Problems Brother    Blood Clots Mother    Breast Disease Sister    Cerebral Aneurysm Maternal Aunt    Deep vein thrombosis Sister    Heart Disease Mother    Hypertension (High Blood Pressure) Mother    Other Father    Primary Brain tumor Brother    Stroke Mother    Ulcers Brother            Current Outpatient Medications   Medication Sig Dispense Refill    albuterol sulfate (PROVENTIL HFA/VENTOLIN HFA) 90 mcg Inhalation Inhaler Take 2 Puffs by inhalation Every 6 hours as needed 18 g 3    ARIPiprazole (ABILIFY) 5 mg Oral Tablet Take 1 Tablet (5 mg total) by mouth Once a day 90 Tablet 3    Benzonatate (TESSALON) 200 mg Oral Capsule Take 1 Capsule (200 mg total) by mouth Once a day 60 Capsule 3    budesonide-glycopyr-formoterol (BREZTRI AEROSPHERE) 160-9-4.8 mcg/actuation Inhalation HFA Aerosol  Inhaler Take 2 Puffs by inhalation Twice daily 3 Each 2    buPROPion (WELLBUTRIN XL) 300 mg extended release 24 hr tablet Take 1 Tablet (300 mg total) by mouth Once a day 90 Tablet 2    Cholecalciferol, Vitamin D3, 50 mcg (2,000 unit) Oral Capsule Take 1 Capsule by mouth Once a day      clotrimazole (MYCELEX) 10 mg Mucous Membrane Troche DISSOLVE 1 TABLET BY MOUTH 4 TIMES DAILY (AFTER MEALS AND AT BEDTIME)      famotidine (PEPCID) 40 mg Oral Tablet Take 1 Tablet (40 mg total) by mouth Every evening 30 Tablet 5    gabapentin (NEURONTIN) 800 mg Oral Tablet Take 1 Tablet (800 mg total) by mouth Twice daily 180 Tablet 0    levothyroxine (SYNTHROID) 50 mcg Oral Tablet TAKE 1 TABLET EVERY DAY 90 Tablet 3    losartan (COZAAR) 50 mg Oral Tablet TAKE 1 TABLET ONE TIME DAILY 90 Tablet 3    meclizine (ANTIVERT) 12.5 mg Oral Tablet Take 1 Tablet (12.5 mg total) by mouth Every 12 hours as needed 30 Tablet 0    mecobalamin, vitamin B12, 1,000 mcg Oral Tablet, Chewable Chew 1 Tablet (1,000 mcg total) Once a day      milnacipran (SAVELLA) 100 mg Oral Tablet Take 1 Tablet (100 mg total) by mouth Twice daily 180 Tablet 2    montelukast (SINGULAIR) 10 mg Oral Tablet Take 1 Tablet (10 mg total) by mouth Every evening      nitroGLYCERIN (NITROSTAT) 0.4 mg Sublingual Tablet, Sublingual Place 1 Tablet (0.4 mg total) under the tongue Every 5 minutes as needed for Chest pain for 3 doses over 15 minutes      omega-3 fatty acid (LOVAZA) 1 gram Oral Capsule Take 1 Capsule (1 g total) by mouth Once a day      OXcarbazepine (TRILEPTAL) 300  mg Oral Tablet TAKE 3 TABLETS BY MOUTH TWICE DAILY FOR  FACIAL  NERVE  PAIN. 180 Tablet 0    pantoprazole (PROTONIX) 40 mg Oral Tablet, Delayed Release (E.C.) TAKE 1 TABLET EVERY DAY 90 Tablet 3    pravastatin (PRAVACHOL) 20 mg Oral Tablet TAKE 1 TABLET EVERY EVENING 90 Tablet 2    Triamcinolone Acetonide (NASACORT) 55 mcg Nasal Aerosol, Spray Administer 2 Sprays into affected nostril(s) Once a day 16.9 mL 5      No current facility-administered medications for this visit.     Allergies   Allergen Reactions    Doxycycline  Other Adverse Reaction (Add comment)     Sweats and flushing    vomiting    Duloxetine  Other Adverse Reaction (Add comment)     Unk.     Social History     Socioeconomic History    Marital status: Married     Spouse name: Not on file    Number of children: Not on file    Years of education: Not on file    Highest education level: Not on file   Occupational History    Not on file   Tobacco Use    Smoking status: Never    Smokeless tobacco: Never   Vaping Use    Vaping status: Never Used   Substance and Sexual Activity    Alcohol use: Never    Drug use: Never    Sexual activity: Not on file   Other Topics Concern    Not on file   Social History Narrative    Not on file     Social Determinants of Health     Financial Resource Strain: Low Risk  (03/04/2022)    Financial Resource Strain     SDOH Financial: No   Transportation Needs: Low Risk  (03/04/2022)    Transportation Needs     SDOH Transportation: No   Social Connections: Low Risk  (03/04/2022)    Social Connections     SDOH Social Isolation: 5 or more times a week   Intimate Partner Violence: Low Risk  (03/04/2022)    Intimate Partner Violence     SDOH Domestic Violence: No   Housing Stability: Low Risk  (03/04/2022)    Housing Stability     SDOH Housing Situation: I have housing.     SDOH Housing Worry: No       ============================================================================================================================================  GENERAL EXAMINATION  BP 133/87 (Site: Right Arm, Patient Position: Sitting, Cuff Size: Adult)   Pulse 96   Temp 37.1 C (98.7 F) (Temporal)   Wt 77.1 kg (170 lb)   SpO2 97%   BMI 29.18 kg/m   Vital signs personally reviewed  General: No acute distress, alert  HEENT: Normocephalic, no scleral icterus  Extremities: No significant edema, No cyanosis    NEUROLOGIC EXAM  On neurological exam, patient was  awake, alert and answering questions appropriately  Speech was fluent, without dysarthria or aphasia.    CN  II:  Visual fields intact to confrontation  III, IV, VI: reports frank horizontal diplopia that resolved on left gaze with possible worsening on right gaze, no fatigable ptosis  V:  Diminished over  V1, V2 distribution on the right  VII: right lower facial weakness  VIII: grossly intact  IX, X: symmetric palatal elevation  XI: normal strength of trapezius and sternocleidomastoid bilaterally  XII: tongue midline with full movements    MOTOR  Bulk: normal  Abnormal Movements: none  Strength:     MRC Grading Scale   Right Left   Deltoid 5 5   Biceps 5 5   Triceps 5 5   Wrist Extension - -   Wrist Flexion - -   Finger Extension - -   Finger Abduction - -   Finger Flexion - -   Hip Flexion 5 5   Hip Extension - -   Hip Abduction - -   Hip Adduction - -   Knee Extension 5 5   Knee Flexion 5 5   Ankle Dorsiflexion - -   Ankle Plantarflexion - -   Toe Extension - -   Toe Flexion - -             REFLEXES   Right Left   Biceps 2 2   Triceps 2 2   Brachioradialis 2 2   Patellar 2 2   Achilles 1 1   Plantar - -   Hoffman - -   Pectoralis - -   Jaw Jerk - -       SENSORY  Light touch: intact throughout all extremities    GAIT  General: casual, normal gait    ================================================================================================================================LABS  Personal Review of prior labs is notable for:   2024  CMP WNL   TSH normal  CBC largely WNL   2023  Hepatitis panel negative  Vitamin-D within normal limits   LDL 127   TSH within normal limits   CMP largely within normal limits   CBC largely within normal limits  SSA/B antibodies negative   Rheumatoid factor negative   Anti CCP antibody negative   ANA negative   SPEP within normal limits   B12 greater than 1500   A1c 4.9  IMAGING  Personal Review of imaging is notable for:    MRI Brain w/o contrast 07/17/2022:  Mild degree of atrophy  likely in keeping with age otherwise essentially unremarkable study   MRA Head July 17, 2022:  No vascular compression of the trigeminal nerve on the right  OTHER DIAGNOSTICS  Personal Review of other prior diagnostics is notable for:  Not applicable

## 2023-11-22 ENCOUNTER — Other Ambulatory Visit (INDEPENDENT_AMBULATORY_CARE_PROVIDER_SITE_OTHER): Payer: Self-pay | Admitting: OTOLARYNGOLOGY

## 2023-11-22 ENCOUNTER — Other Ambulatory Visit (INDEPENDENT_AMBULATORY_CARE_PROVIDER_SITE_OTHER): Payer: Self-pay | Admitting: Internal Medicine

## 2023-11-25 ENCOUNTER — Encounter (INDEPENDENT_AMBULATORY_CARE_PROVIDER_SITE_OTHER): Payer: Self-pay | Admitting: NEUROLOGY

## 2023-11-27 ENCOUNTER — Telehealth (INDEPENDENT_AMBULATORY_CARE_PROVIDER_SITE_OTHER): Payer: Self-pay | Admitting: NEUROLOGY

## 2023-11-27 DIAGNOSIS — H532 Diplopia: Secondary | ICD-10-CM

## 2023-11-27 LAB — MUSK ANTIBODY
INTERPRETATION: NEGATIVE
TECHNICAL RESULT: <1:10 {titer}

## 2023-11-27 LAB — MYASTHENIA GRAVIS PANEL 2 W/REFL MUSK AB
ACETYLCHOLINE REC BIND AB: <0.3 nmol/L
ACETYLCHOLINE REC BLOC AB  - EAST: <15 (ref <15)
ACETYLCHOLINE REC MOD AB: <1 %

## 2023-11-30 ENCOUNTER — Other Ambulatory Visit: Payer: Self-pay

## 2023-11-30 ENCOUNTER — Ambulatory Visit: Attending: NEUROLOGY

## 2023-11-30 ENCOUNTER — Telehealth (INDEPENDENT_AMBULATORY_CARE_PROVIDER_SITE_OTHER): Payer: Self-pay | Admitting: NEUROLOGY

## 2023-11-30 DIAGNOSIS — H532 Diplopia: Secondary | ICD-10-CM | POA: Insufficient documentation

## 2023-11-30 LAB — BUN/CREATININE RATIO
BUN/CREA RATIO: 24 — ABNORMAL HIGH (ref 6–22)
BUN: 20 mg/dL (ref 7–25)
CREATININE: 0.83 mg/dL (ref 0.60–1.30)
ESTIMATED GFR: 76 mL/min/{1.73_m2} (ref >59)

## 2023-11-30 NOTE — Telephone Encounter (Signed)
 Called patient and made her aware that Dr. Sedalia Muta put in an order for some lab work that needs to be completed before [facility] will schedule her for the MRI. Patient verbalized an understanding.

## 2023-12-08 ENCOUNTER — Encounter (HOSPITAL_BASED_OUTPATIENT_CLINIC_OR_DEPARTMENT_OTHER): Payer: Self-pay

## 2023-12-08 ENCOUNTER — Emergency Department
Admission: EM | Admit: 2023-12-08 | Discharge: 2023-12-08 | Disposition: A | Discharge status: Home / Self Care | Attending: Family | Admitting: Family

## 2023-12-08 ENCOUNTER — Other Ambulatory Visit: Payer: Self-pay

## 2023-12-08 ENCOUNTER — Emergency Department (HOSPITAL_BASED_OUTPATIENT_CLINIC_OR_DEPARTMENT_OTHER)

## 2023-12-08 DIAGNOSIS — Z1152 Encounter for screening for COVID-19: Secondary | ICD-10-CM | POA: Insufficient documentation

## 2023-12-08 DIAGNOSIS — J101 Influenza due to other identified influenza virus with other respiratory manifestations: Secondary | ICD-10-CM | POA: Insufficient documentation

## 2023-12-08 DIAGNOSIS — J40 Bronchitis, not specified as acute or chronic: Secondary | ICD-10-CM | POA: Insufficient documentation

## 2023-12-08 LAB — COVID-19, FLU A/B, RSV RAPID BY PCR
INFLUENZA VIRUS TYPE A: DETECTED — AB
INFLUENZA VIRUS TYPE B: NOT DETECTED
RESPIRATORY SYNCTIAL VIRUS (RSV): NOT DETECTED
SARS-CoV-2: NOT DETECTED

## 2023-12-08 MED ORDER — ALBUTEROL SULFATE HFA 90 MCG/ACTUATION AEROSOL INHALER
INHALATION_SPRAY | RESPIRATORY_TRACT | Status: AC
Start: 2023-12-08 — End: 2023-12-08
  Filled 2023-12-08: qty 6.7

## 2023-12-08 MED ORDER — AZITHROMYCIN 250 MG TABLET
ORAL_TABLET | ORAL | 0 refills | Status: DC
Start: 2023-12-08 — End: 2024-01-07

## 2023-12-08 MED ORDER — METHYLPREDNISOLONE 4 MG TABLETS IN A DOSE PACK
ORAL_TABLET | ORAL | 0 refills | Status: DC
Start: 2023-12-08 — End: 2024-01-07

## 2023-12-08 MED ORDER — METHYLPREDNISOLONE SOD SUCC 125 MG SOLUTION FOR INJECTION WRAPPER
INTRAVENOUS | Status: AC
Start: 2023-12-08 — End: 2023-12-08
  Filled 2023-12-08: qty 2

## 2023-12-08 MED ORDER — ALBUTEROL SULFATE HFA 90 MCG/ACTUATION AEROSOL INHALER
2.0000 | INHALATION_SPRAY | Freq: Four times a day (QID) | RESPIRATORY_TRACT | Status: DC | PRN
Start: 2023-12-08 — End: 2023-12-08
  Administered 2023-12-08: 2 via RESPIRATORY_TRACT

## 2023-12-08 MED ORDER — OSELTAMIVIR 75 MG CAPSULE
75.0000 mg | ORAL_CAPSULE | Freq: Two times a day (BID) | ORAL | 0 refills | Status: DC
Start: 2023-12-08 — End: 2024-01-07

## 2023-12-08 MED ORDER — AEROCHAMBER W/ FLOWSIGNAL SPACER
Freq: Once | Status: AC
Start: 2023-12-08 — End: 2023-12-08

## 2023-12-08 MED ORDER — IPRATROPIUM 0.5 MG-ALBUTEROL 3 MG (2.5 MG BASE)/3 ML NEBULIZATION SOLN
INHALATION_SOLUTION | RESPIRATORY_TRACT | Status: AC
Start: 2023-12-08 — End: 2023-12-08
  Filled 2023-12-08: qty 3

## 2023-12-08 MED ORDER — METHYLPREDNISOLONE SOD SUCC 125 MG SOLUTION FOR INJECTION WRAPPER
125.0000 mg | INTRAVENOUS | Status: AC
Start: 2023-12-08 — End: 2023-12-08
  Administered 2023-12-08: 125 mg via INTRAMUSCULAR

## 2023-12-08 NOTE — ED Nurses Note (Signed)
 Patient discharged home with family.  AVS reviewed with patient/care giver.  A written copy of the AVS and discharge instructions was given to the patient/care giver. Scripts escribed to preferred pharmacy. Questions sufficiently answered as needed.  Patient/care giver encouraged to follow up with PCP as indicated.  In the event of an emergency, patient/care giver instructed to call 911 or go to the nearest emergency room.

## 2023-12-08 NOTE — ED Nurses Note (Signed)
 Patient complains of sore throat, cough, congestion, bilateral ear pain, fever, body aches, and chills for 2-3 days. Denies nausea, vomiting, diarrhea or any other symptom at this time. No acute distress observed. Will continue to monitor.

## 2023-12-08 NOTE — ED Provider Notes (Signed)
 [facility], [place] - Emergency Department  ED Primary Provider Note  History of Present Illness   Chief Complaint   Patient presents with    Flu Like Symptoms     Symptoms of bilateral ear pain, nasal congestion, cough that hurts from her throat to her stomach and threw her back with yellow sputum, and chills that started 2 days ago.      Arrival: The patient arrived by Car  Jo Wong is a 68 y.o. female who had concerns including Flu Like Symptoms. Pt states cough congestion flu like sx for 2 days. Prod cough. Denies cp nor sob  Review of Systems   Constitutional: No fever, chills or weakness   Skin: No rash or diaphoresis  HENT: No headaches, + congestion  Eyes: No vision changes or photophobia   Cardio: No chest pain, palpitations or leg swelling   Respiratory: + cough, wheezing no SOB  GI:  No nausea, vomiting or stool changes  GU:  No dysuria, hematuria, or increased frequency  MSK: No muscle aches, joint or back pain  Neuro: No seizures, LOC, numbness, tingling, or focal weakness  Psychiatric: No depression, SI or substance abuse  All other systems reviewed and are negative.    History Reviewed This Encounter: all noted and reviewed.     Physical Exam   ED Triage Vitals [12/08/23 1409]   BP (Non-Invasive) 123/82   Heart Rate 96   Respiratory Rate 20   Temperature 37.4 C (99.3 F)   SpO2 95 %   Weight 76 kg (167 lb 8 oz)   Height 1.626 m (5\' 4" )       Constitutional:  68 y.o. female who appears in no distress. Normal color, no cyanosis.   HENT:   Head: Normocephalic and atraumatic.   Mouth/Throat: Oropharynx is red and moist.   Eyes: EOMI, PERRL   Neck: Trachea midline. Neck supple.  Cardiovascular: RRR, No murmurs, rubs or gallops. Intact distal pulses.  Pulmonary/Chest: BS equal bilaterally. No respiratory distress. No wheezes, rales or chest tenderness.   Abdominal: Bowel sounds present and normal. Abdomen soft, no tenderness, no rebound and no guarding.  Back: No midline  spinal tenderness, no paraspinal tenderness, no CVA tenderness.           Musculoskeletal: No edema, tenderness or deformity.  Skin: warm and dry. No rash, erythema, pallor or cyanosis  Psychiatric: normal mood and affect. Behavior is normal.   Neurological: Patient keenly alert and responsive, easily able to raise eyebrows, facial muscles/expressions symmetric, speaking in fluent sentences, moving all extremities equally and fully, normal gait    XR CHEST AP   Final Result by Edi, Radresults In (03/18 1443)   NO ACUTE FINDINGS.            Radiologist location ID: [id number]           Medical Decision Making   Diff dx of uri flu covid pneumonia bronchitis.      Flu a is pos xray neg. Speaking in full sentences no distress.     Medications Ordered/Administered in the ED   albuterol 90 mcg per inhalation oral inhaler - "Respiratory to administer" (has no administration in time range)   methylPREDNISolone sod succ (SOLU-medrol) 125 mg/2 mL injection (125 mg IntraMUSCULAR Given 12/08/23 1513)     Clinical Impression   Influenza A (Primary)   Bronchitis       Disposition: Discharged

## 2023-12-08 NOTE — ED Nurses Note (Signed)
 Patient tolerated injection without difficulty. No acute distress observed.

## 2023-12-09 ENCOUNTER — Telehealth (HOSPITAL_COMMUNITY): Payer: Self-pay | Admitting: Internal Medicine

## 2023-12-09 ENCOUNTER — Telehealth (INDEPENDENT_AMBULATORY_CARE_PROVIDER_SITE_OTHER): Payer: Self-pay | Admitting: Internal Medicine

## 2023-12-09 NOTE — Telephone Encounter (Signed)
 Post Ed Follow-Up    Post ED Follow-Up:   Document completed and/or attempted interactive contact(s) after transition to home after emergency department stay.:   Transition Facility and relevant Date:   Discharge Date: 12/08/23  Discharge from [facility] Emergency Department?: Yes  Discharge Facility: [facility]  Contacted by: March Rummage, RN  Contact method: Patient/Caregiver Telephone, MyChart Patient Portal  Contact first attempt: 12/09/2023  9:11 AM  Contact second attempt: 12/09/2023  9:11 AM  MyChart message sent?: Yes  Follow Up Visit: No answer - left message

## 2023-12-09 NOTE — Telephone Encounter (Signed)
 Post Ed Follow-Up    Post ED Follow-Up:   Document completed and/or attempted interactive contact(s) after transition to home after emergency department stay.:   Transition Facility and relevant Date:   Discharge Date: 12/08/23  Discharge from [facility] Emergency Department?: Yes  Contacted by: Royal Hawthorn method: Patient/Caregiver Telephone  Contact completed: 12/09/2023  9:32 AM  Contact first attempt: 12/09/2023  9:32 AM  MyChart message sent?: No  Was the AVS reviewed with patient?: No  Did the patient attempt to reach their PCP prior to going to the ED?: No  How is the patient recovering?: Improving  Medications prescribed: Yes  Were they obtained?: Yes  Interventions: No needs identified

## 2023-12-10 MED ORDER — OXCARBAZEPINE 300 MG TABLET
900.0000 mg | ORAL_TABLET | Freq: Two times a day (BID) | ORAL | 0 refills | Status: DC
Start: 2023-12-10 — End: 2023-12-14

## 2023-12-10 MED ORDER — OXCARBAZEPINE 300 MG TABLET
900.0000 mg | ORAL_TABLET | Freq: Two times a day (BID) | ORAL | 0 refills | Status: DC
Start: 2023-12-10 — End: 2023-12-10

## 2023-12-10 NOTE — Addendum Note (Signed)
 Addended byKathe Pallas on: 12/10/2023 09:35 AM     Modules accepted: Orders

## 2023-12-11 ENCOUNTER — Encounter (INDEPENDENT_AMBULATORY_CARE_PROVIDER_SITE_OTHER): Payer: Self-pay | Admitting: Internal Medicine

## 2023-12-11 ENCOUNTER — Other Ambulatory Visit: Payer: Self-pay

## 2023-12-11 ENCOUNTER — Ambulatory Visit: Attending: Internal Medicine | Admitting: Internal Medicine

## 2023-12-11 VITALS — HR 84 | Temp 97.5°F

## 2023-12-11 DIAGNOSIS — J111 Influenza due to unidentified influenza virus with other respiratory manifestations: Secondary | ICD-10-CM | POA: Insufficient documentation

## 2023-12-11 DIAGNOSIS — I749 Embolism and thrombosis of unspecified artery: Secondary | ICD-10-CM | POA: Insufficient documentation

## 2023-12-11 DIAGNOSIS — R531 Weakness: Secondary | ICD-10-CM | POA: Insufficient documentation

## 2023-12-11 DIAGNOSIS — H9209 Otalgia, unspecified ear: Secondary | ICD-10-CM | POA: Insufficient documentation

## 2023-12-11 DIAGNOSIS — R519 Headache, unspecified: Secondary | ICD-10-CM | POA: Insufficient documentation

## 2023-12-11 MED ORDER — LEVOFLOXACIN 500 MG TABLET
750.0000 mg | ORAL_TABLET | Freq: Every day | ORAL | 0 refills | Status: DC
Start: 2023-12-11 — End: 2024-05-30

## 2023-12-11 MED ORDER — METHYLPREDNISOLONE ACETATE 80 MG/ML SUSPENSION FOR INJECTION
80.0000 mg | INTRAMUSCULAR | Status: AC
Start: 2023-12-11 — End: 2023-12-14
  Administered 2023-12-14: 80 mg via INTRAMUSCULAR

## 2023-12-11 MED ORDER — PROMETHAZINE-DM 6.25 MG-15 MG/5 ML ORAL SYRUP
5.0000 mL | ORAL_SOLUTION | Freq: Four times a day (QID) | ORAL | 1 refills | Status: DC | PRN
Start: 2023-12-11 — End: 2024-01-07

## 2023-12-11 NOTE — Progress Notes (Signed)
 [facility]  [address]  [place] [address]  Operated by [facility]  History and Physical    Name: Jo Wong MRN:  [id number]   Date: 12/11/2023 DOB:  1956-01-23 (68 y.o.)         Name: Jo Wong                       Date of Birth: [birth year]   MRN:  [id number]                         Date of visit: 12/11/2023     PCP: Tia Flowers, PA-C     Subjective  Jo Wong is a 68 y.o. year old female who presents for ED Follow-up (Psotive for flu a  )   to clinic.  No specialty comments available.   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    Multiple nodules of lung 11/08/2021    IBS (irritable bowel syndrome) 11/08/2021    Insomnia 11/08/2021    Acquired hypothyroidism 11/08/2021    Mixed hyperlipidemia 11/08/2021    Hypertension 11/08/2021    Major depressive disorder 11/08/2021    Prolapsed cervical intervertebral disc 11/08/2021    Cyst of ovary 11/08/2021    Fibromyalgia 11/08/2021    Generalized anxiety disorder 11/08/2021    Actinic keratosis 11/08/2021      Current Outpatient Medications   Medication Sig    albuterol  sulfate (PROVENTIL  HFA/VENTOLIN  HFA) 90 mcg Inhalation Inhaler Take 2 Puffs by inhalation Every 6 hours as needed    ARIPiprazole  (ABILIFY ) 5 mg Oral Tablet Take 1 Tablet (5 mg total) by mouth Once a day    azithromycin  (ZITHROMAX ) 250 mg Oral Tablet Take 500 mg (2 tab) on day 1; take 250 mg (1 tab) on days 2-5.    budesonide-glycopyr-formoterol (BREZTRI  AEROSPHERE)  160-9-4.8 mcg/actuation Inhalation HFA Aerosol Inhaler Take 2 Puffs by inhalation Twice daily    buPROPion  (WELLBUTRIN  XL) 300 mg extended release 24 hr tablet Take 1 Tablet (300 mg total) by mouth Once a day    Cholecalciferol, Vitamin D3, 50 mcg (2,000 unit) Oral Capsule Take 1 Capsule by mouth Once a day    clotrimazole (MYCELEX) 10 mg Mucous Membrane Troche DISSOLVE 1 TABLET BY MOUTH 4 TIMES DAILY (AFTER MEALS AND AT BEDTIME)    famotidine  (PEPCID ) 40 mg Oral Tablet TAKE 1 TABLET EVERY EVENING    gabapentin  (NEURONTIN ) 800 mg Oral Tablet TAKE 1 TABLET TWICE DAILY    levoFLOXacin  (LEVAQUIN ) 500 mg Oral Tablet Take 1.5 Tablets (750 mg total) by mouth Daily    levothyroxine  (SYNTHROID) 50 mcg Oral Tablet TAKE 1 TABLET EVERY DAY    losartan  (COZAAR ) 50 mg Oral Tablet TAKE 1 TABLET ONE TIME DAILY    meclizine  (ANTIVERT ) 12.5 mg Oral Tablet Take 1 Tablet (12.5 mg total) by mouth Every 12 hours as needed    mecobalamin, vitamin  B12, 1,000 mcg Oral Tablet, Chewable Chew 1 Tablet (1,000 mcg total) Once a day    Methylprednisolone  (MEDROL  DOSEPACK) 4 mg Oral Tablets, Dose Pack Take as instructed.    milnacipran  (SAVELLA ) 100 mg Oral Tablet Take 1 Tablet (100 mg total) by mouth Twice daily    montelukast (SINGULAIR) 10 mg Oral Tablet Take 1 Tablet (10 mg total) by mouth Every evening    nitroGLYCERIN (NITROSTAT) 0.4 mg Sublingual Tablet, Sublingual Place 1 Tablet (0.4 mg total) under the tongue Every 5 minutes as needed for Chest pain for 3 doses over 15 minutes    omega-3 fatty acid (LOVAZA) 1 gram Oral Capsule Take 1 Capsule (1 g total) by mouth Once a day    oseltamivir  (TAMIFLU ) 75 mg Oral Capsule Take 1 Capsule (75 mg total) by mouth Twice daily    OXcarbazepine  (TRILEPTAL ) 300 mg Oral Tablet Take 3 Tablets (900 mg total) by mouth Twice daily for 30 days    pantoprazole  (PROTONIX ) 40 mg Oral Tablet, Delayed Release (E.C.) TAKE 1 TABLET EVERY DAY    pravastatin  (PRAVACHOL ) 20 mg Oral Tablet TAKE 1 TABLET EVERY EVENING     promethazine -dextromethorphan (PHENERGAN -DM) 6.25-15 mg/5 mL Oral Syrup Take 5 mL by mouth Four times a day as needed for Cough    Triamcinolone  Acetonide (NASACORT ) 55 mcg Nasal Aerosol, Spray Administer 2 Sprays into affected nostril(s) Once a day            ER FU   This 68 yo  is here for fu Influenza   pt is on Tamiflu  Z-Pak and prednisone    Pt says  1 week  and  cough congestion  still there  No fever chills  no nvd   still +  ear aches and  headache    Cough productive     Still not feeling well and is here for  routine  er        Post Ed Follow-Up   Post ED Follow-Up:   Document completed and/or attempted interactive contact(s) after transition to home after emergency department stay.:   Transition Facility and relevant Date:   Discharge Date: 12/08/23  Discharge from [facility] Emergency Department?: Yes  Contacted by: Albina Hull method: Patient/Caregiver Telephone  Contact completed: 12/09/2023  9:32 AM  Contact first attempt: 12/09/2023  9:32 AM  MyChart message sent?: No  Was the AVS reviewed with patient?: No  Did the patient attempt to reach their PCP prior to going to the ED?: No  How is the patient recovering?: Improving  Medications prescribed: Yes  Were they obtained?: Yes  Interventions: No needs identified               REVIEW OF SYSTEMS:   Review of Systems  Review of Systems have been reviewed    Objective: Pulse 84   Temp 36.4 C (97.5 F)   SpO2 96%                PHYSICAL EXAM  Physical Exam  Gen: NAD. Alert. Mouth throat  clear   Eac  tm dull bilateral   Lungs  Rhonchi anterior posterior chest  clears mostly with cough   Heart RRR wo mg        Orders Placed This Encounter    levoFLOXacin  (LEVAQUIN ) 500 mg Oral Tablet    promethazine -dextromethorphan (PHENERGAN -DM) 6.25-15 mg/5 mL Oral Syrup    methylPREDNISolone  acetate (DEPO-medrol ) 80 mg/mL injection      Assessment & Plan  Influenza  Weakness    Otalgia, unspecified laterality    Headache    Thromboembolic disorder (CMS HCC)          Meds reviewed as well as labs.  Chart reviewed and updated.   Continue current treatment.  Keep follow-up appointment.   Vaccine hx reviewed.    ER  records reviewed   Add Levaquin   to  current Z-Pak   Promethazine   prn   Depo 80 IM     Increase in hydration

## 2023-12-12 ENCOUNTER — Encounter (INDEPENDENT_AMBULATORY_CARE_PROVIDER_SITE_OTHER): Payer: Self-pay | Admitting: NEUROLOGY

## 2023-12-14 MED ORDER — OXCARBAZEPINE 300 MG TABLET: 900.0000 mg | ORAL_TABLET | Freq: Two times a day (BID) | ORAL | 1 refills | Status: DC

## 2024-01-07 ENCOUNTER — Other Ambulatory Visit (INDEPENDENT_AMBULATORY_CARE_PROVIDER_SITE_OTHER): Payer: Self-pay | Admitting: Internal Medicine

## 2024-01-07 ENCOUNTER — Encounter (INDEPENDENT_AMBULATORY_CARE_PROVIDER_SITE_OTHER): Payer: Self-pay | Admitting: NEUROLOGY

## 2024-01-07 ENCOUNTER — Other Ambulatory Visit: Payer: Self-pay

## 2024-01-07 ENCOUNTER — Ambulatory Visit (INDEPENDENT_AMBULATORY_CARE_PROVIDER_SITE_OTHER): Payer: Self-pay | Admitting: NEUROLOGY

## 2024-01-07 VITALS — BP 141/82 | HR 85 | Temp 98.0°F | Wt 167.6 lb

## 2024-01-07 DIAGNOSIS — H532 Diplopia: Secondary | ICD-10-CM

## 2024-01-07 DIAGNOSIS — G4733 Obstructive sleep apnea (adult) (pediatric): Secondary | ICD-10-CM

## 2024-01-07 MED ORDER — BREZTRI AEROSPHERE 160 MCG-9MCG-4.8MCG/ACTUATION HFA AEROSOL INHALER
2.0000 | INHALATION_SPRAY | Freq: Two times a day (BID) | RESPIRATORY_TRACT | 2 refills | Status: DC
Start: 2024-01-07 — End: 2024-07-19

## 2024-01-07 NOTE — Progress Notes (Signed)
 ASSESSMENT  TRIGEMINAL NEURALGIA:  She is a 68 year old woman who returns to clinic for 1-2 years of progressive facial pain and numbness on the right side.  Symptoms started somewhat insidiously with frank numbness before becoming painful and tender to touch.  Her exam is notable for sensory deficits in V2 on the right side. We did obtain an MRI and MRA of the head which were essentially unremarkable. Nerve conduction studies revealed decrease amplitudes of the right trigeminal nerve compared to the left. She has experienced significant response to Trileptal but has required high doses. She still has intermittent breakthrough symptoms and is also on gabapentin. This again has been helpful but incomplete. We will increase now to TID dosing and see how she tolerates. We may consider referral to NSGY pending course vs trial of lacosamide in place of Trileptal.   2.   EXTREMITY PARESTHESIAS:  She has a longer standing history of paresthesias in the arms and legs.  These can be painful at times.  Her exam was previously notable for mostly small fiber deficits in a patchy distribution in the lower extremities calling into question possible small fiber neuropathy which does have an association with fibromyalgia.  EMG revealed no evidence of a large fiber process. The addition of trileptal and gabapentin has helped her symptoms. Rheumatologic workup has been unremarkable.   3. MEMORY CHANGES: She has previously reported decline in cognition though this has been stable over multiple visits. She does report a history of severe sleep apnea. Unfortunately she has not been able to tolerate CPAP due to the straps touching her face. I do think this is likely the etiology of her memory deficits as she has also noted increased fatigue. I have placed referral to sleep medicine.  4. DIPLOPIA: This has been new in the last 3 months. It appears to have been gradual in onset. She has frank binocular diplopia concerning for cranial nerve  abnormality though it is not readily apparent on exam. There appears to be no fatigability. Exam also shows some mild right lower facial weakness concerning for facial nerve involvement. Updated MRI and MG antibody panel was negative. She has a history of mesenteric vein thrombosis so we will obtain MRV to eval for cavernous sinus thrombosis.     PLAN  1. Continue Trileptal to 900 mg BID      Increase gabapentin to 800 TID   2.  Continue to monitor       If worsening, could consider the utility of skin biopsy to confirm presence of SFN  3.   Referral to sleep medicine  4.   Obtain MRV    Return to clinic 10 weeks, sooner pending results    Thank you for allowing me to participate in your patient's care and please do not hesitate to contact me for any questions or concerns.    Patrick North, DO  Assistant Professor of Neurology  [facility]     I personally spent a total of 30 minutes today preparing to see the patient, in the encounter with the patient, and documenting after the visit.    [id number]: I will continue to be the provider focal point in managing the chronic complex neurological condition  ==========================================================================================================================================    NAME:  Jo Wong  DOB:  [birth year]  VISIT DATE:  06/17/2022    CC:  Facial Numbness    Patient seen in consultation at the request of Dr. Lonia Farber  History obtained from  the patient and chart/records  Age of patient:  68 y.o.    INTERVAL: Since last visit, She continues to report binocular double vision. It is present throughout the day. Has not worsened since last visit. Her TN symptoms are still significant. Memory is unchanged. Other paresthesias are well controlled.    HPI:   I had the pleasure of seeing your patient in neurology clinic for an outpatient consultation, who is a 68 y.o. year old female who was referred for evaluation of  facial numbness.  Please allow me to summarize the history for the record.     She states that symptoms began in November 2022.  It started with numbness that began under the right eye.  She did have a red spot in the same location at the time.  She does have a history of skin cancer and was given topical chemotherapeutic agent by her dermatologist to treat this.  Despite this, the numbness persisted and then began to extend down the right side of the face to the lip and laterally towards the ear.  Gradually it became painful and tender to touch.  She reports the even light stimulus now can cause pain on the right side of her face.  She denies any weakness.  She does report that her right eye seems to have had a very mild decline in visual acuity.  She denies any changes in taste.  She does have a previous history of headaches though nothing with similar symptoms.  She also reports more longstanding numbness and tingling in the arms and legs.  She does report a history of fibromyalgia.  She does have some lightheadedness with standing.  She does have issues with constipation and diarrhea as she has a history of IBS.    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    Headache, post-traumatic    Thyroid nodule    Thrombosis of mesenteric vein (CMS HCC)    Thromboembolic disorder (CMS HCC)    Fibromyositis    Multiple nodules of lung    IBS (irritable bowel syndrome)    Insomnia    Acquired hypothyroidism    Mixed hyperlipidemia    Hypertension    Major depressive disorder    Prolapsed cervical intervertebral disc    Cyst of ovary    Fibromyalgia    Generalized anxiety disorder    Actinic keratosis    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    OSA on CPAP    TMJ (dislocation of temporomandibular joint)     Chronic cough     Past Surgical History:   Procedure Laterality Date    BILATERAL OOPHORECTOMY      COLONOSCOPY  2017    Dr mal    HX CHOLECYSTECTOMY      HX LASER SKIN LESION REMOVAL      01/31/22    HX THYROIDECTOMY      HX TUBAL LIGATION      SKIN GRAFT      12/2020  right upper arm    SKIN LESION EXCISION      removal of pigmented skin lesion    SQUAMOUS CELL CARCINOMA EXCISION      10/2019  left shoulder and right front shoulder 03/2021  back right         Family Medical History:       Problem Relation (Age of Onset)  Aortic Aneurysm Brother    Arthritis-rheumatoid Sister    Back Problems Brother    Blood Clots Mother    Breast Disease Sister    Cerebral Aneurysm Maternal Aunt    Deep vein thrombosis Sister    Heart Disease Mother    Hypertension (High Blood Pressure) Mother    Other Father    Primary Brain tumor Brother    Stroke Mother    Ulcers Brother            Current Outpatient Medications   Medication Sig Dispense Refill    albuterol sulfate (PROVENTIL HFA/VENTOLIN HFA) 90 mcg Inhalation Inhaler Take 2 Puffs by inhalation Every 6 hours as needed 18 g 3    ARIPiprazole (ABILIFY) 5 mg Oral Tablet Take 1 Tablet (5 mg total) by mouth Once a day 90 Tablet 3    budesonide-glycopyr-formoterol (BREZTRI AEROSPHERE) 160-9-4.8 mcg/actuation Inhalation HFA Aerosol Inhaler Take 2 Puffs by inhalation Twice daily 3 Each 2    buPROPion (WELLBUTRIN XL) 300 mg extended release 24 hr tablet Take 1 Tablet (300 mg total) by mouth Once a day 90 Tablet 2    Cholecalciferol, Vitamin D3, 50 mcg (2,000 unit) Oral Capsule Take 1 Capsule by mouth Once a day      clotrimazole (MYCELEX) 10 mg Mucous Membrane Troche DISSOLVE 1 TABLET BY MOUTH 4 TIMES DAILY (AFTER MEALS AND AT BEDTIME)      famotidine (PEPCID) 40 mg Oral Tablet TAKE 1 TABLET EVERY EVENING 90 Tablet 3    gabapentin (NEURONTIN) 800 mg Oral Tablet TAKE 1 TABLET TWICE DAILY 180 Tablet 1    levoFLOXacin (LEVAQUIN) 500 mg Oral Tablet Take 1.5 Tablets (750 mg total) by  mouth Daily 10 Tablet 0    levothyroxine (SYNTHROID) 50 mcg Oral Tablet TAKE 1 TABLET EVERY DAY 90 Tablet 3    losartan (COZAAR) 50 mg Oral Tablet TAKE 1 TABLET ONE TIME DAILY 90 Tablet 3    meclizine (ANTIVERT) 12.5 mg Oral Tablet Take 1 Tablet (12.5 mg total) by mouth Every 12 hours as needed 30 Tablet 0    mecobalamin, vitamin B12, 1,000 mcg Oral Tablet, Chewable Chew 1 Tablet (1,000 mcg total) Once a day      milnacipran (SAVELLA) 100 mg Oral Tablet Take 1 Tablet (100 mg total) by mouth Twice daily 180 Tablet 2    montelukast (SINGULAIR) 10 mg Oral Tablet Take 1 Tablet (10 mg total) by mouth Every evening      nitroGLYCERIN (NITROSTAT) 0.4 mg Sublingual Tablet, Sublingual Place 1 Tablet (0.4 mg total) under the tongue Every 5 minutes as needed for Chest pain for 3 doses over 15 minutes      omega-3 fatty acid (LOVAZA) 1 gram Oral Capsule Take 1 Capsule (1 g total) by mouth Daily      OXcarbazepine (TRILEPTAL) 300 mg Oral Tablet Take 3 Tablets (900 mg total) by mouth Twice daily for 180 days 540 Tablet 1    pantoprazole (PROTONIX) 40 mg Oral Tablet, Delayed Release (E.C.) TAKE 1 TABLET EVERY DAY 90 Tablet 3    pravastatin (PRAVACHOL) 20 mg Oral Tablet TAKE 1 TABLET EVERY EVENING 90 Tablet 2    Triamcinolone Acetonide (NASACORT) 55 mcg Nasal Aerosol, Spray Administer 2 Sprays into affected nostril(s) Once a day 16.9 mL 5     No current facility-administered medications for this visit.     Allergies   Allergen Reactions    Doxycycline  Other Adverse Reaction (Add comment)     Sweats and flushing  vomiting    Duloxetine  Other Adverse Reaction (Add comment)     Unk.     Social History     Socioeconomic History    Marital status: Married     Spouse name: Not on file    Number of children: Not on file    Years of education: Not on file    Highest education level: Not on file   Occupational History    Not on file   Tobacco Use    Smoking status: Never    Smokeless tobacco: Never   Vaping Use    Vaping status: Never  Used   Substance and Sexual Activity    Alcohol use: Never    Drug use: Never    Sexual activity: Not on file   Other Topics Concern    Not on file   Social History Narrative    Not on file     Social Determinants of Health     Financial Resource Strain: Low Risk  (03/04/2022)    Financial Resource Strain     SDOH Financial: No   Transportation Needs: Low Risk  (03/04/2022)    Transportation Needs     SDOH Transportation: No   Social Connections: Low Risk  (03/04/2022)    Social Connections     SDOH Social Isolation: 5 or more times a week   Intimate Partner Violence: Low Risk  (03/04/2022)    Intimate Partner Violence     SDOH Domestic Violence: No   Housing Stability: Low Risk  (03/04/2022)    Housing Stability     SDOH Housing Situation: I have housing.     SDOH Housing Worry: No       ============================================================================================================================================  GENERAL EXAMINATION  BP (!) 141/82 (Site: Left Arm, Patient Position: Sitting)   Pulse 85   Temp 36.7 C (98 F) (Temporal)   Wt 76 kg (167 lb 9.6 oz)   SpO2 97%   BMI 28.77 kg/m   Vital signs personally reviewed  General: No acute distress, alert  HEENT: Normocephalic, no scleral icterus  Extremities: No significant edema, No cyanosis    NEUROLOGIC EXAM  On neurological exam, patient was awake, alert and answering questions appropriately  Speech was fluent, without dysarthria or aphasia.    CN  II:  Visual fields intact to confrontation  III, IV, VI: reports frank horizontal diplopia with left inferior gaze though also present with sustained upgaze, no ptosis  V:  Deferred given pain  VII: right lower facial weakness  VIII: grossly intact  IX, X: symmetric palatal elevation  XI: normal strength of trapezius and sternocleidomastoid bilaterally  XII: tongue midline with full movements    MOTOR  Bulk: normal  Abnormal Movements: none    Strength:     MRC Grading Scale   Right Left   Deltoid 5  5   Biceps 5 5   Triceps 5 5   Wrist Extension - -   Wrist Flexion - -   Finger Extension - -   Finger Abduction - -   Finger Flexion - -   Hip Flexion 5 5   Hip Extension - -   Hip Abduction - -   Hip Adduction - -   Knee Extension - -   Knee Flexion - -   Ankle Dorsiflexion - -   Ankle Plantarflexion - -   Toe Extension - -   Toe Flexion - -  REFLEXES   Right Left   Biceps 2 2   Triceps 2 2   Brachioradialis 2 2   Patellar 2 2   Achilles 1 1   Plantar - -   Hoffman - -   Pectoralis - -   Jaw Jerk - -       SENSORY  Light touch: intact throughout all extremities    GAIT  General: casual, normal gait    ================================================================================================================================LABS  Personal Review of prior labs is notable for:   2024  CMP WNL   TSH normal  CBC largely WNL   2023  Hepatitis panel negative  Vitamin-D within normal limits   LDL 127   TSH within normal limits   CMP largely within normal limits   CBC largely within normal limits  SSA/B antibodies negative   Rheumatoid factor negative   Anti CCP antibody negative   ANA negative   SPEP within normal limits   B12 greater than 1500   A1c 4.9  IMAGING  Personal Review of imaging is notable for:    MRI Brain w/o contrast 07/17/2022:  Mild degree of atrophy likely in keeping with age otherwise essentially unremarkable study   MRA Head July 17, 2022:  No vascular compression of the trigeminal nerve on the right  OTHER DIAGNOSTICS  Personal Review of other prior diagnostics is notable for:  Not applicable

## 2024-01-12 HISTORY — PX: HX TUMOR REMOVAL: SHX12

## 2024-01-25 ENCOUNTER — Ambulatory Visit (INDEPENDENT_AMBULATORY_CARE_PROVIDER_SITE_OTHER): Payer: Self-pay | Admitting: Internal Medicine

## 2024-01-25 ENCOUNTER — Ambulatory Visit: Payer: Commercial Managed Care - PPO | Attending: Internal Medicine | Admitting: Internal Medicine

## 2024-01-25 ENCOUNTER — Other Ambulatory Visit: Payer: Self-pay

## 2024-01-25 ENCOUNTER — Encounter (INDEPENDENT_AMBULATORY_CARE_PROVIDER_SITE_OTHER): Payer: Self-pay | Admitting: Internal Medicine

## 2024-01-25 VITALS — BP 138/82 | HR 87 | Temp 97.3°F | Ht 64.0 in | Wt 169.0 lb

## 2024-01-25 DIAGNOSIS — G894 Chronic pain syndrome: Secondary | ICD-10-CM | POA: Insufficient documentation

## 2024-01-25 DIAGNOSIS — R42 Dizziness and giddiness: Secondary | ICD-10-CM

## 2024-01-25 DIAGNOSIS — E039 Hypothyroidism, unspecified: Secondary | ICD-10-CM | POA: Insufficient documentation

## 2024-01-25 DIAGNOSIS — I10 Essential (primary) hypertension: Secondary | ICD-10-CM | POA: Insufficient documentation

## 2024-01-25 DIAGNOSIS — R058 Other specified cough: Secondary | ICD-10-CM

## 2024-01-25 DIAGNOSIS — J8283 Eosinophilic asthma: Secondary | ICD-10-CM | POA: Insufficient documentation

## 2024-01-25 DIAGNOSIS — G500 Trigeminal neuralgia: Secondary | ICD-10-CM

## 2024-01-25 DIAGNOSIS — K581 Irritable bowel syndrome with constipation: Secondary | ICD-10-CM

## 2024-01-25 DIAGNOSIS — F329 Major depressive disorder, single episode, unspecified: Secondary | ICD-10-CM

## 2024-01-25 DIAGNOSIS — R490 Dysphonia: Secondary | ICD-10-CM

## 2024-01-25 DIAGNOSIS — E782 Mixed hyperlipidemia: Secondary | ICD-10-CM | POA: Insufficient documentation

## 2024-01-25 DIAGNOSIS — M797 Fibromyalgia: Secondary | ICD-10-CM | POA: Insufficient documentation

## 2024-01-25 DIAGNOSIS — Z872 Personal history of diseases of the skin and subcutaneous tissue: Secondary | ICD-10-CM

## 2024-01-25 DIAGNOSIS — G629 Polyneuropathy, unspecified: Secondary | ICD-10-CM

## 2024-01-25 LAB — COMPREHENSIVE METABOLIC PNL, FASTING
ALBUMIN/GLOBULIN RATIO: 1.9 — ABNORMAL HIGH (ref 0.8–1.4)
ALBUMIN: 4.6 g/dL (ref 3.5–5.7)
ALKALINE PHOSPHATASE: 116 U/L — ABNORMAL HIGH (ref 34–104)
ALT (SGPT): 13 U/L (ref 7–52)
ANION GAP: 5 mmol/L (ref 4–13)
AST (SGOT): 15 U/L (ref 13–39)
BILIRUBIN TOTAL: 0.3 mg/dL (ref 0.3–1.0)
BUN/CREA RATIO: 24 — ABNORMAL HIGH (ref 6–22)
BUN: 18 mg/dL (ref 7–25)
CALCIUM, CORRECTED: 9.1 mg/dL (ref 8.9–10.8)
CALCIUM: 9.6 mg/dL (ref 8.6–10.3)
CHLORIDE: 103 mmol/L (ref 98–107)
CO2 TOTAL: 29 mmol/L (ref 21–31)
CREATININE: 0.74 mg/dL (ref 0.60–1.30)
ESTIMATED GFR: 88 mL/min/{1.73_m2} (ref >59)
GLOBULIN: 2.4 (ref 2.0–3.5)
GLUCOSE: 99 mg/dL (ref 74–109)
OSMOLALITY, CALCULATED: 276 mosm/kg (ref 270–290)
POTASSIUM: 4.8 mmol/L (ref 3.5–5.1)
PROTEIN TOTAL: 7 g/dL (ref 6.4–8.9)
SODIUM: 137 mmol/L (ref 136–145)

## 2024-01-25 LAB — CBC WITH DIFF
BASOPHIL #: 0 10*3/uL (ref 0.00–0.10)
BASOPHIL %: 1 % (ref 0–1)
EOSINOPHIL #: 0 10*3/uL (ref 0.00–0.50)
EOSINOPHIL %: 0 % — ABNORMAL LOW (ref 1–7)
HCT: 43.2 % — ABNORMAL HIGH (ref 31.2–41.9)
HGB: 14.8 g/dL — ABNORMAL HIGH (ref 10.9–14.3)
LYMPHOCYTE #: 1 10*3/uL — ABNORMAL LOW (ref 1.10–3.10)
LYMPHOCYTE %: 21 % (ref 16–46)
MCH: 28.9 pg (ref 24.7–32.8)
MCHC: 34.3 g/dL (ref 32.3–35.6)
MCV: 84.1 fL (ref 75.5–95.3)
MONOCYTE #: 0.4 10*3/uL (ref 0.20–0.90)
MONOCYTE %: 8 % (ref 4–11)
MPV: 8 fL (ref 7.9–10.8)
NEUTROPHIL #: 3.3 10*3/uL (ref 1.90–8.20)
NEUTROPHIL %: 70 % (ref 43–77)
PLATELETS: 241 10*3/uL (ref 140–440)
RBC: 5.14 10*6/uL — ABNORMAL HIGH (ref 3.63–4.92)
RDW: 13.8 % (ref 12.3–17.7)
WBC: 4.7 10*3/uL (ref 3.8–11.8)

## 2024-01-25 LAB — LIPID PANEL
CHOL/HDL RATIO: 3.3
CHOLESTEROL: 200 mg/dL — ABNORMAL HIGH (ref <200)
HDL CHOL: 61 mg/dL (ref >=40)
LDL CALC: 106 mg/dL — ABNORMAL HIGH (ref 0–100)
TRIGLYCERIDES: 163 mg/dL — ABNORMAL HIGH (ref <=150)
VLDL CALC: 33 mg/dL (ref 0–50)

## 2024-01-25 LAB — THYROID STIMULATING HORMONE (SENSITIVE TSH): TSH: 2.433 u[IU]/mL (ref 0.450–5.330)

## 2024-01-25 NOTE — Progress Notes (Signed)
 [facility]  [address]  [place] [address]  Operated by [facility]      Name: Jo Wong                       Date of Birth: 05-12-1956   MRN:  [id number]                         Date of visit: 01/25/2024     PCP: Tia Flowers, PA-C     Subjective  Jo Wong is a 68 y.o. year old female who presents for Follow Up 4 Months (4 month follow up pt states she had a tumor removed 02/11/24 from her throat by dr [place] johnson)   to clinic.  No specialty comments available.   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    Multiple nodules of lung 11/08/2021    IBS (irritable bowel syndrome) 11/08/2021    Insomnia 11/08/2021    Acquired hypothyroidism 11/08/2021    Mixed hyperlipidemia 11/08/2021    Hypertension 11/08/2021    Major depressive disorder 11/08/2021    Prolapsed cervical intervertebral disc 11/08/2021    Cyst of ovary 11/08/2021    Fibromyalgia 11/08/2021    Generalized anxiety disorder 11/08/2021    Actinic keratosis 11/08/2021      Current Outpatient Medications   Medication Sig    albuterol  sulfate (PROVENTIL  HFA/VENTOLIN  HFA) 90 mcg Inhalation Inhaler Take 2 Puffs by inhalation Every 6 hours as needed    ARIPiprazole  (ABILIFY ) 5 mg Oral Tablet Take 1 Tablet (5 mg total) by mouth Once a day    budesonide-glycopyr-formoterol (BREZTRI  AEROSPHERE) 160-9-4.8 mcg/actuation Inhalation HFA Aerosol Inhaler Take 2 Puffs by inhalation Twice daily    buPROPion  (WELLBUTRIN  XL) 300 mg extended  release 24 hr tablet Take 1 Tablet (300 mg total) by mouth Once a day    Cholecalciferol, Vitamin D3, 50 mcg (2,000 unit) Oral Capsule Take 1 Capsule by mouth Once a day    clotrimazole (MYCELEX) 10 mg Mucous Membrane Troche DISSOLVE 1 TABLET BY MOUTH 4 TIMES DAILY (AFTER MEALS AND AT BEDTIME)    famotidine  (PEPCID ) 40 mg Oral Tablet TAKE 1 TABLET EVERY EVENING    gabapentin  (NEURONTIN ) 800 mg Oral Tablet TAKE 1 TABLET TWICE DAILY (Patient taking differently: Take 1 Tablet (800 mg total) by mouth Three times a day)    levoFLOXacin  (LEVAQUIN ) 500 mg Oral Tablet Take 1.5 Tablets (750 mg total) by mouth Daily (Patient not taking: Reported on 01/25/2024)    levothyroxine  (SYNTHROID) 50 mcg Oral Tablet TAKE 1 TABLET EVERY DAY    losartan  (COZAAR ) 50 mg Oral Tablet TAKE 1 TABLET ONE TIME DAILY    meclizine  (ANTIVERT ) 12.5 mg Oral Tablet Take 1 Tablet (12.5 mg total) by mouth Every 12 hours as needed    mecobalamin, vitamin B12, 1,000 mcg Oral Tablet, Chewable Chew 1 Tablet (1,000 mcg total) Once a day  milnacipran  (SAVELLA ) 100 mg Oral Tablet Take 1 Tablet (100 mg total) by mouth Twice daily (Patient taking differently: Take 1 Tablet (100 mg total) by mouth Once a day)    montelukast (SINGULAIR) 10 mg Oral Tablet Take 1 Tablet (10 mg total) by mouth Every evening    nitroGLYCERIN (NITROSTAT) 0.4 mg Sublingual Tablet, Sublingual Place 1 Tablet (0.4 mg total) under the tongue Every 5 minutes as needed for Chest pain for 3 doses over 15 minutes    omega-3 fatty acid (LOVAZA) 1 gram Oral Capsule Take 1 Capsule (1 g total) by mouth Daily    OXcarbazepine  (TRILEPTAL ) 300 mg Oral Tablet Take 3 Tablets (900 mg total) by mouth Twice daily for 180 days    pantoprazole  (PROTONIX ) 40 mg Oral Tablet, Delayed Release (E.C.) TAKE 1 TABLET EVERY DAY    pravastatin  (PRAVACHOL ) 20 mg Oral Tablet TAKE 1 TABLET EVERY EVENING    Triamcinolone  Acetonide (NASACORT ) 55 mcg Nasal Aerosol, Spray Administer 2 Sprays into affected nostril(s) Once a  day        Follow Up 4 Months   Co dizziness  on and off   S/p skin lesion removed from  neck region need path  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  [place] Johnson did mohs x  3        Follow up   Recurrent  cough  Co still cough  no different  and says she is coughing   up  some sputum and  now  very hoarse         Will ask for ENT to  get a good visualization   due to above     Dr  Aden Agreste tessalon   pearls  q 8 hrs but on hs only helps some      FU MDD     Follow up Depression patient takes Wellbutrin  300 daily  still feeling down  discussed options   Abilfy  was  up to  5mg  and  down  to   2.5 mg    and  doing  well due to dizziness   Ok to go off and monitor symptoms   Pt says she is frustrated for her kids and what they have going on       Abilify  added last visit  at   2mg   and pt  is doing some better with loss of her  brother and other things going on but  she  feels we can increase dose      Continue  Wellbutrin   as well      FU  Trigeminal neuralgia    Seen by  Dr Reinhold Carbine  now on trileptal  for trigeminal neuralgia  and  work up reviewed    To see Dr Reinhold Carbine and will tell him that it  has been worse     Pt on  900 mg bid trileptal     S/p  increase  Neurontin   800 mg bid   now  tid  and helping some better         Seen by ENT no new orders       FU Fibromyalgia   Pt is down to  one  daily  of savelle due to cost  on for months and stable  but  will be going off due to cost    Trying pt assistance       Fu eosinophila  asthma sees dr Aden Agreste  on symbicort but still not  great  will add albuterol   until she fu    meds discussed     ie xolair  or nucala  to discuss with Dr Aden Agreste  when she has fu      On brezetri  ( tried  trelegy but not getting  free with Pt  assistance   )        On fasenra      Follow-up neuropathy and fibromyalgia patient is on gabapentin  600 mg twice daily along with Savella     gabapentin   level has been done  7/21  ( Failed  Cymbalta   on allergy  list  )       FU IBS- C  bm currently 4 -5 days  will retry Miralax daily if not effective can try  linzess               REVIEW OF SYSTEMS:   Review of Systems  General: No fever.  No chills.  No weight changes.  HEENT: No vision changes.  Cardiac: No chest pain. No palpitations.  No dizziness.  No light-headedness.  No near syncope.  Resp: No dyspnea at rest, no dyspnea on exertion; no cough or hemoptysis; no orthopnea or PND.  GI: No N/V. No melena.  No bright red blood per rectum.  Ext: No edema.  No claudication.  Neuro: No focal weakness.  No numbness.  All other ROS negative.      Objective: BP 138/82   Pulse 87   Temp 36.3 C (97.3 F)   Ht 1.626 m (5\' 4" )   Wt 76.7 kg (169 lb)   SpO2 96%   BMI 29.01 kg/m                PHYSICAL EXAM  Physical Exam  Gen: NAD. Alert.   HEENT: PERRL; conjunctivae clear. No JVD or carotid bruit.  Well healing scar     Cardiac: RRR with normal S1, S2.   Lungs: Clear to auscultation bilaterally. No rales. No wheezing. No rhonchi.  Abdomen: Soft, non-tender.non-distended  nl bowel sounds    Extremities: No edema. No cyanosis. No clubbing.  Neurologic:  Grossly intact    Lab Results   Component Value Date    CHOLESTEROL 188 07/28/2023    HDLCHOL 47 07/28/2023    LDLCHOL 84 07/28/2023    TRIG 286 (H) 07/28/2023       COMPLETE BLOOD COUNT   Lab Results   Component Value Date    WBC 5.0 07/28/2023    WBC 5.0 07/28/2023    HGB 14.0 07/28/2023    HCT 41.9 07/28/2023    PLTCNT 209 07/28/2023       DIFFERENTIAL  Lab Results   Component Value Date    PMNS 63 07/28/2023    LYMPHOCYTES 23 (L) 07/28/2023    MYELOCYTES 2 07/28/2023    MONOCYTES 9 07/28/2023    EOSINOPHIL 2 07/28/2023    BASOPHILS 0 12/15/2022    BASOPHILS 0.00 12/15/2022    PMNABS 3.30 12/15/2022    LYMPHSABS 1.00 12/15/2022    EOSABS 0.00 12/15/2022    MONOSABS 0.40 12/15/2022        COMPREHENSIVE METABOLIC PANEL  Lab Results   Component Value Date    SODIUM 140 07/28/2023    POTASSIUM 4.3 07/28/2023    CHLORIDE 106 07/28/2023    CO2 29 07/28/2023    ANIONGAP 5  07/28/2023    BUN 20 11/30/2023    CREATININE 0.83 11/30/2023  GLUCOSENF 88 07/28/2023    CALCIUM 9.4 07/28/2023    ALB 4.3 06/17/2022    ALBUMIN 4.1 07/28/2023    TOTALPROTEIN 6.3 (L) 07/28/2023    ALKPHOS 126 (H) 07/28/2023    AST 18 07/28/2023    ALT 20 07/28/2023    GFR 76 11/30/2023                THYROID  STIMULATING HORMONE  Lab Results   Component Value Date    TSH 2.590 07/28/2023        Lab Results   Component Value Date    HA1C 4.9 06/17/2022     Lab Results   Component Value Date    VITD 85 12/12/2021         Orders Placed This Encounter    CBC/DIFF    COMPREHENSIVE METABOLIC PNL, FASTING    THYROID  STIMULATING HORMONE (SENSITIVE TSH)    LIPID PANEL    CBC WITH DIFF      Assessment & Plan  Primary hypertension    Mixed hyperlipidemia    Chronic pain syndrome    Acquired hypothyroidism    Eosinophilic asthma    Fibromyalgia         Meds reviewed as well as labs.  Chart reviewed and updated.   Continue current treatment.  Keep follow-up appointment.   Vaccine hx reviewed.   Fu  savelle  pt assistance  need to look into  this again     Labs in fu

## 2024-01-25 NOTE — Nursing Note (Signed)
 4 month follow up pt states she had a tumor removed 02/11/24 from her throat by dr [place] johnson

## 2024-01-26 MED ORDER — PRAVASTATIN 40 MG TABLET
40.0000 mg | ORAL_TABLET | Freq: Every evening | ORAL | 2 refills | Status: DC
Start: 2024-01-26 — End: 2024-08-01

## 2024-02-05 ENCOUNTER — Other Ambulatory Visit: Payer: Self-pay

## 2024-02-05 ENCOUNTER — Ambulatory Visit
Admission: RE | Admit: 2024-02-05 | Discharge: 2024-02-05 | Disposition: A | Discharge status: Home / Self Care | Payer: Self-pay | Source: Ambulatory Visit | Attending: NEUROLOGY

## 2024-02-05 DIAGNOSIS — H532 Diplopia: Secondary | ICD-10-CM | POA: Insufficient documentation

## 2024-02-05 MED ORDER — GADOBUTROL 10 MMOL/10 ML (1 MMOL/ML) INTRAVENOUS SOLUTION
10.0000 mL | INTRAVENOUS | Status: AC
Start: 2024-02-05 — End: 2024-02-05
  Administered 2024-02-05: 10 mL via INTRAVENOUS

## 2024-02-25 ENCOUNTER — Telehealth (INDEPENDENT_AMBULATORY_CARE_PROVIDER_SITE_OTHER): Payer: Self-pay | Admitting: NEUROLOGY

## 2024-02-25 NOTE — Telephone Encounter (Signed)
 Daughter of patient called and said that patient had two falls today and she fell into the wall she also stated that patients legs has been jerking really bad for a few days and it's makes it hard for her to walk. She wants to know if she can come in for a sooner follow up.    Reynaldo Cavalier, MA  02/25/2024 10:04

## 2024-02-26 ENCOUNTER — Other Ambulatory Visit (INDEPENDENT_AMBULATORY_CARE_PROVIDER_SITE_OTHER): Payer: Self-pay | Admitting: NEUROLOGY

## 2024-02-26 DIAGNOSIS — G500 Trigeminal neuralgia: Secondary | ICD-10-CM

## 2024-03-01 ENCOUNTER — Other Ambulatory Visit: Payer: Self-pay

## 2024-03-01 ENCOUNTER — Ambulatory Visit: Attending: NEUROLOGY

## 2024-03-01 DIAGNOSIS — G500 Trigeminal neuralgia: Secondary | ICD-10-CM | POA: Insufficient documentation

## 2024-03-01 LAB — COMPREHENSIVE METABOLIC PANEL, NON-FASTING
ALBUMIN/GLOBULIN RATIO: 2 — ABNORMAL HIGH (ref 0.8–1.4)
ALBUMIN: 4.3 g/dL (ref 3.5–5.7)
ALKALINE PHOSPHATASE: 116 U/L — ABNORMAL HIGH (ref 34–104)
ALT (SGPT): 14 U/L (ref 7–52)
ANION GAP: 6 mmol/L (ref 4–13)
AST (SGOT): 13 U/L (ref 13–39)
BILIRUBIN TOTAL: 0.4 mg/dL (ref 0.3–1.0)
BUN/CREA RATIO: 21 (ref 6–22)
BUN: 18 mg/dL (ref 7–25)
CALCIUM, CORRECTED: 8.9 mg/dL (ref 8.9–10.8)
CALCIUM: 9.1 mg/dL (ref 8.6–10.3)
CHLORIDE: 106 mmol/L (ref 98–107)
CO2 TOTAL: 29 mmol/L (ref 21–31)
CREATININE: 0.85 mg/dL (ref 0.60–1.30)
ESTIMATED GFR: 74 mL/min/{1.73_m2} (ref >59)
GLOBULIN: 2.1 (ref 2.0–3.5)
GLUCOSE: 89 mg/dL (ref 74–109)
OSMOLALITY, CALCULATED: 283 mosm/kg (ref 270–290)
POTASSIUM: 4.4 mmol/L (ref 3.5–5.1)
PROTEIN TOTAL: 6.4 g/dL (ref 6.4–8.9)
SODIUM: 141 mmol/L (ref 136–145)

## 2024-03-02 ENCOUNTER — Ambulatory Visit (INDEPENDENT_AMBULATORY_CARE_PROVIDER_SITE_OTHER): Payer: Self-pay | Admitting: Internal Medicine

## 2024-03-03 ENCOUNTER — Encounter (INDEPENDENT_AMBULATORY_CARE_PROVIDER_SITE_OTHER): Payer: Self-pay | Admitting: NEUROLOGY

## 2024-04-12 ENCOUNTER — Ambulatory Visit (INDEPENDENT_AMBULATORY_CARE_PROVIDER_SITE_OTHER): Payer: Self-pay | Admitting: NEUROLOGY

## 2024-04-12 ENCOUNTER — Other Ambulatory Visit: Payer: Self-pay

## 2024-04-12 ENCOUNTER — Encounter (INDEPENDENT_AMBULATORY_CARE_PROVIDER_SITE_OTHER): Payer: Self-pay | Admitting: NEUROLOGY

## 2024-04-12 VITALS — BP 155/85 | HR 85 | Temp 97.2°F | Wt 171.2 lb

## 2024-04-12 DIAGNOSIS — H532 Diplopia: Secondary | ICD-10-CM

## 2024-04-12 DIAGNOSIS — G500 Trigeminal neuralgia: Secondary | ICD-10-CM

## 2024-04-12 DIAGNOSIS — G4733 Obstructive sleep apnea (adult) (pediatric): Secondary | ICD-10-CM

## 2024-04-12 MED ORDER — GABAPENTIN 800 MG TABLET
800.0000 mg | ORAL_TABLET | Freq: Three times a day (TID) | ORAL | 0 refills | Status: DC
Start: 2024-04-12 — End: 2024-08-01

## 2024-04-12 NOTE — Progress Notes (Signed)
 ASSESSMENT  TRIGEMINAL NEURALGIA:  She is a 68 year old woman who returns to clinic for 1-2 years of progressive facial pain and numbness on the right side.  Symptoms started somewhat insidiously with frank numbness before becoming painful and tender to touch.  Her exam is notable for sensory deficits in V2 on the right side. We did obtain an MRI and MRA of the head which were essentially unremarkable. Nerve conduction studies revealed decrease amplitudes of the right trigeminal nerve compared to the left. She has experienced significant response to Trileptal  but has required high doses. She still has intermittent breakthrough symptoms and is also on gabapentin . This again has been helpful but incomplete. TID dosing has improved this. There has been some concern of medication side effect leading to dizziness which is certainly possible, however, she is overall happy with the balance of side effects to pain management.   2.   EXTREMITY PARESTHESIAS: She has a longer standing history of paresthesias in the arms and legs.  These can be painful at times.  Her exam was previously notable for mostly small fiber deficits in a patchy distribution in the lower extremities calling into question possible small fiber neuropathy which does have an association with fibromyalgia.  EMG revealed no evidence of a large fiber process. The addition of trileptal  and gabapentin  has helped her symptoms. Rheumatologic workup has been unremarkable.   3. MEMORY CHANGES: She has previously reported decline in cognition though this has been stable over multiple visits. She does report a history of severe sleep apnea. Unfortunately she has not been able to tolerate CPAP due to the straps touching her face. I do think this is a big contributing factor to her deficits. She is scheduled to see sleep medicine and fortunately has been able to tolerate the mask more recently so we will continue to monitor.   4. DIPLOPIA: This has been new in the last  3 months. It appears to have been gradual in onset. She has frank binocular diplopia concerning for cranial nerve abnormality though it is not readily apparent on exam. There appears to be no fatigability. Exam also shows some mild right lower facial weakness concerning for facial nerve involvement. Updated MRI and MG antibody panel was negative. MRV was completed which was likewise unrevealing. She has seen ophthalmology with good response to eye lubrication and new glasses so less likely to be a neurologic issue.     PLAN  1. Continue Trileptal   900 mg BID      Continue gabapentin   800 TID   2.  Continue to monitor       If worsening, could consider the utility of skin biopsy to confirm presence of SFN  3.   Encouraged continued CPAP use        Follow-up with sleep medicine   4.   No further neurologic workup    Return to clinic 4 months    Thank you for allowing me to participate in your patient's care and please do not hesitate to contact me for any questions or concerns.    S. Zach Dorian Renfro, DO  Assistant Professor of Neurology  [place]  [facility]     I personally spent a total of 30 minutes today preparing to see the patient, in the encounter with the patient, and documenting after the visit.    [id number]: I will continue to be the provider focal point in managing the chronic complex neurological condition  ==========================================================================================================================================    NAME:  Jo Wong  DOB:  Apr 16, 1956  VISIT DATE:  06/17/2022    CC:  Facial Numbness    Patient seen in consultation at the request of Dr. Oneil Alvine  History obtained from the patient and chart/records  Age of patient:  68 y.o.    INTERVAL: Since last visit, there were some concerns of medications causing dizziness. She decreased Trileptal  to 600 mg BID but had to return to original dosing because of pain. She has since been doing well.  She does have occasional dizziness. Pain is relatively well controlled. She thinks cognition is stable. She has been able to wear her CPAP recently. Sees sleep medicine in two days. Diplopia has improved with eye lubrication and new prescription. No other new concerns.     HPI:   I had the pleasure of seeing your patient in neurology clinic for an outpatient consultation, who is a 68 y.o. year old female who was referred for evaluation of facial numbness.  Please allow me to summarize the history for the record.     She states that symptoms began in November 2022.  It started with numbness that began under the right eye.  She did have a red spot in the same location at the time.  She does have a history of skin cancer and was given topical chemotherapeutic agent by her dermatologist to treat this.  Despite this, the numbness persisted and then began to extend down the right side of the face to the lip and laterally towards the ear.  Gradually it became painful and tender to touch.  She reports the even light stimulus now can cause pain on the right side of her face.  She denies any weakness.  She does report that her right eye seems to have had a very mild decline in visual acuity.  She denies any changes in taste.  She does have a previous history of headaches though nothing with similar symptoms.  She also reports more longstanding numbness and tingling in the arms and legs.  She does report a history of fibromyalgia.  She does have some lightheadedness with standing.  She does have issues with constipation and diarrhea as she has a history of IBS.    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    Headache, post-traumatic    Thyroid  nodule    Thrombosis of mesenteric vein (CMS HCC)    Thromboembolic  disorder (CMS HCC)    Fibromyositis    Multiple nodules of lung    IBS (irritable bowel syndrome)    Insomnia    Acquired hypothyroidism    Mixed hyperlipidemia    Hypertension    Major depressive disorder    Prolapsed cervical intervertebral disc    Cyst of ovary    Fibromyalgia    Generalized anxiety disorder    Actinic keratosis    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    OSA on CPAP    TMJ (dislocation of temporomandibular joint)    Chronic cough     Past Surgical History:   Procedure Laterality Date    BILATERAL OOPHORECTOMY      COLONOSCOPY  2017    Dr mal    HX CHOLECYSTECTOMY      HX LASER SKIN LESION REMOVAL      01/31/22    HX THYROIDECTOMY      HX TUBAL LIGATION  HX TUMOR REMOVAL  01/12/2024    throat    NECK SURGERY      m[facility]s surgery x 3  under  chin  on left side    [place]  01/12/2024    SKIN GRAFT      12/2020  right upper arm    SKIN LESION EXCISION      removal of pigmented skin lesion    SQUAMOUS CELL CARCINOMA EXCISION      10/2019  left shoulder and right front shoulder 03/2021  back right         Family Medical History:       Problem Relation (Age of Onset)    Aortic Aneurysm Brother    Arthritis-rheumatoid Sister    Back Problems Brother    Blood Clots Mother    Breast Disease Sister    Cerebral Aneurysm Maternal Aunt    Deep vein thrombosis Sister    Heart Disease Mother    Hypertension (High Blood Pressure) Mother    Other Father    Primary Brain tumor Brother    Stroke Mother    Ulcers Brother            Current Outpatient Medications   Medication Sig Dispense Refill    albuterol  sulfate (PROVENTIL  HFA/VENTOLIN  HFA) 90 mcg Inhalation Inhaler Take 2 Puffs by inhalation Every 6 hours as needed 18 g 3    ARIPiprazole  (ABILIFY ) 5 mg Oral Tablet Take 1 Tablet (5 mg total) by mouth Once a day 90 Tablet 3    budesonide-glycopyr-formoterol (BREZTRI  AEROSPHERE) 160-9-4.8 mcg/actuation Inhalation HFA Aerosol Inhaler Take 2 Puffs by inhalation Twice daily 3 Each 2    buPROPion  (WELLBUTRIN  XL)  300 mg extended release 24 hr tablet Take 1 Tablet (300 mg total) by mouth Once a day 90 Tablet 2    Cholecalciferol, Vitamin D3, 50 mcg (2,000 unit) Oral Capsule Take 1 Capsule by mouth Once a day      clotrimazole (MYCELEX) 10 mg Mucous Membrane Troche DISSOLVE 1 TABLET BY MOUTH 4 TIMES DAILY (AFTER MEALS AND AT BEDTIME)      famotidine  (PEPCID ) 40 mg Oral Tablet TAKE 1 TABLET EVERY EVENING 90 Tablet 3    gabapentin  (NEURONTIN ) 800 mg Oral Tablet Take 1 Tablet (800 mg total) by mouth Three times a day for 90 days 270 Tablet 0    levoFLOXacin  (LEVAQUIN ) 500 mg Oral Tablet Take 1.5 Tablets (750 mg total) by mouth Daily 10 Tablet 0    levothyroxine  (SYNTHROID) 50 mcg Oral Tablet TAKE 1 TABLET EVERY DAY 90 Tablet 3    losartan  (COZAAR ) 50 mg Oral Tablet TAKE 1 TABLET ONE TIME DAILY 90 Tablet 3    meclizine  (ANTIVERT ) 12.5 mg Oral Tablet Take 1 Tablet (12.5 mg total) by mouth Every 12 hours as needed 30 Tablet 0    mecobalamin, vitamin B12, 1,000 mcg Oral Tablet, Chewable Chew 1 Tablet (1,000 mcg total) Once a day      milnacipran  (SAVELLA ) 100 mg Oral Tablet Take 1 Tablet (100 mg total) by mouth Twice daily (Patient taking differently: Take 1 Tablet (100 mg total) by mouth Once a day) 180 Tablet 2    montelukast (SINGULAIR) 10 mg Oral Tablet Take 1 Tablet (10 mg total) by mouth Every evening      nitroGLYCERIN (NITROSTAT) 0.4 mg Sublingual Tablet, Sublingual Place 1 Tablet (0.4 mg total) under the tongue Every 5 minutes as needed for Chest pain for 3 doses over 15 minutes  omega-3 fatty acid (LOVAZA) 1 gram Oral Capsule Take 1 Capsule (1 g total) by mouth Daily      OXcarbazepine  (TRILEPTAL ) 300 mg Oral Tablet Take 3 Tablets (900 mg total) by mouth Twice daily for 180 days 540 Tablet 1    pantoprazole  (PROTONIX ) 40 mg Oral Tablet, Delayed Release (E.C.) TAKE 1 TABLET EVERY DAY 90 Tablet 3    pravastatin  (PRAVACHOL ) 40 mg Oral Tablet Take 1 Tablet (40 mg total) by mouth Every evening 90 Tablet 2    Triamcinolone   Acetonide (NASACORT ) 55 mcg Nasal Aerosol, Spray Administer 2 Sprays into affected nostril(s) Once a day 16.9 mL 5     No current facility-administered medications for this visit.     Allergies   Allergen Reactions    Doxycycline  Other Adverse Reaction (Add comment)     Sweats and flushing    vomiting    Duloxetine  Other Adverse Reaction (Add comment)     Unk.     Social History     Socioeconomic History    Marital status: Married     Spouse name: Not on file    Number of children: Not on file    Years of education: Not on file    Highest education level: Not on file   Occupational History    Not on file   Tobacco Use    Smoking status: Never    Smokeless tobacco: Never   Vaping Use    Vaping status: Never Used   Substance and Sexual Activity    Alcohol use: Never    Drug use: Never    Sexual activity: Not on file   Other Topics Concern    Not on file   Social History Narrative    Not on file     Social Determinants of Health     Financial Resource Strain: Low Risk  (03/04/2022)    Financial Resource Strain     SDOH Financial: No   Transportation Needs: Low Risk  (03/04/2022)    Transportation Needs     SDOH Transportation: No   Social Connections: Low Risk  (03/04/2022)    Social Connections     SDOH Social Isolation: 5 or more times a week   Intimate Partner Violence: Low Risk  (03/04/2022)    Intimate Partner Violence     SDOH Domestic Violence: No   Housing Stability: Low Risk  (03/04/2022)    Housing Stability     SDOH Housing Situation: I have housing.     SDOH Housing Worry: No       ============================================================================================================================================  GENERAL EXAMINATION  BP (!) 155/85 (Site: Left Arm, Patient Position: Sitting, Cuff Size: Adult)   Pulse 85   Temp 36.2 C (97.2 F) (Temporal)   Wt 77.7 kg (171 lb 3.2 oz)   SpO2 95%   BMI 29.39 kg/m   Vital signs personally reviewed  General: No acute distress, alert  HEENT:  Normocephalic, no scleral icterus  Extremities: No significant edema, No cyanosis    NEUROLOGIC EXAM  On neurological exam, patient was awake, alert and answering questions appropriately  Speech was fluent, without dysarthria or aphasia.    CN  II:  Visual fields intact to confrontation  III, IV, VI: reports frank horizontal diplopia with left inferior gaze though also present with sustained upgaze, no ptosis  V:  Deferred given pain  VII: right lower facial weakness  VIII: grossly intact  IX, X: symmetric palatal elevation  XI: normal strength of  trapezius and sternocleidomastoid bilaterally  XII: tongue midline with full movements    MOTOR  Bulk: normal  Abnormal Movements: none    Strength:     MRC Grading Scale   Right Left   Deltoid 5 5   Biceps 5 5   Triceps 5 5   Wrist Extension - -   Wrist Flexion - -   Finger Extension - -   Finger Abduction - -   Finger Flexion - -   Hip Flexion 5 5   Hip Extension - -   Hip Abduction - -   Hip Adduction - -   Knee Extension - -   Knee Flexion - -   Ankle Dorsiflexion - -   Ankle Plantarflexion - -   Toe Extension - -   Toe Flexion - -             REFLEXES   Right Left   Biceps 2 2   Triceps 2 2   Brachioradialis 2 2   Patellar 2 2   Achilles 1 1   Plantar - -   Hoffman - -   Pectoralis - -   Jaw Jerk - -       SENSORY  Light touch: intact throughout all extremities    GAIT  General: casual, normal gait    ================================================================================================================================LABS  Personal Review of prior labs is notable for:   2025  MG antibody panel negative  2024  CMP WNL   TSH normal  CBC largely WNL   2023  Hepatitis panel negative  Vitamin-D within normal limits   LDL 127   TSH within normal limits   CMP largely within normal limits   CBC largely within normal limits  SSA/B antibodies negative   Rheumatoid factor negative   Anti CCP antibody negative   ANA negative   SPEP within normal limits   B12 greater  than 1500   A1c 4.9  IMAGING  Personal Review of imaging is notable for:    MRV intracranial May 2025 - unremarkable  MRI Brain w/o contrast 07/17/2022:  Mild degree of atrophy likely in keeping with age otherwise essentially unremarkable study   MRA Head July 17, 2022:  No vascular compression of the trigeminal nerve on the right  OTHER DIAGNOSTICS  Personal Review of other prior diagnostics is notable for:  Not applicable

## 2024-04-14 ENCOUNTER — Ambulatory Visit (HOSPITAL_COMMUNITY): Payer: Self-pay | Admitting: SLEEP MEDICINE

## 2024-04-18 ENCOUNTER — Other Ambulatory Visit (INDEPENDENT_AMBULATORY_CARE_PROVIDER_SITE_OTHER): Payer: Self-pay | Admitting: Internal Medicine

## 2024-04-19 ENCOUNTER — Other Ambulatory Visit: Payer: Self-pay

## 2024-04-20 ENCOUNTER — Other Ambulatory Visit: Payer: Self-pay

## 2024-04-20 ENCOUNTER — Encounter (INDEPENDENT_AMBULATORY_CARE_PROVIDER_SITE_OTHER): Payer: Self-pay | Admitting: Internal Medicine

## 2024-04-20 ENCOUNTER — Ambulatory Visit: Payer: Self-pay | Attending: Internal Medicine | Admitting: Internal Medicine

## 2024-04-20 VITALS — BP 130/82 | HR 100 | Ht 64.0 in | Wt 169.4 lb

## 2024-04-20 DIAGNOSIS — F330 Major depressive disorder, recurrent, mild: Secondary | ICD-10-CM | POA: Insufficient documentation

## 2024-04-20 DIAGNOSIS — Z13820 Encounter for screening for osteoporosis: Secondary | ICD-10-CM | POA: Insufficient documentation

## 2024-04-20 DIAGNOSIS — Z1231 Encounter for screening mammogram for malignant neoplasm of breast: Secondary | ICD-10-CM | POA: Insufficient documentation

## 2024-04-20 DIAGNOSIS — Z0000 Encounter for general adult medical examination without abnormal findings: Secondary | ICD-10-CM | POA: Insufficient documentation

## 2024-04-20 DIAGNOSIS — F339 Major depressive disorder, recurrent, unspecified: Secondary | ICD-10-CM | POA: Insufficient documentation

## 2024-04-20 DIAGNOSIS — Z1239 Encounter for other screening for malignant neoplasm of breast: Secondary | ICD-10-CM

## 2024-04-20 NOTE — Progress Notes (Signed)
 [facility]  [address]  [place] [address]  Operated by [facility]  Medicare Annual Wellness Visit    Name: Jo Wong MRN:  [id number]   Date: 04/20/2024 Age: 68 y.o.       SUBJECTIVE:   Jo Wong is a 68 y.o. female for presenting for Medicare Wellness exam.   I have reviewed and reconciled the medication list with the patient today.        04/20/2024    10:46 AM 04/20/2023    10:28 AM 04/07/2022    10:00 AM   Comprehensive Health Assessment-Adult   Do you wish to complete this form? Yes Yes Yes   During the past 4 weeks, how would you rate your health in general? Fair Good Good   During the past 4 weeks, how much difficulty have you had doing your usual activities inside and outside your home because of medical or emotional problems? Much difficulty A little bit of difficulty A little bit of difficulty   During the past 4 weeks, was someone available to help you if you needed and wanted help? No Yes, as much as I wanted Yes, as much as I wanted   In the past year, how many times have you gone to the emergency department or been admitted to a hospital for a health problem? 1 time None None   Are you generally satisfied with your sleep? No No No   Do you have enough money to buy things you need in everyday life, such as food, clothing, medicines, and housing? Yes, always Sometimes Yes, always   Can you get to places beyond walking distance without help?  (For example, can you drive your own car or travel alone on buses)? Yes Yes No   Do you fasten your seatbelt when you are in a car? Yes, usually Yes, usually Yes, usually   Do you exercise 20 minutes 3 or more days per week (such as walking, dancing, biking, mowing grass, swimming)? No, I usually don't exercise this much Yes, some of the time Yes, most of the time   How often do you eat food that is healthy (fruits, vegetables, lean meats) instead of unhealthy (sweets, fast food, junk food, fatty foods)? Some of  the time Some of the time Most of the time   How often do you have trouble taking medicines the eay you are told to take them? I always take them as prescribed I always take them as prescribed I always take them as prescribed   Do you need any help communicating with your doctors and nurses because of vision or hearing problems? No No No   During the past 12 months, have you experienced confusion or memory loss that is happening more often or is getting worse? No No No   Do you have one person you think of as your personal doctor (primary care provider or family doctor)?  Yes Yes   If you are seeing a Primary Care Provider (PCP) or family doctor. please list their name suzen Umar [name] [name] [name]   Are you now also seeing any specialist physician(s) (such as eye doctor, foot doctor, skin doctor)? Yes Yes Yes   If you are seeing a specialist for anything such as foot, eye, skin, etc.  please list their name(s) dr nydia   dr vicci  dr patel   dr cox    [facility]x  dr nydia  dr johnson    [facility] asbury  dr fig  chad johnson   [facility]   How confident are you that you can control or manage most of your health problems? Somewhat confident Somewhat confident Somewhat confident       I have reviewed and updated as appropriate the past medical, family and social history. 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     Stomach cramps     Thromboembolic disorder (CMS HCC)     Thrombosis of mesenteric vein (CMS HCC)     Thyroid  nodule      Past Surgical History:   Procedure Laterality Date    Bilateral oophorectomy      Colonoscopy  2017    Hx cholecystectomy      Hx laser skin lesion removal      Hx thyroidectomy      Hx tubal ligation      Hx tumor removal  01/12/2024    Neck surgery      Skin graft      Skin lesion excision      Squamous cell carcinoma excision       Current Outpatient Medications   Medication Sig    albuterol  sulfate (PROVENTIL  HFA/VENTOLIN  HFA) 90 mcg Inhalation Inhaler Take 2 Puffs by inhalation Every 6 hours as needed    ARIPiprazole  (ABILIFY ) 5 mg Oral Tablet Take 1 Tablet (5 mg total) by mouth Once a day    budesonide-glycopyr-formoterol (BREZTRI  AEROSPHERE) 160-9-4.8 mcg/actuation Inhalation HFA Aerosol Inhaler Take 2 Puffs by inhalation Twice daily    buPROPion  (WELLBUTRIN  XL) 300 mg extended release 24 hr tablet Take 1 Tablet (300 mg total) by mouth Once a day    Cholecalciferol, Vitamin D3, 50 mcg (2,000 unit) Oral Capsule Take 1 Capsule by mouth Once a day    clotrimazole (MYCELEX) 10 mg Mucous Membrane Troche DISSOLVE 1 TABLET BY MOUTH 4 TIMES DAILY (AFTER MEALS AND AT BEDTIME)    famotidine  (PEPCID ) 40 mg Oral Tablet TAKE 1 TABLET EVERY EVENING    gabapentin  (NEURONTIN ) 800 mg Oral Tablet Take 1 Tablet (800 mg total) by mouth Three times a day for 90 days    levoFLOXacin  (LEVAQUIN ) 500 mg Oral Tablet Take 1.5 Tablets (750 mg total) by mouth Daily    levothyroxine  (SYNTHROID) 50 mcg Oral Tablet TAKE 1 TABLET EVERY DAY    losartan  (COZAAR ) 50 mg Oral Tablet TAKE 1 TABLET ONE TIME DAILY    meclizine  (ANTIVERT ) 12.5 mg Oral Tablet Take 1 Tablet (12.5 mg total) by mouth Every 12 hours as needed    mecobalamin, vitamin B12, 1,000 mcg Oral Tablet, Chewable Chew 1 Tablet (1,000 mcg total) Once a day  milnacipran  (SAVELLA ) 100 mg Oral Tablet Take 1 Tablet (100 mg total) by mouth Twice  daily (Patient taking differently: Take 1 Tablet (100 mg total) by mouth Once a day)    montelukast (SINGULAIR) 10 mg Oral Tablet Take 1 Tablet (10 mg total) by mouth Every evening    nitroGLYCERIN (NITROSTAT) 0.4 mg Sublingual Tablet, Sublingual Place 1 Tablet (0.4 mg total) under the tongue Every 5 minutes as needed for Chest pain for 3 doses over 15 minutes    omega-3 fatty acid (LOVAZA) 1 gram Oral Capsule Take 1 Capsule (1 g total) by mouth Daily    OXcarbazepine  (TRILEPTAL ) 300 mg Oral Tablet Take 3 Tablets (900 mg total) by mouth Twice daily for 180 days    pantoprazole  (PROTONIX ) 40 mg Oral Tablet, Delayed Release (E.C.) TAKE 1 TABLET EVERY DAY    pravastatin  (PRAVACHOL ) 40 mg Oral Tablet Take 1 Tablet (40 mg total) by mouth Every evening    Triamcinolone  Acetonide (NASACORT ) 55 mcg Nasal Aerosol, Spray Administer 2 Sprays into affected nostril(s) Once a day     Family Medical History:       Problem Relation (Age of Onset)    Aortic Aneurysm Brother    Arthritis-rheumatoid Sister    Back Problems Brother    Blood Clots Mother    Breast Disease Sister    Cerebral Aneurysm Maternal Aunt    Deep vein thrombosis Sister    Heart Disease Mother    Hypertension (High Blood Pressure) Mother    Other Father    Primary Brain tumor Brother    Stroke Mother    Ulcers Brother            Social History     Socioeconomic History    Marital status: Married   Tobacco Use    Smoking status: Never    Smokeless tobacco: Never   Vaping Use    Vaping status: Never Used   Substance and Sexual Activity    Alcohol use: Never    Drug use: Never     Social Determinants of Health     Financial Resource Strain: Low Risk  (03/04/2022)    Financial Resource Strain     SDOH Financial: No   Transportation Needs: Low Risk  (03/04/2022)    Transportation Needs     SDOH Transportation: No   Social Connections: Low Risk  (03/04/2022)    Social Connections     SDOH Social Isolation: 5 or more times a week   Intimate Partner Violence: Low Risk   (03/04/2022)    Intimate Partner Violence     SDOH Domestic Violence: No   Housing Stability: Low Risk  (03/04/2022)    Housing Stability     SDOH Housing Situation: I have housing.     SDOH Housing Worry: No   Health Literacy: Medium Risk (04/20/2024)    Health Literacy     SDOH Health Literacy: Occasionally   Employment Status: Low Risk  (03/04/2022)    Employment Status     SDOH Employment: Otherwise unemployed but not seeking work (ex. Consulting civil engineer, retired, disabled, unpaid primary care giver)         List of Current Health Care Providers   Care Team       PCP       Name Type Specialty Phone Number    [place], Suzen DELENA RIGGERS Physician Assistant PHYSICIAN ASSISTANT [phone]              Care Team  No care team found                      Health Maintenance   Topic Date Due    Shingles Vaccine (2 of 2) 09/23/2024 (Originally 09/22/2023)    RSV Adult 60+ or Pregnancy (1 - Risk 60-74 years 1-dose series) 09/23/2024 (Originally 04/27/2014)    Influenza Vaccine (1) 05/23/2024    Pneumococcal Vaccination, Age 53+ (2 of 2 - PPSV23) 07/27/2024    Breast Cancer Screening  02/24/2025    Colonoscopy  04/08/2026    Osteoporosis screening  02/22/2032    Adult Tdap-Td (2 - Td or Tdap) 07/27/2033    Hepatitis C screening  Completed    Medicare Annual Wellness Visit - Calendar Year Insurers  Completed    Covid-19 Vaccine  Discontinued     Medicare Wellness Assessment   Medicare initial or wellness physical in the last year?: Yes  Advance Directives   Does patient have a living will or MPOA: No           Advance directive information given to the patient today?: Yes      Activities of Daily Living   Do you need help with dressing, bathing, or walking?: No   Do you need help with shopping, housekeeping, medications, or finances?: No   Do you have rugs in hallways, broken steps, or poor lighting?: No   Do you have grab bars in your bathroom, non-slip strips in your tub, and hand rails on your stairs?: Yes   Cognitive Function Screen  (1=Yes, 0=No)   What is you age?: Correct   What is the time to the nearest hour?: Correct   What is the year?: Correct   What is the name of this clinic?: Correct   Can the patient recognize two persons (the doctor, the nurse, home help, etc.)?: Correct   What is the date of your birth? (day and month sufficient) : Correct   In what year did World War II end?: Incorrect   Who is the current president of the [place] ?: Correct   Count from 20 down to 1?: Correct   What address did I give you earlier?: Incorrect   Total Score: 8   Interpretation of Total Score: Greater than 6 Normal   Fall Risk Screen   Do you feel unsteady when standing or walking?: Yes  Do you worry about falling?: No  Have you fallen in the past year?: Yes  How many times have you fallen?: 2 or more times  Were you ever injured from falling?: No   Depression Screen     Little interest or pleasure in doing things.: Not at all  Feeling down, depressed, or hopeless: Not at all  PHQ 2 Total: 0     Pain Score   Pain Score:   0 - No pain    Substance Use-Abuse Screening     Tobacco Use     In Past 12 MONTHS, how often have you used any tobacco product (for example, cigarettes, e-cigarettes, cigars, pipes, or smokeless tobacco)?: Never     Alcohol use     In the PAST 12 MONTHS, how often have you had 5 (men)/4 (women) or more drinks containing alcohol in one day?: Never     Prescription Drug Use     In the PAST 12 months, how often have you used any prescription medications just for the feeling, more than prescribed, or that were not prescribed for  you? Prescriptions may include: opioids, benzodiazepines, medications for ADHD: Never           Illicit Drug Use   In the PAST 12 MONTHS, how often have you used any drugs, including marijuana, cocaine or crack, heroin, methamphetamine, hallucinogens, ecstasy/MDMA?: Never            Urine Incontinence Screen   Urinary Incontinence Screen  Do you ever leak urine when you don't want to?: No         OBJECTIVE:   BP 130/82 (Site: Left Arm, Patient Position: Sitting)   Pulse 100   Ht 1.626 m (5' 4)   Wt 76.8 kg (169 lb 6.4 oz)   SpO2 94%   BMI 29.08 kg/m        Other appropriate exam:  Exam-AMB  Const  General: cooperative, healthy appearing and no acute distress  Orientation/Consciousness: patient oriented x3  HENMT  Ears: hearing grossly normal bilaterally  Eyes  General: appearance normal, both eyes and all related structures  Conjunctivae: conjunctivae normal  Sclera: sclerae normal  EOM: EOM intact bilaterally  Neck  Neck: normal visual inspection and no lymphadenopathy  Thyroid : thyroid  normal  Carotids: no bruits  Resp  Effort & Inspection: normal respiratory effort  Auscultation: clear to auscultation bilaterally, no crackles, no rales, no rhonchi and no wheezes  Cardio  Jugular venous pressure: no JVD  Rate: regular rate  Rhythm: regular rhythm  Heart Sounds: S1 normal, S2 normal, no click, no murmurs and no rubs  Bruits: no carotid bruits  GI  Inspection: Yes normal to inspection  Palpation: soft, no hepatosplenomegaly, no guarding, no masses and nontender  Auscultation: normal bowel sounds  Neuro  General: patient oriented x3  Extrem  General: normal to inspection, normal exam except as noted, clubbing, cyanosis or edema noted, normal gait and other  Right side facial drooping  pt says she drools  more  discussed  facial exercises    Psych  Mental Status: mental status grossly normal  Mood: congruent mood  Affect: normal affect  Insight: insight good  Judgment: judgment good       There are no preventive care reminders to display for this patient.     ASSESSMENT & PLAN:   Assessment/Plan   1. Medicare annual wellness visit, subsequent       Identified Risk Factors/ Recommended Actions     Fall Risk Follow up plan of care: Discussed optimizing home safety  The PHQ 2 Total: 0 depression screen is interpreted as negative.        Advanced Directives: Patient agreeable to - Advanced Directives  discussed and patient elected to take home to review.         The patient has been educated about risk factors and recommended preventive care. Written Prevention Plan completed/ updated and given to patient (see After Visit Summary).    Eye exam  up to date and double vision  better    She is to ask Neurology  about botox  vs  speech therapy etc  as  she says it gets embarrassing  to eat in public Exercises discussed with pt     MPOA and living will discussed       Labs overall stable       OSA on CPAP working on new mask    and  doing better     Mammogram and dexa ordered      Suzen DELENA Asa, PA-C

## 2024-04-20 NOTE — Patient Instructions (Signed)
 Medicare Preventive Services  Medicare coverage information Recommendation for YOU   Heart Disease and Diabetes   Lipid profile Every 5 years or more often if at risk for cardiovascular disease     Lab Results   Component Value Date    CHOLESTEROL 200 (H) 01/25/2024    HDLCHOL 61 01/25/2024    LDLCHOL 106 (H) 01/25/2024    TRIG 163 (H) 01/25/2024         Diabetes Screening    Yearly for those at risk for diabetes, 2 tests per year for those with prediabetes Last Glucose: 89    Diabetes Self Management Training or Medical Nutrition Therapy  For those with diabetes, up to 10 hrs initial training within a year, subsequent years up to 2 hrs of follow up training Optional for those with diabetes     Medical Nutrition Therapy  Three hours of one-on-one counseling in first year, two hours in subsequent years Optional for those with diabetes, kidney disease   Intensive Behavioral Therapy for Obesity  Face-to-face counseling, first month every week, month 2-6 every other week, month 7-12 every month if continued progress is documented Optional for those with Body Mass Index 30 or higher  Your Body mass index is 29.08 kg/m.   Tobacco Cessation (Quitting) Counseling   Covers up to 8 smoking and tobacco-use cessation counseling sessions in a 35-month period.    Optional for those that use tobacco   Cancer Screening Last Completion Date   Colorectal screening   For anyone age 49 to 56 or any age if high risk:  Screening Colonoscopy every 10 yrs if low risk,  more frequent if higher risk  OR  Cologuard Stool DNA test once every 3 years OR  Fecal Occult Blood Testing yearly OR  Flexible  Sigmoidoscopy  every 5 yr OR  CT Colonography every 5 yrs      See below for due date if applicable.   Screening Pap Test   Recommended every 3 years for all women age 57 to 51, or every five years if combined with HPV test (routine screening not needed after total hysterectomy).  Medicare covers every 2 years or yearly if high risk.  Screening  Pelvic Exam   Medicare covers every 2 years, yearly if high risk or childbearing age with abnormal Pap in last 3 yrs.     See below for due date if applicable.   Screening Mammogram   Recommended every 2 years for women age 52 to 78, or more frequent if you have a higher risk. Selectively recommended for women between 40-49 based on shared decisions about risk. Covered by Medicare up to every year for women age 1 or older --02/25/2023  See below for due date if applicable.         Lung Cancer Screening  Annual low dose computed tomography (LDCT scan) is recommended for those age 52-80 who smoked 20 pack-years and are current smokers or quit smoking within past 15 years, after counseling by your doctor or nurse clinician about the possible benefits or harms.     See below for due date if applicable.   Vaccinations   Respiratory syncytial virus (RSV)  Age 55 years or older: Based on shared clinical decision-making with your provider.  Pneumococcal Vaccine  Recommended routinely age 24+ with one or two separate vaccines based on your risk. Recommended before age 23 if medical conditions with increased risk  Seasonal Influenza Vaccine  Once every flu season  Hepatitis B Vaccine  3 doses if risk (including anyone with diabetes or liver disease)  Shingles Vaccine  Two doses at age 56 or older  Diphtheria Tetanus Pertussis Vaccine  ONCE as adult, booster every 10 years     Immunization History   Administered Date(s) Administered   . Influenza Vaccine, 6 month-adult 06/24/2019, 08/02/2020, 08/01/2021, 08/11/2022   . Influenza Vaccine, 65+ 07/31/2023   . Shingrix  - Zoster Vaccine 07/28/2023   . Tetanus Toxoid/Diphtheria Toxoid/Acellular Pertussis Vaccine, Adsorbed 07/28/2023   . VAXNEUVANCE  07/28/2023     Shingles vaccine and Diphtheria Tetanus Pertussis vaccines are available at pharmacies or local health department without a prescription.   Other Preventative Screening  Last Completion Date   Bone Densitometry    Screening: All females ages 52 and older every 10 years if initial screening normal. Postmenopausal women ages 17-64 need screening with one or more risk factor: previous fracture, parental hip fracture, current smoker, low body weight, excessive alcohol use, Rheumatoid Arthritis   For women with diagnosed Osteoporosis, follow up is recommended every 2 years or a frequency recommended by your provider.     --02/21/2022  See below for due date if applicable.     Glaucoma Screening   Yearly if in high risk group such as diabetes, family history, African American age 82+ or Hispanic American age 55+   See your eye care provider for screening.   Hepatitis C Screening   Recommended  for those born between ages 18-79 years.   --12/12/2021  See below for due date if applicable.     HIV Testing  Recommended routinely at least ONCE, covered every year for age 27 to 60 regardless of risk, and every year for age over 62 who ask for the test or higher risk. Yearly or up to 3 times in pregnancy         See below for due date if applicable.   Abdominal Aortic Aneurysm Screening Ultrasound   Once with a family history of abdominal aortic aneurysms OR a female between65-75 and have smoked at least 100 cigarettes in your lifetime.         See below for due date if applicable.       Your Personalized Schedule for Preventive Tests   Health Maintenance: Pending and Last Completed       Date Due Completion Date    Shingles Vaccine (2 of 2) 09/23/2024 (Originally 09/22/2023) 07/28/2023    RSV Adult 60+ or Pregnancy (1 - Risk 60-74 years 1-dose series) 09/23/2024 (Originally 04/27/2014) ---    Influenza Vaccine (1) 05/23/2024 07/31/2023    Pneumococcal Vaccination, Age 2+ (2 of 2 - PPSV23) 07/27/2024 07/28/2023    Breast Cancer Screening 02/24/2025 02/25/2023    Colonoscopy 04/08/2026 ---    Osteoporosis screening 02/22/2032 02/21/2022    Adult Tdap-Td (2 - Td or Tdap) 07/27/2033 07/28/2023                For Information on Advanced Directives  for Health Care:  [place]:  [email or web address]  [place], [place], [place], [place] General Information: [email or web address]

## 2024-04-21 ENCOUNTER — Encounter (INDEPENDENT_AMBULATORY_CARE_PROVIDER_SITE_OTHER): Payer: Self-pay | Admitting: Internal Medicine

## 2024-04-22 ENCOUNTER — Telehealth (INDEPENDENT_AMBULATORY_CARE_PROVIDER_SITE_OTHER): Payer: Self-pay | Admitting: Internal Medicine

## 2024-04-22 ENCOUNTER — Encounter (INDEPENDENT_AMBULATORY_CARE_PROVIDER_SITE_OTHER): Payer: Self-pay

## 2024-04-25 ENCOUNTER — Other Ambulatory Visit (INDEPENDENT_AMBULATORY_CARE_PROVIDER_SITE_OTHER): Payer: Self-pay

## 2024-05-04 ENCOUNTER — Other Ambulatory Visit (INDEPENDENT_AMBULATORY_CARE_PROVIDER_SITE_OTHER): Payer: Self-pay

## 2024-05-04 DIAGNOSIS — Z13820 Encounter for screening for osteoporosis: Secondary | ICD-10-CM

## 2024-05-04 DIAGNOSIS — Z1239 Encounter for other screening for malignant neoplasm of breast: Secondary | ICD-10-CM

## 2024-05-26 ENCOUNTER — Encounter (INDEPENDENT_AMBULATORY_CARE_PROVIDER_SITE_OTHER): Payer: Self-pay | Admitting: NEUROLOGY

## 2024-05-30 ENCOUNTER — Ambulatory Visit (INDEPENDENT_AMBULATORY_CARE_PROVIDER_SITE_OTHER): Payer: Self-pay | Admitting: NEUROLOGY

## 2024-05-30 ENCOUNTER — Ambulatory Visit: Attending: NEUROLOGY

## 2024-05-30 ENCOUNTER — Encounter (INDEPENDENT_AMBULATORY_CARE_PROVIDER_SITE_OTHER): Payer: Self-pay | Admitting: NEUROLOGY

## 2024-05-30 ENCOUNTER — Other Ambulatory Visit: Payer: Self-pay

## 2024-05-30 ENCOUNTER — Ambulatory Visit
Admission: RE | Admit: 2024-05-30 | Discharge: 2024-05-30 | Disposition: A | Discharge status: Home / Self Care | Attending: NEUROLOGY | Admitting: NEUROLOGY

## 2024-05-30 VITALS — BP 143/88 | HR 91 | Temp 99.1°F | Wt 173.4 lb

## 2024-05-30 DIAGNOSIS — G527 Disorders of multiple cranial nerves: Secondary | ICD-10-CM | POA: Insufficient documentation

## 2024-05-30 DIAGNOSIS — R202 Paresthesia of skin: Secondary | ICD-10-CM

## 2024-05-30 DIAGNOSIS — G500 Trigeminal neuralgia: Secondary | ICD-10-CM

## 2024-05-30 NOTE — Progress Notes (Signed)
 ASSESSMENT  TRIGEMINAL NEURALGIA:  She is a 68 year old woman who returns to clinic for over 2 years of progressive facial pain and numbness on the right side.  Symptoms started somewhat insidiously with frank numbness before becoming painful and tender to touch.  Her exam is notable for sensory deficits in V2 on the right side. We did obtain an MRI and MRA of the head which were essentially unremarkable. Nerve conduction studies revealed decrease amplitudes of the right trigeminal nerve compared to the left. She has experienced significant response to Trileptal  but has required high doses. She still has intermittent breakthrough symptoms and is also on gabapentin . This again has been helpful but incomplete. TID dosing has improved this. There has been some concern of medication side effect leading to dizziness which is certainly possible, however, she is overall happy with the balance of side effects to pain management.   2.   EXTREMITY PARESTHESIAS: She has a longer standing history of paresthesias in the arms and legs.  These can be painful at times.  Her exam was previously notable for mostly small fiber deficits in a patchy distribution in the lower extremities calling into question possible small fiber neuropathy which does have an association with fibromyalgia.  EMG revealed no evidence of a large fiber process. The addition of trileptal  and gabapentin  has helped her symptoms. Rheumatologic workup has been unremarkable.   3. MULTIPLE CRANIAL NEUROPATHIES: This has been new in the last 6 months. It appears to have been gradual in onset. She has frank binocular diplopia concerning for cranial nerve abnormality with difficulty particularly with down gaze and slightly to accomodation. There appears to be no fatigability. She also previously had mild asymmetry of the right lower face, however, this has significantly worsened so that right lower face is largely paralyzed with some mild weakness with right eye  closure with forehead sparing.  Repeat MRI and MG antibody panel was negative. MRV was completed which was likewise unrevealing.  She now has multiple clearly worsening cranial neuropathies with previous negative imaging and negative rheumatologic workup.  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  We will repeat an MRI of the brain and obtain a cervical spine image.  I will also obtain paraneoplastic panel.  If these are negative, I would strongly consider a lumbar puncture.    PLAN  1. Continue Trileptal   900 mg BID      Continue gabapentin   800 TID   2.  Continue to monitor       If worsening, could consider the utility of skin biopsy to confirm presence of SFN  3.   Obtain MRI brain and cervical spine with and without contrast        Obtain paraneoplastic panel through [facility]        We will consider lumbar puncture    Return to clinic following imaging    Thank you for allowing me to participate in your patient's care and please do not hesitate to contact me for any questions or concerns.    S. Zach Starla Deller, DO  Assistant Professor of Neurology  [place]  [facility]     I personally spent a total of 40 minutes today preparing to see the patient, in the encounter with the patient, and documenting after the visit.    [id number]: I will continue to be the provider focal point in managing  the chronic complex neurological condition  ==========================================================================================================================================    NAME:  Jo Wong  DOB:  1956/02/09  VISIT DATE:  06/17/2022    CC:  Facial Numbness    Patient seen in consultation at the request of Dr. Oneil Alvine  History obtained from the patient and chart/records  Age of patient:  68 y.o.    INTERVAL: Since last visit, she has had significant worsening of facial  weakness.  This has been gradual.  She now has very limited movement of the right lower face.  This area also feels swollen or numb.  She does have some increase in her trigeminal neuralgia pain. Although vision is improved, she still reports double vision when looking down or reading.    HPI:   I had the pleasure of seeing your patient in neurology clinic for an outpatient consultation, who is a 68 y.o. year old female who was referred for evaluation of facial numbness.  Please allow me to summarize the history for the record.     She states that symptoms began in November 2022.  It started with numbness that began under the right eye.  She did have a red spot in the same location at the time.  She does have a history of skin cancer and was given topical chemotherapeutic agent by her dermatologist to treat this.  Despite this, the numbness persisted and then began to extend down the right side of the face to the lip and laterally towards the ear.  Gradually it became painful and tender to touch.  She reports the even light stimulus now can cause pain on the right side of her face.  She denies any weakness.  She does report that her right eye seems to have had a very mild decline in visual acuity.  She denies any changes in taste.  She does have a previous history of headaches though nothing with similar symptoms.  She also reports more longstanding numbness and tingling in the arms and legs.  She does report a history of fibromyalgia.  She does have some lightheadedness with standing.  She does have issues with constipation and diarrhea as she has a history of IBS.    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    Headache, post-traumatic    Thyroid  nodule    Thrombosis of mesenteric vein (CMS HCC)     Thromboembolic disorder (CMS HCC)    Fibromyositis    Multiple nodules of lung    IBS (irritable bowel syndrome)    Insomnia    Acquired hypothyroidism    Mixed hyperlipidemia    Hypertension    Major depressive disorder    Prolapsed cervical intervertebral disc    Cyst of ovary    Fibromyalgia    Generalized anxiety disorder    Actinic keratosis    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    OSA on CPAP    TMJ (dislocation of temporomandibular joint)    Chronic cough    Major depression, recurrent (CMS HCC)     Past Surgical History:   Procedure Laterality Date    BILATERAL OOPHORECTOMY      COLONOSCOPY  2017    Dr mal    HX CHOLECYSTECTOMY      HX LASER SKIN LESION REMOVAL      01/31/22    HX THYROIDECTOMY      HX  TUBAL LIGATION      HX TUMOR REMOVAL  01/12/2024    throat    NECK SURGERY      mohs surgery x 3  under  chin  on left side    [place]  01/12/2024    SKIN GRAFT      12/2020  right upper arm    SKIN LESION EXCISION      removal of pigmented skin lesion    SQUAMOUS CELL CARCINOMA EXCISION      10/2019  left shoulder and right front shoulder 03/2021  back right         Family Medical History:       Problem Relation (Age of Onset)    Aortic Aneurysm Brother    Arthritis-rheumatoid Sister    Back Problems Brother    Blood Clots Mother    Breast Disease Sister    Cerebral Aneurysm Maternal Aunt    Deep vein thrombosis Sister    Heart Disease Mother    Hypertension (High Blood Pressure) Mother    Other Father    Primary Brain tumor Brother    Stroke Mother    Ulcers Brother            Current Outpatient Medications   Medication Sig Dispense Refill    albuterol  sulfate (PROVENTIL  HFA/VENTOLIN  HFA) 90 mcg Inhalation Inhaler Take 2 Puffs by inhalation Every 6 hours as needed 18 g 3    ARIPiprazole  (ABILIFY ) 5 mg Oral Tablet Take 1 Tablet (5 mg total) by mouth Once a day 90 Tablet 3    budesonide-glycopyr-formoterol (BREZTRI  AEROSPHERE) 160-9-4.8 mcg/actuation Inhalation HFA Aerosol Inhaler Take 2 Puffs by  inhalation Twice daily 3 Each 2    buPROPion  (WELLBUTRIN  XL) 300 mg extended release 24 hr tablet Take 1 Tablet (300 mg total) by mouth Once a day 90 Tablet 2    Cholecalciferol, Vitamin D3, 50 mcg (2,000 unit) Oral Capsule Take 1 Capsule by mouth Once a day      clotrimazole (MYCELEX) 10 mg Mucous Membrane Troche DISSOLVE 1 TABLET BY MOUTH 4 TIMES DAILY (AFTER MEALS AND AT BEDTIME)      famotidine  (PEPCID ) 40 mg Oral Tablet TAKE 1 TABLET EVERY EVENING 90 Tablet 3    gabapentin  (NEURONTIN ) 800 mg Oral Tablet Take 1 Tablet (800 mg total) by mouth Three times a day for 90 days 270 Tablet 0    levoFLOXacin  (LEVAQUIN ) 500 mg Oral Tablet Take 1.5 Tablets (750 mg total) by mouth Daily 10 Tablet 0    levothyroxine  (SYNTHROID) 50 mcg Oral Tablet TAKE 1 TABLET EVERY DAY 90 Tablet 3    losartan  (COZAAR ) 50 mg Oral Tablet TAKE 1 TABLET ONE TIME DAILY 90 Tablet 3    meclizine  (ANTIVERT ) 12.5 mg Oral Tablet Take 1 Tablet (12.5 mg total) by mouth Every 12 hours as needed 30 Tablet 0    mecobalamin, vitamin B12, 1,000 mcg Oral Tablet, Chewable Chew 1 Tablet (1,000 mcg total) Once a day      milnacipran  (SAVELLA ) 100 mg Oral Tablet Take 1 Tablet (100 mg total) by mouth Twice daily (Patient taking differently: Take 1 Tablet (100 mg total) by mouth Once a day) 180 Tablet 2    montelukast (SINGULAIR) 10 mg Oral Tablet Take 1 Tablet (10 mg total) by mouth Every evening      nitroGLYCERIN (NITROSTAT) 0.4 mg Sublingual Tablet, Sublingual Place 1 Tablet (0.4 mg total) under the tongue Every 5 minutes as needed for Chest pain for 3 doses  over 15 minutes      omega-3 fatty acid (LOVAZA) 1 gram Oral Capsule Take 1 Capsule (1 g total) by mouth Daily      OXcarbazepine  (TRILEPTAL ) 300 mg Oral Tablet Take 3 Tablets (900 mg total) by mouth Twice daily for 180 days 540 Tablet 1    pantoprazole  (PROTONIX ) 40 mg Oral Tablet, Delayed Release (E.C.) TAKE 1 TABLET EVERY DAY 90 Tablet 3    pravastatin  (PRAVACHOL ) 40 mg Oral Tablet Take 1 Tablet (40 mg  total) by mouth Every evening 90 Tablet 2    Triamcinolone  Acetonide (NASACORT ) 55 mcg Nasal Aerosol, Spray Administer 2 Sprays into affected nostril(s) Once a day 16.9 mL 5     No current facility-administered medications for this visit.     Allergies   Allergen Reactions    Doxycycline  Other Adverse Reaction (Add comment)     Sweats and flushing    vomiting    Duloxetine  Other Adverse Reaction (Add comment)     Unk.     Social History     Socioeconomic History    Marital status: Married     Spouse name: Not on file    Number of children: Not on file    Years of education: Not on file    Highest education level: Not on file   Occupational History    Not on file   Tobacco Use    Smoking status: Never    Smokeless tobacco: Never   Vaping Use    Vaping status: Never Used   Substance and Sexual Activity    Alcohol use: Never    Drug use: Never    Sexual activity: Not on file   Other Topics Concern    Not on file   Social History Narrative    Not on file     Social Determinants of Health     Financial Resource Strain: Low Risk (03/04/2022)    Financial Resource Strain     SDOH Financial: No   Transportation Needs: Low Risk (03/04/2022)    Transportation Needs     SDOH Transportation: No   Social Connections: Low Risk (03/04/2022)    Social Connections     SDOH Social Isolation: 5 or more times a week   Intimate Partner Violence: Low Risk (03/04/2022)    Intimate Partner Violence     SDOH Domestic Violence: No   Housing Stability: Low Risk (03/04/2022)    Housing Stability     SDOH Housing Situation: I have housing.     SDOH Housing Worry: No       ============================================================================================================================================  GENERAL EXAMINATION  BP (!) 143/88 (Site: Left Arm, Patient Position: Sitting)   Pulse 91   Temp 37.3 C (99.1 F) (Temporal)   Wt 78.7 kg (173 lb 6.4 oz)   SpO2 94%   BMI 29.76 kg/m   Vital signs personally reviewed  General: No  acute distress, alert  HEENT: Normocephalic, no scleral icterus  Extremities: No significant edema, No cyanosis    NEUROLOGIC EXAM  On neurological exam, patient was awake, alert and answering questions appropriately  Speech was fluent, without dysarthria or aphasia.    CN  II:  Visual fields intact to confrontation  III, IV, VI: reports frank horizontal diplopia with downgaze and accommodation  V:  Diminished in all distributions to light touch on the right  VII: right lower facial paralysis, mild weakness with eye closure, frontalis and platysma are spared  VIII: grossly intact  IX,  X: symmetric palatal elevation  XI: normal strength of trapezius and sternocleidomastoid bilaterally  XII: tongue midline with full movements    MOTOR  Bulk: normal  Abnormal Movements: none    Strength:     MRC Grading Scale   Right Left   Deltoid 5 5   Biceps 5 5   Triceps 5 5   Wrist Extension - -   Wrist Flexion - -   Finger Extension - -   Finger Abduction - -   Finger Flexion - -   Hip Flexion 5 5   Hip Extension - -   Hip Abduction - -   Hip Adduction - -   Knee Extension - -   Knee Flexion - -   Ankle Dorsiflexion - -   Ankle Plantarflexion - -   Toe Extension - -   Toe Flexion - -             REFLEXES   Right Left   Biceps 3 3   Triceps 2 2   Brachioradialis 2 2   Patellar 3 3   Achilles 1 1   Plantar - -   Hoffman - -   Pectoralis - -   Jaw Jerk - -       SENSORY  Light touch: intact throughout all extremities    GAIT  General: casual, normal gait    ================================================================================================================================LABS  Personal Review of prior labs is notable for:   2025  MG antibody panel negative  2024  CMP WNL   TSH normal  CBC largely WNL   2023  Hepatitis panel negative  Vitamin-D within normal limits   LDL 127   TSH within normal limits   CMP largely within normal limits   CBC largely within normal limits  SSA/B antibodies negative   Rheumatoid factor  negative   Anti CCP antibody negative   ANA negative   SPEP within normal limits   B12 greater than 1500   A1c 4.9  IMAGING  Personal Review of imaging is notable for:    MRV intracranial May 2025 - unremarkable  MRI Brain w/o contrast 07/17/2022:  Mild degree of atrophy likely in keeping with age otherwise essentially unremarkable study   MRA Head July 17, 2022:  No vascular compression of the trigeminal nerve on the right  OTHER DIAGNOSTICS  Personal Review of other prior diagnostics is notable for:  Not applicable

## 2024-06-07 DIAGNOSIS — G527 Disorders of multiple cranial nerves: Secondary | ICD-10-CM | POA: Insufficient documentation

## 2024-06-15 LAB — PARANEOPLASTIC AB EVAL, SERUM
AMPHIPHYSIN AB, S: NEGATIVE
ANNA-2, S: NEGATIVE
ANTI-GLIAL NUCLEAR AB, TYPE 1: NEGATIVE
ANTI-NEURONAL NUCLEAR AB, TYPE 1: NEGATIVE
ANTI-NEURONAL NUCLEAR AB, TYPE 3: NEGATIVE
CRMP-5-IGG, S: NEGATIVE
PURKINJE CELL CYTOPLASMIC AB TYPE 1: NEGATIVE
PURKINJE CELL CYTOPLASMIC AB TYPE 2: NEGATIVE
PURKINJE CELL CYTOPLASMIC AB TYPE TR: NEGATIVE

## 2024-07-03 ENCOUNTER — Other Ambulatory Visit: Payer: Self-pay

## 2024-07-03 ENCOUNTER — Ambulatory Visit: Admission: RE | Admit: 2024-07-03 | Discharge: 2024-07-03 | Payer: Self-pay | Attending: NEUROLOGY

## 2024-07-03 ENCOUNTER — Ambulatory Visit (HOSPITAL_COMMUNITY)
Admission: RE | Admit: 2024-07-03 | Discharge: 2024-07-03 | Disposition: A | Discharge status: Home / Self Care | Payer: Self-pay | Source: Ambulatory Visit | Attending: NEUROLOGY | Admitting: NEUROLOGY

## 2024-07-03 DIAGNOSIS — G527 Disorders of multiple cranial nerves: Secondary | ICD-10-CM

## 2024-07-03 MED ORDER — GADOBUTROL 10 MMOL/10 ML (1 MMOL/ML) INTRAVENOUS SOLUTION
10.0000 mL | INTRAVENOUS | Status: AC
Start: 2024-07-03 — End: 2024-07-03
  Administered 2024-07-03: 7 mL via INTRAVENOUS

## 2024-07-04 ENCOUNTER — Encounter (INDEPENDENT_AMBULATORY_CARE_PROVIDER_SITE_OTHER): Payer: Self-pay | Admitting: NEUROLOGY

## 2024-07-04 DIAGNOSIS — R9082 White matter disease, unspecified: Secondary | ICD-10-CM

## 2024-07-04 DIAGNOSIS — Z85828 Personal history of other malignant neoplasm of skin: Secondary | ICD-10-CM

## 2024-07-04 DIAGNOSIS — G319 Degenerative disease of nervous system, unspecified: Secondary | ICD-10-CM

## 2024-07-04 DIAGNOSIS — M4802 Spinal stenosis, cervical region: Secondary | ICD-10-CM

## 2024-07-07 ENCOUNTER — Other Ambulatory Visit: Payer: Self-pay

## 2024-07-07 ENCOUNTER — Encounter (INDEPENDENT_AMBULATORY_CARE_PROVIDER_SITE_OTHER): Payer: Self-pay | Admitting: NEUROLOGY

## 2024-07-07 ENCOUNTER — Ambulatory Visit (INDEPENDENT_AMBULATORY_CARE_PROVIDER_SITE_OTHER): Admitting: NEUROLOGY

## 2024-07-07 ENCOUNTER — Encounter (INDEPENDENT_AMBULATORY_CARE_PROVIDER_SITE_OTHER): Admitting: NEUROLOGY

## 2024-07-07 VITALS — BP 140/86 | HR 91 | Temp 97.8°F | Wt 172.8 lb

## 2024-07-07 DIAGNOSIS — M4802 Spinal stenosis, cervical region: Secondary | ICD-10-CM

## 2024-07-07 DIAGNOSIS — G527 Disorders of multiple cranial nerves: Secondary | ICD-10-CM

## 2024-07-07 NOTE — Progress Notes (Deleted)
 ASSESSMENT  TRIGEMINAL NEURALGIA:  She is a 68 year old woman who returns to clinic for over 2 years of progressive facial pain and numbness on the right side.  Symptoms started somewhat insidiously with frank numbness before becoming painful and tender to touch.  Her exam is notable for sensory deficits in V2 on the right side. We did obtain an MRI and MRA of the head which were essentially unremarkable. Nerve conduction studies revealed decrease amplitudes of the right trigeminal nerve compared to the left. She has experienced significant response to Trileptal  but has required high doses. She still has intermittent breakthrough symptoms and is also on gabapentin . This again has been helpful but incomplete. TID dosing has improved this. There has been some concern of medication side effect leading to dizziness which is certainly possible, however, she is overall happy with the balance of side effects to pain management.   2.   EXTREMITY PARESTHESIAS: She has a longer standing history of paresthesias in the arms and legs.  These can be painful at times.  Her exam was previously notable for mostly small fiber deficits in a patchy distribution in the lower extremities calling into question possible small fiber neuropathy which does have an association with fibromyalgia.  EMG revealed no evidence of a large fiber process. The addition of trileptal  and gabapentin  has helped her symptoms. Rheumatologic workup has been unremarkable.   3. MULTIPLE CRANIAL NEUROPATHIES: This has been new in the last 6 months. It appears to have been gradual in onset. She has frank binocular diplopia concerning for cranial nerve abnormality with difficulty particularly with down gaze and slightly to accomodation. There appears to be no fatigability. She also previously had mild asymmetry of the right lower face, however, this has significantly worsened so that right lower face is largely paralyzed with some mild weakness with right eye  closure with forehead sparing.  Repeat MRI and MG antibody panel was negative. MRV was completed which was likewise unrevealing.  She now has multiple clearly worsening cranial neuropathies with previous negative imaging and negative rheumatologic workup.  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  We will repeat an MRI of the brain and obtain a cervical spine image.  I will also obtain paraneoplastic panel.  If these are negative, I would strongly consider a lumbar puncture.    PLAN  1. Continue Trileptal   900 mg BID      Continue gabapentin   800 TID   2.  Continue to monitor       If worsening, could consider the utility of skin biopsy to confirm presence of SFN  3.   Obtain MRI brain and cervical spine with and without contrast        Obtain paraneoplastic panel through [facility]        We will consider lumbar puncture    Return to clinic following imaging    Thank you for allowing me to participate in your patient's care and please do not hesitate to contact me for any questions or concerns.    S. Zach Deisy Ozbun, DO  Assistant Professor of Neurology  [place]  [facility]     I personally spent a total of 40 minutes today preparing to see the patient, in the encounter with the patient, and documenting after the visit.    [id number]: I will continue to be the provider focal point in managing  the chronic complex neurological condition  ==========================================================================================================================================    NAME:  Jo Wong  DOB:  19-Apr-1956  VISIT DATE:  06/17/2022    CC:  Facial Numbness    Patient seen in consultation at the request of Dr. Oneil Alvine  History obtained from the patient and chart/records  Age of patient:  68 y.o.    INTERVAL: Since last visit, she has had significant worsening of facial  weakness.  This has been gradual.  She now has very limited movement of the right lower face.  This area also feels swollen or numb.  She does have some increase in her trigeminal neuralgia pain. Although vision is improved, she still reports double vision when looking down or reading.    HPI:   I had the pleasure of seeing your patient in neurology clinic for an outpatient consultation, who is a 68 y.o. year old female who was referred for evaluation of facial numbness.  Please allow me to summarize the history for the record.     She states that symptoms began in November 2022.  It started with numbness that began under the right eye.  She did have a red spot in the same location at the time.  She does have a history of skin cancer and was given topical chemotherapeutic agent by her dermatologist to treat this.  Despite this, the numbness persisted and then began to extend down the right side of the face to the lip and laterally towards the ear.  Gradually it became painful and tender to touch.  She reports the even light stimulus now can cause pain on the right side of her face.  She denies any weakness.  She does report that her right eye seems to have had a very mild decline in visual acuity.  She denies any changes in taste.  She does have a previous history of headaches though nothing with similar symptoms.  She also reports more longstanding numbness and tingling in the arms and legs.  She does report a history of fibromyalgia.  She does have some lightheadedness with standing.  She does have issues with constipation and diarrhea as she has a history of IBS.    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    Headache, post-traumatic    Thyroid  nodule    Thrombosis of mesenteric vein (CMS HCC)     Thromboembolic disorder (CMS HCC)    Fibromyositis    Multiple nodules of lung    IBS (irritable bowel syndrome)    Insomnia    Acquired hypothyroidism    Mixed hyperlipidemia    Hypertension    Major depressive disorder    Prolapsed cervical intervertebral disc    Cyst of ovary    Fibromyalgia    Generalized anxiety disorder    Actinic keratosis    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    OSA on CPAP    TMJ (dislocation of temporomandibular joint)    Chronic cough    Major depression, recurrent (CMS HCC)     Past Surgical History:   Procedure Laterality Date    BILATERAL OOPHORECTOMY      COLONOSCOPY  2017    Dr mal    HX CHOLECYSTECTOMY      HX LASER SKIN LESION REMOVAL      01/31/22    HX THYROIDECTOMY      HX  TUBAL LIGATION      HX TUMOR REMOVAL  01/12/2024    throat    NECK SURGERY      [facility] surgery x 3  under  chin  on left side    [place] [facility]  01/12/2024    SKIN GRAFT      12/2020  right upper arm    SKIN LESION EXCISION      removal of pigmented skin lesion    SQUAMOUS CELL CARCINOMA EXCISION      10/2019  left shoulder and right front shoulder 03/2021  back right         Family Medical History:       Problem Relation (Age of Onset)    Aortic Aneurysm Brother    Arthritis-rheumatoid Sister    Back Problems Brother    Blood Clots Mother    Breast Disease Sister    Cerebral Aneurysm Maternal Aunt    Deep vein thrombosis Sister    Heart Disease Mother    Hypertension (High Blood Pressure) Mother    Other Father    Primary Brain tumor Brother    Stroke Mother    Ulcers Brother            Current Outpatient Medications   Medication Sig Dispense Refill    albuterol  sulfate (PROVENTIL  HFA/VENTOLIN  HFA) 90 mcg Inhalation Inhaler Take 2 Puffs by inhalation Every 6 hours as needed 18 g 3    ARIPiprazole  (ABILIFY ) 5 mg Oral Tablet Take 1 Tablet (5 mg total) by mouth Once a day 90 Tablet 3    budesonide-glycopyr-formoterol (BREZTRI  AEROSPHERE) 160-9-4.8 mcg/actuation Inhalation HFA Aerosol Inhaler Take 2 Puffs by  inhalation Twice daily 3 Each 2    buPROPion  (WELLBUTRIN  XL) 300 mg extended release 24 hr tablet Take 1 Tablet (300 mg total) by mouth Once a day 90 Tablet 2    Cholecalciferol, Vitamin D3, 50 mcg (2,000 unit) Oral Capsule Take 1 Capsule by mouth Once a day      clotrimazole (MYCELEX) 10 mg Mucous Membrane Troche DISSOLVE 1 TABLET BY MOUTH 4 TIMES DAILY (AFTER MEALS AND AT BEDTIME)      famotidine  (PEPCID ) 40 mg Oral Tablet TAKE 1 TABLET EVERY EVENING 90 Tablet 3    gabapentin  (NEURONTIN ) 800 mg Oral Tablet Take 1 Tablet (800 mg total) by mouth Three times a day for 90 days 270 Tablet 0    levothyroxine  (SYNTHROID) 50 mcg Oral Tablet TAKE 1 TABLET EVERY DAY 90 Tablet 3    losartan  (COZAAR ) 50 mg Oral Tablet TAKE 1 TABLET ONE TIME DAILY 90 Tablet 3    meclizine  (ANTIVERT ) 12.5 mg Oral Tablet Take 1 Tablet (12.5 mg total) by mouth Every 12 hours as needed 30 Tablet 0    mecobalamin, vitamin B12, 1,000 mcg Oral Tablet, Chewable Chew 1 Tablet (1,000 mcg total) Once a day      milnacipran  (SAVELLA ) 100 mg Oral Tablet Take 1 Tablet (100 mg total) by mouth Twice daily (Patient taking differently: Take 1 Tablet (100 mg total) by mouth Once a day) 180 Tablet 2    montelukast (SINGULAIR) 10 mg Oral Tablet Take 1 Tablet (10 mg total) by mouth Every evening      nitroGLYCERIN (NITROSTAT) 0.4 mg Sublingual Tablet, Sublingual Place 1 Tablet (0.4 mg total) under the tongue Every 5 minutes as needed for Chest pain for 3 doses over 15 minutes      omega-3 fatty acid (LOVAZA) 1 gram Oral Capsule Take 1 Capsule (1 g  total) by mouth Daily      OXcarbazepine  (TRILEPTAL ) 300 mg Oral Tablet Take 3 Tablets (900 mg total) by mouth Twice daily for 180 days 540 Tablet 1    pantoprazole  (PROTONIX ) 40 mg Oral Tablet, Delayed Release (E.C.) TAKE 1 TABLET EVERY DAY 90 Tablet 3    pravastatin  (PRAVACHOL ) 40 mg Oral Tablet Take 1 Tablet (40 mg total) by mouth Every evening 90 Tablet 2    Triamcinolone  Acetonide (NASACORT ) 55 mcg Nasal Aerosol,  Spray Administer 2 Sprays into affected nostril(s) Once a day 16.9 mL 5     No current facility-administered medications for this visit.     Allergies   Allergen Reactions    Doxycycline  Other Adverse Reaction (Add comment)     Sweats and flushing    vomiting    Duloxetine  Other Adverse Reaction (Add comment)     Unk.     Social History     Socioeconomic History    Marital status: Married     Spouse name: Not on file    Number of children: Not on file    Years of education: Not on file    Highest education level: Not on file   Occupational History    Not on file   Tobacco Use    Smoking status: Never    Smokeless tobacco: Never   Vaping Use    Vaping status: Never Used   Substance and Sexual Activity    Alcohol use: Never    Drug use: Never    Sexual activity: Not on file   Other Topics Concern    Not on file   Social History Narrative    Not on file     Social Determinants of Health     Financial Resource Strain: Low Risk (03/04/2022)    Financial Resource Strain     SDOH Financial: No   Transportation Needs: Low Risk (03/04/2022)    Transportation Needs     SDOH Transportation: No   Social Connections: Low Risk (03/04/2022)    Social Connections     SDOH Social Isolation: 5 or more times a week   Intimate Partner Violence: Low Risk (03/04/2022)    Intimate Partner Violence     SDOH Domestic Violence: No   Housing Stability: Low Risk (03/04/2022)    Housing Stability     SDOH Housing Situation: I have housing.     SDOH Housing Worry: No       ============================================================================================================================================  GENERAL EXAMINATION  There were no vitals taken for this visit.  Vital signs personally reviewed  General: No acute distress, alert  HEENT: Normocephalic, no scleral icterus  Extremities: No significant edema, No cyanosis    NEUROLOGIC EXAM  On neurological exam, patient was awake, alert and answering questions appropriately  Speech was  fluent, without dysarthria or aphasia.    CN  II:  Visual fields intact to confrontation  III, IV, VI: reports frank horizontal diplopia with downgaze and accommodation  V:  Diminished in all distributions to light touch on the right  VII: right lower facial paralysis, mild weakness with eye closure, frontalis and platysma are spared  VIII: grossly intact  IX, X: symmetric palatal elevation  XI: normal strength of trapezius and sternocleidomastoid bilaterally  XII: tongue midline with full movements    MOTOR  Bulk: normal  Abnormal Movements: none    Strength:     MRC Grading Scale   Right Left   Deltoid 5 5  Biceps 5 5   Triceps 5 5   Wrist Extension - -   Wrist Flexion - -   Finger Extension - -   Finger Abduction - -   Finger Flexion - -   Hip Flexion 5 5   Hip Extension - -   Hip Abduction - -   Hip Adduction - -   Knee Extension - -   Knee Flexion - -   Ankle Dorsiflexion - -   Ankle Plantarflexion - -   Toe Extension - -   Toe Flexion - -             REFLEXES   Right Left   Biceps 3 3   Triceps 2 2   Brachioradialis 2 2   Patellar 3 3   Achilles 1 1   Plantar - -   Hoffman - -   Pectoralis - -   Jaw Jerk - -       SENSORY  Light touch: intact throughout all extremities    GAIT  General: casual, normal gait    ================================================================================================================================LABS  Personal Review of prior labs is notable for:   2025  MG antibody panel negative  2024  CMP WNL   TSH normal  CBC largely WNL   2023  Hepatitis panel negative  Vitamin-D within normal limits   LDL 127   TSH within normal limits   CMP largely within normal limits   CBC largely within normal limits  SSA/B antibodies negative   Rheumatoid factor negative   Anti CCP antibody negative   ANA negative   SPEP within normal limits   B12 greater than 1500   A1c 4.9  IMAGING  Personal Review of imaging is notable for:    MRV intracranial May 2025 - unremarkable  MRI Brain w/o  contrast 07/17/2022:  Mild degree of atrophy likely in keeping with age otherwise essentially unremarkable study   MRA Head July 17, 2022:  No vascular compression of the trigeminal nerve on the right  OTHER DIAGNOSTICS  Personal Review of other prior diagnostics is notable for:  Not applicable

## 2024-07-07 NOTE — Progress Notes (Signed)
 ASSESSMENT  TRIGEMINAL NEURALGIA:  She is a 68 year old woman who returns to clinic for over 2 years of progressive facial pain and numbness on the right side.  Symptoms started somewhat insidiously with frank numbness before becoming painful and tender to touch.  Her exam is notable for sensory deficits in V2 on the right side. We did obtain an MRI and MRA of the head which were essentially unremarkable. Nerve conduction studies revealed decrease amplitudes of the right trigeminal nerve compared to the left. She has experienced significant response to Trileptal  but has required high doses. She still has intermittent breakthrough symptoms and is also on gabapentin . This again has been helpful but incomplete. TID dosing has improved this. There has been some concern of medication side effect leading to dizziness which is certainly possible, however, she is overall happy with the balance of side effects to pain management.   2.   CERVICAL SPINAL STENOSIS: She has a longer standing history of paresthesias in the arms and legs.  These can be painful at times.  Her exam was previously notable for mostly small fiber deficits in a patchy distribution in the lower extremities calling into question possible small fiber neuropathy which does have an association with fibromyalgia.  EMG revealed no evidence of a large fiber process. The addition of trileptal  and gabapentin  has helped her symptoms. We then obtained MRI of the cervical spine which showed severe canal stenosis which may be driving her symptoms. We discussed this in detail as well as symptoms that should prompt urgent evaluation. For now she would like to defer discussion with surgeon.   3. MULTIPLE CRANIAL NEUROPATHIES: This has been new in the last 6-8 months. It appears to have been gradual in onset. She has frank binocular diplopia concerning for cranial nerve abnormality with difficulty particularly with down gaze and slightly to accomodation. There appears to  be no fatigability. She also previously had mild asymmetry of the right lower face, however, this has significantly worsened so that right lower face is largely paralyzed with some mild weakness with right eye closure with forehead sparing.  Repeat MRI and MG antibody panel was negative. MRV was completed which was likewise unrevealing.  She now has multiple clearly worsening cranial neuropathies with previous negative imaging and negative rheumatologic workup.  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  Repeat MRI of the brain and cervical spine was unrevealing as was paraneoplastic panel. We will pursue lumbar puncture at this time.     PLAN  1. Continue Trileptal   900 mg BID      Continue gabapentin   800 TID   2.  Continue to monitor       If worsening, will refer to NSGY  3.   Obtain lumbar puncture        CSF labs to include cell count, protein, glucose, MBP, OCBs and flow cytometry         If negative, would consider referral to neuroimmunology     Return to clinic following imaging    Thank you for allowing me to participate in your patient's care and please do not hesitate to contact me for any questions or concerns.    S. Zach Abdulah Iqbal, DO  Assistant Professor of Neurology  [facility]  [facility]     I personally spent a total of 40 minutes today preparing to see the patient,  in the encounter with the patient, and documenting after the visit.    [id number]: I will continue to be the provider focal point in managing the chronic complex neurological condition  ==========================================================================================================================================    NAME:  Jo Wong  DOB:  September 11, 1956  VISIT DATE:  06/17/2022    CC:  Facial Numbness    Patient seen in consultation at the request of Jo Wong  History obtained  from the patient and chart/records  Age of patient:  68 y.o.    INTERVAL: Since last visit, symptoms are largely unchanged. Still experiencing frequent diplopia. Right facial droop still quite prominent. She has not noted any new deficits since last seen.     HPI:   I had the pleasure of seeing your patient in neurology clinic for an outpatient consultation, who is a 68 y.o. year old female who was referred for evaluation of facial numbness.  Please allow me to summarize the history for the record.     She states that symptoms began in November 2022.  It started with numbness that began under the right eye.  She did have a red spot in the same location at the time.  She does have a history of skin cancer and was given topical chemotherapeutic agent by her dermatologist to treat this.  Despite this, the numbness persisted and then began to extend down the right side of the face to the lip and laterally towards the ear.  Gradually it became painful and tender to touch.  She reports the even light stimulus now can cause pain on the right side of her face.  She denies any weakness.  She does report that her right eye seems to have had a very mild decline in visual acuity.  She denies any changes in taste.  She does have a previous history of headaches though nothing with similar symptoms.  She also reports more longstanding numbness and tingling in the arms and legs.  She does report a history of fibromyalgia.  She does have some lightheadedness with standing.  She does have issues with constipation and diarrhea as she has a history of IBS.    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    Headache, post-traumatic    Thyroid  nodule    Thrombosis of mesenteric vein (CMS HCC)    Thromboembolic disorder (CMS  HCC)    Fibromyositis    Multiple nodules of lung    IBS (irritable bowel syndrome)    Insomnia    Acquired hypothyroidism    Mixed hyperlipidemia    Hypertension    Major depressive disorder    Prolapsed cervical intervertebral disc    Cyst of ovary    Fibromyalgia    Generalized anxiety disorder    Actinic keratosis    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    OSA on CPAP    TMJ (dislocation of temporomandibular joint)    Chronic cough    Major depression, recurrent (CMS HCC)     Past Surgical History:   Procedure Laterality Date    BILATERAL OOPHORECTOMY      COLONOSCOPY  2017    Dr mal    HX CHOLECYSTECTOMY      HX LASER SKIN LESION REMOVAL      01/31/22    HX THYROIDECTOMY      HX TUBAL LIGATION  HX TUMOR REMOVAL  01/12/2024    throat    NECK SURGERY      mohs surgery x 3  under  chin  on left side    [place]  01/12/2024    SKIN GRAFT      12/2020  right upper arm    SKIN LESION EXCISION      removal of pigmented skin lesion    SQUAMOUS CELL CARCINOMA EXCISION      10/2019  left shoulder and right front shoulder 03/2021  back right         Family Medical History:       Problem Relation (Age of Onset)    Aortic Aneurysm Brother    Arthritis-rheumatoid Sister    Back Problems Brother    Blood Clots Mother    Breast Disease Sister    Cerebral Aneurysm Maternal Aunt    Deep vein thrombosis Sister    Heart Disease Mother    Hypertension (High Blood Pressure) Mother    Other Father    Primary Brain tumor Brother    Stroke Mother    Ulcers Brother            Current Outpatient Medications   Medication Sig Dispense Refill    albuterol  sulfate (PROVENTIL  HFA/VENTOLIN  HFA) 90 mcg Inhalation Inhaler Take 2 Puffs by inhalation Every 6 hours as needed 18 g 3    ARIPiprazole  (ABILIFY ) 5 mg Oral Tablet Take 1 Tablet (5 mg total) by mouth Once a day 90 Tablet 3    budesonide-glycopyr-formoterol (BREZTRI  AEROSPHERE) 160-9-4.8 mcg/actuation Inhalation HFA Aerosol Inhaler Take 2 Puffs by inhalation Twice daily 3 Each 2     buPROPion  (WELLBUTRIN  XL) 300 mg extended release 24 hr tablet Take 1 Tablet (300 mg total) by mouth Once a day 90 Tablet 2    Cholecalciferol, Vitamin D3, 50 mcg (2,000 unit) Oral Capsule Take 1 Capsule by mouth Once a day      clotrimazole (MYCELEX) 10 mg Mucous Membrane Troche DISSOLVE 1 TABLET BY MOUTH 4 TIMES DAILY (AFTER MEALS AND AT BEDTIME)      famotidine  (PEPCID ) 40 mg Oral Tablet TAKE 1 TABLET EVERY EVENING 90 Tablet 3    gabapentin  (NEURONTIN ) 800 mg Oral Tablet Take 1 Tablet (800 mg total) by mouth Three times a day for 90 days 270 Tablet 0    levothyroxine  (SYNTHROID) 50 mcg Oral Tablet TAKE 1 TABLET EVERY DAY 90 Tablet 3    losartan  (COZAAR ) 50 mg Oral Tablet TAKE 1 TABLET ONE TIME DAILY 90 Tablet 3    meclizine  (ANTIVERT ) 12.5 mg Oral Tablet Take 1 Tablet (12.5 mg total) by mouth Every 12 hours as needed 30 Tablet 0    mecobalamin, vitamin B12, 1,000 mcg Oral Tablet, Chewable Chew 1 Tablet (1,000 mcg total) Once a day      milnacipran  (SAVELLA ) 100 mg Oral Tablet Take 1 Tablet (100 mg total) by mouth Twice daily (Patient taking differently: Take 1 Tablet (100 mg total) by mouth Once a day) 180 Tablet 2    montelukast (SINGULAIR) 10 mg Oral Tablet Take 1 Tablet (10 mg total) by mouth Every evening      nitroGLYCERIN (NITROSTAT) 0.4 mg Sublingual Tablet, Sublingual Place 1 Tablet (0.4 mg total) under the tongue Every 5 minutes as needed for Chest pain for 3 doses over 15 minutes      omega-3 fatty acid (LOVAZA) 1 gram Oral Capsule Take 1 Capsule (1 g total) by mouth Daily  OXcarbazepine  (TRILEPTAL ) 300 mg Oral Tablet Take 3 Tablets (900 mg total) by mouth Twice daily for 180 days 540 Tablet 1    pantoprazole  (PROTONIX ) 40 mg Oral Tablet, Delayed Release (E.C.) TAKE 1 TABLET EVERY DAY 90 Tablet 3    pravastatin  (PRAVACHOL ) 40 mg Oral Tablet Take 1 Tablet (40 mg total) by mouth Every evening 90 Tablet 2    Triamcinolone  Acetonide (NASACORT ) 55 mcg Nasal Aerosol, Spray Administer 2 Sprays into  affected nostril(s) Once a day 16.9 mL 5     No current facility-administered medications for this visit.     Allergies   Allergen Reactions    Doxycycline  Other Adverse Reaction (Add comment)     Sweats and flushing    vomiting    Duloxetine  Other Adverse Reaction (Add comment)     Unk.     Social History     Socioeconomic History    Marital status: Married     Spouse name: Not on file    Number of children: Not on file    Years of education: Not on file    Highest education level: Not on file   Occupational History    Not on file   Tobacco Use    Smoking status: Never    Smokeless tobacco: Never   Vaping Use    Vaping status: Never Used   Substance and Sexual Activity    Alcohol use: Never    Drug use: Never    Sexual activity: Not on file   Other Topics Concern    Not on file   Social History Narrative    Not on file     Social Determinants of Health     Financial Resource Strain: Low Risk (03/04/2022)    Financial Resource Strain     SDOH Financial: No   Transportation Needs: Low Risk (03/04/2022)    Transportation Needs     SDOH Transportation: No   Social Connections: Low Risk (03/04/2022)    Social Connections     SDOH Social Isolation: 5 or more times a week   Intimate Partner Violence: Low Risk (03/04/2022)    Intimate Partner Violence     SDOH Domestic Violence: No   Housing Stability: Low Risk (03/04/2022)    Housing Stability     SDOH Housing Situation: I have housing.     SDOH Housing Worry: No       ============================================================================================================================================  GENERAL EXAMINATION  BP (!) 140/86 (Site: Left Arm, Patient Position: Sitting)   Pulse 91   Temp 36.6 C (97.8 F) (Temporal)   Wt 78.4 kg (172 lb 12.8 oz)   SpO2 93%   BMI 29.66 kg/m   Vital signs personally reviewed  General: No acute distress, alert  HEENT: Normocephalic, no scleral icterus  Extremities: No significant edema, No cyanosis    NEUROLOGIC EXAM  On  neurological exam, patient was awake, alert and answering questions appropriately  Speech was fluent, without dysarthria or aphasia.    CN  II:  Visual fields intact to confrontation  III, IV, VI: reports frank horizontal diplopia with upgaze, downgaze and accommodation  V:  Diminished in all distributions to light touch on the right  VII: right lower facial paralysis, mild weakness with eye closure, frontalis and platysma appear to spared  VIII: grossly intact  IX, X: symmetric palatal elevation  XI: normal strength of trapezius and sternocleidomastoid bilaterally  XII: tongue midline with full movements    MOTOR  Bulk: normal  Abnormal Movements: none    Strength:     MRC Grading Scale   Right Left   Deltoid 5 5   Biceps 5 5   Triceps 5 5   Wrist Extension - -   Wrist Flexion - -   Finger Extension - -   Finger Abduction - -   Finger Flexion - -   Hip Flexion 5 5   Hip Extension - -   Hip Abduction - -   Hip Adduction - -   Knee Extension - -   Knee Flexion - -   Ankle Dorsiflexion - -   Ankle Plantarflexion - -   Toe Extension - -   Toe Flexion - -             REFLEXES   Right Left   Biceps 3 3   Triceps 2 2   Brachioradialis 2 2   Patellar 3 3   Achilles 1 1   Plantar - -   Hoffman - -   Pectoralis - -   Jaw Jerk - -       SENSORY  Light touch: intact throughout all extremities    GAIT  General: casual, normal gait    ================================================================================================================================LABS  Personal Review of prior labs is notable for:   2025  Paraneoplastic panel negative  MG antibody panel negative  2024  CMP WNL   TSH normal  CBC largely WNL   2023  Hepatitis panel negative  Vitamin-D within normal limits   LDL 127   TSH within normal limits   CMP largely within normal limits   CBC largely within normal limits  SSA/B antibodies negative   Rheumatoid factor negative   Anti CCP antibody negative   ANA negative   SPEP within normal limits   B12 greater  than 1500   A1c 4.9  IMAGING  Personal Review of imaging is notable for:    MRI Brain and Cervical Spine October 2025 without clear etiology for symptoms  MRV intracranial May 2025 - unremarkable  MRI Brain w/o contrast 07/17/2022:  Mild degree of atrophy likely in keeping with age otherwise essentially unremarkable study   MRA Head July 17, 2022:  No vascular compression of the trigeminal nerve on the right  OTHER DIAGNOSTICS  Personal Review of other prior diagnostics is notable for:  Not applicable

## 2024-07-13 ENCOUNTER — Telehealth (INDEPENDENT_AMBULATORY_CARE_PROVIDER_SITE_OTHER): Payer: Self-pay | Admitting: NEUROLOGY

## 2024-07-13 NOTE — Telephone Encounter (Signed)
 Called patient and left a voicemail letting her know that her lumbar puncture is scheduled on 10/28 at 9:00 and they want her to arrive by 7:30. Told her nothing by mouth after midnight and that she will need a driver.Told her to call the office back with any questions.

## 2024-07-19 ENCOUNTER — Ambulatory Visit
Admission: RE | Admit: 2024-07-19 | Discharge: 2024-07-19 | Disposition: A | Discharge status: Home / Self Care | Payer: Self-pay | Source: Ambulatory Visit | Attending: NEUROLOGY | Admitting: NEUROLOGY

## 2024-07-19 ENCOUNTER — Telehealth (INDEPENDENT_AMBULATORY_CARE_PROVIDER_SITE_OTHER): Payer: Self-pay | Admitting: NEUROLOGY

## 2024-07-19 ENCOUNTER — Encounter (HOSPITAL_COMMUNITY): Payer: Self-pay

## 2024-07-19 ENCOUNTER — Other Ambulatory Visit: Payer: Self-pay

## 2024-07-19 ENCOUNTER — Telehealth (HOSPITAL_COMMUNITY): Payer: Self-pay | Admitting: Vascular & Interventional Radiology

## 2024-07-19 ENCOUNTER — Other Ambulatory Visit (INDEPENDENT_AMBULATORY_CARE_PROVIDER_SITE_OTHER): Payer: Self-pay | Admitting: Internal Medicine

## 2024-07-19 DIAGNOSIS — G527 Disorders of multiple cranial nerves: Secondary | ICD-10-CM | POA: Insufficient documentation

## 2024-07-19 DIAGNOSIS — G629 Polyneuropathy, unspecified: Secondary | ICD-10-CM

## 2024-07-19 LAB — GLUCOSE CSF: GLUCOSE CSF: 67 mg/dL (ref 40–70)

## 2024-07-19 LAB — BODY FLUID CELL COUNT WITH DIFFERENTIAL
BODY FLUID VOLUME: 1.5 mL
NUCLEATED CELLS, FLUID: 10 /uL (ref <=5)
RBC COUNT: 161569 /uL

## 2024-07-19 LAB — BODY FLUID CSF MAN DIFF
LYMPHOCYTE %: 30 %
NEUTROPHIL %: 70 %

## 2024-07-19 LAB — PROTEIN CSF: PROTEIN CSF: >200 mg/dL — ABNORMAL HIGH (ref 15–45)

## 2024-07-19 MED ORDER — FENTANYL (PF) 50 MCG/ML INJECTION SOLUTION
INTRAMUSCULAR | Status: AC
Start: 2024-07-19 — End: 2024-07-19
  Filled 2024-07-19: qty 2

## 2024-07-19 MED ORDER — MIDAZOLAM 5 MG/ML INJECTION WRAPPER
Freq: Once | INTRAMUSCULAR | Status: AC | PRN
Start: 2024-07-19 — End: 2024-07-19
  Administered 2024-07-19: 2 mg via INTRAVENOUS
  Administered 2024-07-19: 1 mg via INTRAVENOUS

## 2024-07-19 MED ORDER — SODIUM CHLORIDE 0.9 % INTRAVENOUS SOLUTION
INTRAVENOUS | Status: DC
Start: 2024-07-19 — End: 2024-07-20

## 2024-07-19 MED ORDER — FENTANYL (PF) 50 MCG/ML INJECTION WRAPPER
INJECTION | Freq: Once | INTRAMUSCULAR | Status: AC | PRN
Start: 2024-07-19 — End: 2024-07-19
  Administered 2024-07-19 (×2): 25 ug via INTRAVENOUS

## 2024-07-19 MED ORDER — MIDAZOLAM 5 MG/ML INJECTION WRAPPER
INTRAMUSCULAR | Status: AC
Start: 2024-07-19 — End: 2024-07-19
  Filled 2024-07-19: qty 1

## 2024-07-19 MED ORDER — LIDOCAINE HCL 20 MG/ML (2 %) INJECTION SOLUTION
INTRAMUSCULAR | Status: AC
Start: 2024-07-19 — End: 2024-07-19
  Filled 2024-07-19: qty 20

## 2024-07-19 MED ORDER — BREZTRI AEROSPHERE 160 MCG-9MCG-4.8MCG/ACTUATION HFA AEROSOL INHALER
2.0000 | INHALATION_SPRAY | Freq: Two times a day (BID) | RESPIRATORY_TRACT | 5 refills | Status: AC
Start: 2024-07-19 — End: ?

## 2024-07-19 MED ORDER — OXYCODONE-ACETAMINOPHEN 5 MG-325 MG TABLET
1.0000 | ORAL_TABLET | Freq: Once | ORAL | Status: DC | PRN
Start: 2024-07-19 — End: 2024-07-20

## 2024-07-19 NOTE — OR PreOp (Signed)
 Attempted to call pt regarding IR procedure. No answer.

## 2024-07-19 NOTE — Discharge Instructions (Signed)
 SURGICAL DISCHARGE INSTRUCTIONS     Dr.Groten performed your Lumbar Punture today at the [facility]    [place]  [facility]:  Monday through Friday from 8 a.m. - 4 p.m.: [phone]    For T&D: [phone]  Between 4 p.m. - 8 a.m., weekends and holidays:  Call ER [phone]    PLEASE SEE WRITTEN HANDOUTS AS DISCUSSED BY YOUR NURSE:      SIGNS AND SYMPTOMS OF A WOUND / INCISION INFECTION   Be sure to watch for the following:  Increase in redness or red streaks near or around the wound or incision.  Increase in pain that is intense or severe and cannot be relieved by the pain medication that your doctor has given you.  Increase in swelling that cannot be relieved by elevation of a body part, or by applying ice, if permitted.  Increase in drainage, or if yellow / green in color and smells bad. This could be on a dressing or a cast.  Increase in fever for longer than 24 hours, or an increase that is higher than 101 degrees Fahrenheit (normal body temperature is 98 degrees Fahrenheit). The incision may feel warm to the touch.    **CALL YOUR DOCTOR IF ONE OR MORE OF THESE SIGNS / SYMPTOMS SHOULD OCCUR.    ANESTHESIA INFORMATION   LOCAL ANESTHETIC:  You have receieved a local anesthetic, the effects should disappear in a few hours.    REMEMBER   If you experience any difficulty breathing, chest pain, bleeding that you feel is excessive, persistent nausea or vomiting or for any other concerns:  Call your physician Dr.Groten  at [phone] . You may also ask to have the general doctor on call paged. They are available to you 24 hours a day.        FOLLOW-UP APPOINTMENTS   Please keep all scheduled appointments that you have scheduled.    Dr Alm Pastor [phone]

## 2024-07-19 NOTE — Sedation Documentation (Signed)
 07/19/24  * No procedures listed * lumbar puncture    Diagnosis:  Multiple cranial neuropathies    Sedation Informed Consent, pre-sedation risk assessment and evaluation completed.  History of previous adverse experiences with sedation/analgesia/anesthesia assessed.  Monitored conscious sedation was administered under my direct supervision by an appropriately trained sedation nurse.  Appropriate Facility and Equipment compliant.      Procedure time out  Timeouts       Cox, Emmalene, RN at [facility] Jul 19, 2024 0739 EDT       Timeout Details       Timeout type: Preprocedure              Procedures       IR LUMBAR PUNCTURE              Timeout Questions    Correct patient? Yes  Correct site? Yes  Correct side? N/A  Correct position? Yes  Correct procedure? Yes  Site marked? N/A  H&P note completed? Yes  Consents verified? No  Radiology studies available? N/A  Relevant lab results available? N/A  Are all required blood products & devices for the procedure available? N/A  Is documentation verified? N/A             Staff Present       Staff  Cox, Emmalene, RN              Signing History       Staff Performed Signed    Sherre Emmalene, RN Tue Jul 19, 2024 9260 EDT Tue Jul 19, 2024 0739 EDT                            Physician in  and out times  Physicians    None       Sedation Staff    None         Sedation and Procedure Times:  No events were documented          [place] Scores    Pre Sedation  Activity: 2-->able to move 4 extremities voluntarily or on command     Circulation: 2-->BP within 20% of pre-anesthetic level  Consciousness: 2-->fully awake  O2 Saturation: 2-->able to maintain O2 saturation greater than 92% on room air  Dressing: 2-->dry and clean or not applicable  Pain: 2-->pain free  Ambulation: 2-->able to stand up and walk straight, on ordered bedrest, or performing at previous level of functioning  Fasting/Feeding: 2-->able to drink fluids or NPO, minimal nausea/ no vomiting  Urine Output: 2-->has voided, adequate  urine output per device, or not applicable         Post Sedation                                             Medications (moderate): Fentanyl, Versed  Hospital: [facility]  Unit: Interventional Radiology  IV Type: Peripheral IV  Additional Intervention needed:: No     Effects of Administration: Successful sedation w/o adverse events       Patient was continuously monitored throughout the procedure.  Provider was in attendance throughout sedation.  See Invasive Procedure Log for additional details.    Alm LITTIE Pastor, MD

## 2024-07-19 NOTE — Brief Op Note (Signed)
 PRN IR POST OP: 07/19/24      Radiologist: Dr. Alm LITTIE Pastor    Procedure: IR LUMBAR PUNCTURE    Description of Procedure Findings:      Multiple cranial neuropathies        Estimated Blood Loss:  2.0 mL        Specimen Collected:  For mL spinal fluid            Diagnosis: Multiple cranial neuropathy; as above; dictator report to follow      Alm LITTIE Pastor, MD

## 2024-07-19 NOTE — Telephone Encounter (Signed)
 Lab called with a critical on CSF- nucleated cells are a 10

## 2024-07-20 ENCOUNTER — Encounter (INDEPENDENT_AMBULATORY_CARE_PROVIDER_SITE_OTHER): Payer: Self-pay | Admitting: NEUROLOGY

## 2024-07-20 LAB — SERUM FOR MS PROFILE

## 2024-07-22 LAB — CSF FOR MS PROFILE
ALBUMIN, CSF: 181 mg/dL — ABNORMAL HIGH (ref 8.0–42.0)
ALBUMIN, SERUM: 4.2 g/dL (ref 3.6–5.1)
IGG INDEX, CSF: 0.79 — ABNORMAL HIGH (ref <0.70)
IGG, CSF: 18.8 mg/dL — ABNORMAL HIGH (ref 0.8–7.7)
IMMUNOGLOBULIN G, SERUM: 553 mg/dL — ABNORMAL LOW (ref 600–1540)
OLIGOCLONAL BANDS (IGG),CSF: ABSENT
SYNTHESIS RATE IGG, CSF: 40.4 mg/(24.h) — ABNORMAL HIGH (ref ?–9.9)

## 2024-07-22 LAB — MYELIN BASIC PROTEIN CSF: MYELIN BASIC PROTEIN: <2 ug/L (ref <=4.0)

## 2024-07-30 ENCOUNTER — Other Ambulatory Visit (INDEPENDENT_AMBULATORY_CARE_PROVIDER_SITE_OTHER): Payer: Self-pay | Admitting: NEUROLOGY

## 2024-07-30 ENCOUNTER — Other Ambulatory Visit (INDEPENDENT_AMBULATORY_CARE_PROVIDER_SITE_OTHER): Payer: Self-pay | Admitting: Internal Medicine

## 2024-08-06 ENCOUNTER — Other Ambulatory Visit: Payer: Self-pay

## 2024-08-09 ENCOUNTER — Encounter (INDEPENDENT_AMBULATORY_CARE_PROVIDER_SITE_OTHER): Payer: Self-pay | Admitting: NEUROLOGY

## 2024-08-10 ENCOUNTER — Other Ambulatory Visit (INDEPENDENT_AMBULATORY_CARE_PROVIDER_SITE_OTHER): Payer: Self-pay | Admitting: Internal Medicine

## 2024-08-11 ENCOUNTER — Encounter (INDEPENDENT_AMBULATORY_CARE_PROVIDER_SITE_OTHER): Payer: Self-pay | Admitting: NEUROLOGY

## 2024-08-16 ENCOUNTER — Encounter (INDEPENDENT_AMBULATORY_CARE_PROVIDER_SITE_OTHER): Payer: Self-pay | Admitting: NEUROLOGY

## 2024-08-16 ENCOUNTER — Encounter (INDEPENDENT_AMBULATORY_CARE_PROVIDER_SITE_OTHER): Payer: Self-pay

## 2024-08-16 ENCOUNTER — Ambulatory Visit (INDEPENDENT_AMBULATORY_CARE_PROVIDER_SITE_OTHER): Payer: Self-pay

## 2024-08-16 ENCOUNTER — Other Ambulatory Visit: Payer: Self-pay

## 2024-08-16 ENCOUNTER — Ambulatory Visit: Attending: NEUROLOGY

## 2024-08-16 VITALS — BP 136/72 | HR 76 | Wt 177.4 lb

## 2024-08-16 DIAGNOSIS — G629 Polyneuropathy, unspecified: Secondary | ICD-10-CM

## 2024-08-16 DIAGNOSIS — G527 Disorders of multiple cranial nerves: Secondary | ICD-10-CM | POA: Insufficient documentation

## 2024-08-16 DIAGNOSIS — M049 Autoinflammatory syndrome, unspecified: Secondary | ICD-10-CM | POA: Insufficient documentation

## 2024-08-16 DIAGNOSIS — M4802 Spinal stenosis, cervical region: Secondary | ICD-10-CM

## 2024-08-16 DIAGNOSIS — H47391 Other disorders of optic disc, right eye: Secondary | ICD-10-CM

## 2024-08-16 DIAGNOSIS — Z85828 Personal history of other malignant neoplasm of skin: Secondary | ICD-10-CM

## 2024-08-16 LAB — HIV 1 AND 2 RAPID SCREEN
HIV-1/2 ANTIBODY SCREEN: NONREACTIVE
HIV1-p24 ANTIGEN SCREEN: NONREACTIVE

## 2024-08-16 LAB — CBC WITH DIFF
BASOPHIL #: 0 x10ˆ3/uL (ref 0.00–0.10)
BASOPHIL %: 1 % (ref 0–1)
EOSINOPHIL #: 0 x10ˆ3/uL (ref 0.00–0.50)
EOSINOPHIL %: 0 % — ABNORMAL LOW (ref 1–7)
HCT: 41.9 % (ref 31.2–41.9)
HGB: 14.7 g/dL — ABNORMAL HIGH (ref 10.9–14.3)
LYMPHOCYTE #: 1 x10ˆ3/uL — ABNORMAL LOW (ref 1.10–3.10)
LYMPHOCYTE %: 22 % (ref 16–46)
MCH: 29.3 pg (ref 24.7–32.8)
MCHC: 35 g/dL (ref 32.3–35.6)
MCV: 83.6 fL (ref 75.5–95.3)
MONOCYTE #: 0.4 x10ˆ3/uL (ref 0.20–0.90)
MONOCYTE %: 8 % (ref 4–11)
MPV: 7.8 fL — ABNORMAL LOW (ref 7.9–10.8)
NEUTROPHIL #: 3.2 x10ˆ3/uL (ref 1.90–8.20)
NEUTROPHIL %: 69 % (ref 43–77)
PLATELETS: 258 x10ˆ3/uL (ref 140–440)
RBC: 5.01 x10ˆ6/uL — ABNORMAL HIGH (ref 3.63–4.92)
RDW: 13.4 % (ref 12.3–17.7)
WBC: 4.6 x10ˆ3/uL (ref 3.8–11.8)

## 2024-08-16 LAB — SEDIMENTATION RATE: ERYTHROCYTE SEDIMENTATION RATE (ESR): 7 mm/h (ref <30)

## 2024-08-16 LAB — C-REACTIVE PROTEIN(CRP),INFLAMMATION: C-REACTIVE PROTEIN (CRP): 0.8 mg/dL — ABNORMAL HIGH (ref 0.1–0.5)

## 2024-08-16 LAB — SYPHILIS AB SCREEN, WITH REFLEX: SYPHILIS ANTIBODY QUALITATIVE: NEGATIVE

## 2024-08-16 NOTE — Progress Notes (Signed)
 FAXED REFERRED TO [facility]

## 2024-08-16 NOTE — Progress Notes (Addendum)
 ASSESSMENT  TRIGEMINAL NEURALGIA:  She is a 68 year old woman who returns to clinic for over 2 years of progressive facial pain and numbness on the right side.  Symptoms started somewhat insidiously with frank numbness before becoming painful and tender to touch.  Her exam is notable for sensory deficits in V2 on the right side. We did obtain an MRI and MRA of the head which were essentially unremarkable. Nerve conduction studies revealed decrease amplitudes of the right trigeminal nerve compared to the left. She has experienced significant response to Trileptal  but has required high doses. She still has intermittent breakthrough symptoms and is also on gabapentin . This again has been helpful but incomplete. TID dosing has improved this. There has been some concern of medication side effect leading to dizziness which is certainly possible, however, she is overall happy with the balance of side effects to pain management.   2.   CERVICAL SPINAL STENOSIS: She has a longer standing history of paresthesias in the arms and legs.  These can be painful at times.  Her exam was previously notable for mostly small fiber deficits in a patchy distribution in the lower extremities calling into question possible small fiber neuropathy which does have an association with fibromyalgia.  EMG revealed no evidence of a large fiber process. The addition of trileptal  and gabapentin  has helped her symptoms. We then obtained MRI of the cervical spine which showed severe canal stenosis which may be driving her symptoms. We discussed this in detail as well as symptoms that should prompt urgent evaluation. For now she would like to defer discussion with surgeon.   3. MULTIPLE CRANIAL NEUROPATHIES: This has been new in the last 6-8 months. It appears to have been gradual in onset. She has frank binocular diplopia concerning for cranial nerve abnormality with difficulty particularly with down gaze and slightly to accomodation. There appears to  be no fatigability. She also previously had mild asymmetry of the right lower face, however, this has significantly worsened so that right lower face is largely paralyzed with some mild weakness with right eye closure with forehead sparing.  Repeat MRI and MG antibody panel was negative. MRV was completed which was likewise unrevealing.  She now has multiple clearly worsening cranial neuropathies with previous negative imaging and negative rheumatologic workup.  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  Repeat MRI of the brain and cervical spine was unrevealing as was paraneoplastic panel.  Now she has developed conjunctival injection and discharge from the right eye. It is possible that this is coincidental. It is also possible that her eye is not closing with sleep, causing irritation. Since previous visit, she underwent a lumbar puncture unfortunately, IR was unable obtain enough fluid for appropriate testing though cell counts were normal. Given progression of ocular symptoms, we will pursue dedicated orbit imaging to assess for orbital apex or Tolosa-hunt syndrome. Though this would not explain hypoglossal nerve involvement. We will expand serum work up to explore infectious etiologies. Will reach out to neuroimmunology in [place] to see if they will review case and potentially meet with her via telehealth because she struggles with driving longer distances. Advised to go see her optometrist ASAP. Will also refer to ophthalmology.     PLAN  1. Continue Trileptal   900 mg BID      Continue gabapentin   800 TID   2.  Continue to monitor  If worsening, will refer to NSGY  3. Obtain lyme antibody, RPR, HIV 1/2   ESR, CRP, ACE, CBC, MOG antibody   Will discuss case with neuroimmunology   Will refer to ophthalmology     Return to clinic in 1 month    Thank you for allowing me to participate  in your patient's care and please do not hesitate to contact me for any questions or concerns.    Jo Corona, APRN, CNP    [place]  [facility]     I personally spent a total of 40 minutes today preparing to see the patient, in the encounter with the patient, and documenting after the visit.    I personally saw and evaluated the patient as part of a shared service with an APP.    My substantive findings are:  MDM (complete) 68 year old woman who returns to clinic today for progressively worsening multiple cranial neuropathies.  Since last visit she has developed conjunctival injection and discharge from the eye that has been affected by ptosis.  Lumbar puncture unfortunately was largely nondiagnostic as they could not get enough fluid for testing.  We will expand serum workup and obtain a dedicated orbit study.  We will also reach out to neuro immunology in [facility] to see if they would review the case and potentially see her on a telehealth basis.      [id number]: I will continue to be the provider focal point in managing the chronic complex neurological condition  ==========================================================================================================================================    NAME:  Jo Wong  DOB:  1955-12-11  VISIT DATE:  06/17/2022    CC:  Facial Numbness    Patient seen in consultation at the request of Dr. Oneil Alvine  History obtained from the patient and chart/records  Age of patient:  68 y.o.    INTERVAL:     08/16/2024  Since Jo Wong was last seen, her eye has been bothering her more. She reports getting a film across her eye. She has been using eye drops more for hydration. She states that she is having discharge from the eye that looks like snot.  Continuing to have double vision. Eye has progressively gotten more red. Has been having progressively more pain in the eye.     She underwent a lumbar puncture unfortunately, IR was unable obtain  enough fluid for appropriate testing    Since last visit, symptoms are largely unchanged. Still experiencing frequent diplopia. Right facial droop still quite prominent. She has not noted any new deficits since last seen.     HPI:   I had the pleasure of seeing your patient in neurology clinic for an outpatient consultation, who is a 68 y.o. year old female who was referred for evaluation of facial numbness.  Please allow me to summarize the history for the record.     She states that symptoms began in November 2022.  It started with numbness that began under the right eye.  She did have a red spot in the same location at the time.  She does have a history of skin cancer and was given topical chemotherapeutic agent by her dermatologist to treat this.  Despite this, the numbness persisted and then began to extend down the right side of the face to the lip and laterally towards the ear.  Gradually it became painful and tender to touch.  She reports the even light stimulus now can cause pain on the right side of her face.  She denies any  weakness.  She does report that her right eye seems to have had a very mild decline in visual acuity.  She denies any changes in taste.  She does have a previous history of headaches though nothing with similar symptoms.  She also reports more longstanding numbness and tingling in the arms and legs.  She does report a history of fibromyalgia.  She does have some lightheadedness with standing.  She does have issues with constipation and diarrhea as she has a history of IBS.    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    Headache, post-traumatic    Thyroid  nodule    Thrombosis of mesenteric vein (CMS HCC)    Thromboembolic disorder (CMS HCC)    Fibromyositis    Multiple  nodules of lung    IBS (irritable bowel syndrome)    Insomnia    Acquired hypothyroidism    Mixed hyperlipidemia    Hypertension    Major depressive disorder    Prolapsed cervical intervertebral disc    Cyst of ovary    Fibromyalgia    Generalized anxiety disorder    Actinic keratosis    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    OSA on CPAP    TMJ (dislocation of temporomandibular joint)    Chronic cough    Major depression, recurrent (CMS HCC)     Past Surgical History:   Procedure Laterality Date    BILATERAL OOPHORECTOMY      COLONOSCOPY  2017    Dr mal    HX CHOLECYSTECTOMY      HX LASER SKIN LESION REMOVAL      01/31/22    HX THYROIDECTOMY      HX TUBAL LIGATION      HX TUMOR REMOVAL  01/12/2024    throat    NECK SURGERY      mohs surgery x 3  under  chin  on left side    Chad Johnson  01/12/2024    SKIN GRAFT      12/2020  right upper arm    SKIN LESION EXCISION      removal of pigmented skin lesion    SQUAMOUS CELL CARCINOMA EXCISION      10/2019  left shoulder and right front shoulder 03/2021  back right         Family Medical History:       Problem Relation (Age of Onset)    Aortic Aneurysm Brother    Arthritis-rheumatoid Sister    Back Problems Brother    Blood Clots Mother    Breast Disease Sister    Cerebral Aneurysm Maternal Aunt    Deep vein thrombosis Sister    Heart Disease Mother    Hypertension (High Blood Pressure) Mother    Other Father    Primary Brain tumor Brother    Stroke Mother    Ulcers Brother            Current Outpatient Medications   Medication Sig Dispense Refill    albuterol  sulfate (PROVENTIL  HFA/VENTOLIN  HFA) 90 mcg Inhalation Inhaler Take 2 Puffs by inhalation Every 6 hours as needed 18 g 3    ARIPiprazole  (ABILIFY ) 5 mg Oral Tablet Take 1 Tablet (5 mg total) by mouth Once a day 90 Tablet 3    budesonide-glycopyr-formoterol (BREZTRI  AEROSPHERE) 160-9-4.8 mcg/actuation Inhalation HFA Aerosol Inhaler Take 2 Puffs by inhalation Twice daily  42.8 g 5    buPROPion  (WELLBUTRIN  XL) 300 mg  extended release 24 hr tablet Take 1 Tablet (300 mg total) by mouth Once a day 90 Tablet 2    Cholecalciferol, Vitamin D3, 50 mcg (2,000 unit) Oral Capsule Take 1 Capsule by mouth Once a day      clotrimazole (MYCELEX) 10 mg Mucous Membrane Troche DISSOLVE 1 TABLET BY MOUTH 4 TIMES DAILY (AFTER MEALS AND AT BEDTIME)      famotidine  (PEPCID ) 40 mg Oral Tablet TAKE 1 TABLET EVERY EVENING 90 Tablet 3    gabapentin  (NEURONTIN ) 800 mg Oral Tablet Take 1 Tablet (800 mg total) by mouth Three times a day 270 Tablet 0    levothyroxine  (SYNTHROID) 50 mcg Oral Tablet TAKE 1 TABLET EVERY DAY 90 Tablet 3    losartan  (COZAAR ) 50 mg Oral Tablet TAKE 1 TABLET EVERY DAY 90 Tablet 3    meclizine  (ANTIVERT ) 12.5 mg Oral Tablet Take 1 Tablet (12.5 mg total) by mouth Every 12 hours as needed 30 Tablet 0    mecobalamin, vitamin B12, 1,000 mcg Oral Tablet, Chewable Chew 1 Tablet (1,000 mcg total) Once a day      milnacipran  (SAVELLA ) 100 mg Oral Tablet Take 1 Tablet (100 mg total) by mouth Twice daily (Patient taking differently: Take 1 Tablet (100 mg total) by mouth Once a day) 180 Tablet 2    montelukast (SINGULAIR) 10 mg Oral Tablet Take 1 Tablet (10 mg total) by mouth Every evening      nitroGLYCERIN (NITROSTAT) 0.4 mg Sublingual Tablet, Sublingual Place 1 Tablet (0.4 mg total) under the tongue Every 5 minutes as needed for Chest pain for 3 doses over 15 minutes      omega-3 fatty acid (LOVAZA) 1 gram Oral Capsule Take 1 Capsule (1 g total) by mouth Daily      OXcarbazepine  (TRILEPTAL ) 300 mg Oral Tablet Take 3 Tablets (900 mg total) by mouth Twice daily for 180 days 540 Tablet 1    pantoprazole  (PROTONIX ) 40 mg Oral Tablet, Delayed Release (E.C.) TAKE 1 TABLET EVERY DAY 90 Tablet 3    pravastatin  (PRAVACHOL ) 40 mg Oral Tablet TAKE 1 TABLET EVERY EVENING 90 Tablet 3    Triamcinolone  Acetonide (NASACORT ) 55 mcg Nasal Aerosol, Spray Administer 2 Sprays into affected nostril(s) Once a day 16.9 mL 5     No current facility-administered  medications for this visit.     Allergies   Allergen Reactions    Doxycycline  Other Adverse Reaction (Add comment)     Sweats and flushing    vomiting    Duloxetine  Other Adverse Reaction (Add comment)     Sweats, flushing, vomiting     Social History     Socioeconomic History    Marital status: Married     Spouse name: Not on file    Number of children: Not on file    Years of education: Not on file    Highest education level: Not on file   Occupational History    Not on file   Tobacco Use    Smoking status: Never    Smokeless tobacco: Never   Vaping Use    Vaping status: Never Used   Substance and Sexual Activity    Alcohol use: Never    Drug use: Never    Sexual activity: Not on file   Other Topics Concern    Not on file   Social History Narrative    Not on file  Social Determinants of Health     Financial Resource Strain: Low Risk (03/04/2022)    Financial Resource Strain     SDOH Financial: No   Transportation Needs: Low Risk (03/04/2022)    Transportation Needs     SDOH Transportation: No   Social Connections: Low Risk (03/04/2022)    Social Connections     SDOH Social Isolation: 5 or more times a week   Intimate Partner Violence: Low Risk (03/04/2022)    Intimate Partner Violence     SDOH Domestic Violence: No   Housing Stability: Low Risk (03/04/2022)    Housing Stability     SDOH Housing Situation: I have housing.     SDOH Housing Worry: No       ============================================================================================================================================  GENERAL EXAMINATION  BP 136/72 (Site: Left Arm, Patient Position: Sitting)   Pulse 76   Wt 80.5 kg (177 lb 6.4 oz)   SpO2 92%   BMI 30.45 kg/m     Vital signs personally reviewed  General: No acute distress, alert  HEENT: Normocephalic, injected conjunctiva with purulent drainage in right eye  Extremities: No significant edema, No cyanosis    NEUROLOGIC EXAM  On neurological exam, patient was awake, alert and answering  questions appropriately  Speech was fluent, without dysarthria or aphasia.    CN  II:  Visual fields intact to confrontation  III, IV, VI: reports frank horizontal diplopia with upgaze, downgaze and accommodation  V:  Diminished in all distributions to light touch on the right  VII: right lower facial paralysis, mild weakness with eye closure, frontalis and platysma appear to spared  VIII: grossly intact  IX, X: symmetric palatal elevation  XI: normal strength of trapezius and sternocleidomastoid bilaterally  XII: tongue midline with full movements    MOTOR  Bulk: normal  Abnormal Movements: none    Strength:     MRC Grading Scale   Right Left   Deltoid 5 5   Biceps 5 5   Triceps 5 5   Wrist Extension - -   Wrist Flexion - -   Finger Extension - -   Finger Abduction - -   Finger Flexion - -   Hip Flexion 5 5   Hip Extension - -   Hip Abduction - -   Hip Adduction - -   Knee Extension - -   Knee Flexion - -   Ankle Dorsiflexion - -   Ankle Plantarflexion - -   Toe Extension - -   Toe Flexion - -             REFLEXES   Right Left   Biceps 3 3   Triceps 2 2   Brachioradialis 2 2   Patellar 3 3   Achilles 1 1   Plantar - -   Hoffman - -   Pectoralis - -   Jaw Jerk - -       SENSORY  Light touch: intact throughout all extremities    GAIT  General: casual, normal gait    ================================================================================================================================  LABS  Personal Review of prior labs is notable for:   2025  Paraneoplastic panel negative  MG antibody panel negative  2024  CMP WNL   TSH normal  CBC largely WNL   2023  Hepatitis panel negative  Vitamin-D within normal limits   LDL 127   TSH within normal limits   CMP largely within normal limits   CBC largely within normal limits  SSA/B antibodies negative  Rheumatoid factor negative   Anti CCP antibody negative   ANA negative   SPEP within normal limits   B12 greater than 1500   A1c 4.9  IMAGING  Personal Review of imaging  is notable for:    MRI Brain and Cervical Spine October 2025 without clear etiology for symptoms  MRV intracranial May 2025 - unremarkable  MRI Brain w/o contrast 07/17/2022:  Mild degree of atrophy likely in keeping with age otherwise essentially unremarkable study   MRA Head July 17, 2022:  No vascular compression of the trigeminal nerve on the right  OTHER DIAGNOSTICS  Personal Review of other prior diagnostics is notable for:  Not applicable

## 2024-08-17 ENCOUNTER — Encounter (INDEPENDENT_AMBULATORY_CARE_PROVIDER_SITE_OTHER): Payer: Self-pay | Admitting: Internal Medicine

## 2024-08-17 LAB — LYME ANTIBODY PANEL WITH REFLEX: LYME ANTIBODY TOTAL (Screen): NEGATIVE

## 2024-08-19 LAB — ANGIOTENSIN CONVERTING ENZYME (ACE), SERUM: ANGIOTENSIN CONV ENZYME: 57 U/L (ref 9–67)

## 2024-08-22 ENCOUNTER — Other Ambulatory Visit: Payer: Self-pay

## 2024-08-22 LAB — MYELIN OLIG GLYCO (MOG-IGG1) FLUOR-ACT CELL SORTING (FACS) ASSAY, SERUM: MOG AB CBA, SERUM: NEGATIVE

## 2024-08-23 ENCOUNTER — Ambulatory Visit: Payer: Self-pay | Admitting: Internal Medicine

## 2024-08-23 ENCOUNTER — Other Ambulatory Visit: Payer: Self-pay

## 2024-08-23 ENCOUNTER — Encounter (INDEPENDENT_AMBULATORY_CARE_PROVIDER_SITE_OTHER): Payer: Self-pay

## 2024-08-23 ENCOUNTER — Ambulatory Visit (INDEPENDENT_AMBULATORY_CARE_PROVIDER_SITE_OTHER): Payer: Self-pay | Admitting: Internal Medicine

## 2024-08-23 ENCOUNTER — Encounter (INDEPENDENT_AMBULATORY_CARE_PROVIDER_SITE_OTHER): Payer: Self-pay | Admitting: Internal Medicine

## 2024-08-23 ENCOUNTER — Other Ambulatory Visit (INDEPENDENT_AMBULATORY_CARE_PROVIDER_SITE_OTHER): Payer: Self-pay | Admitting: NEUROLOGY

## 2024-08-23 ENCOUNTER — Encounter (INDEPENDENT_AMBULATORY_CARE_PROVIDER_SITE_OTHER): Payer: Self-pay | Admitting: NEUROLOGY

## 2024-08-23 VITALS — BP 130/86 | HR 88 | Ht 64.0 in | Wt 180.4 lb

## 2024-08-23 DIAGNOSIS — E782 Mixed hyperlipidemia: Secondary | ICD-10-CM

## 2024-08-23 DIAGNOSIS — I10 Essential (primary) hypertension: Secondary | ICD-10-CM

## 2024-08-23 DIAGNOSIS — E039 Hypothyroidism, unspecified: Secondary | ICD-10-CM

## 2024-08-23 DIAGNOSIS — G894 Chronic pain syndrome: Secondary | ICD-10-CM

## 2024-08-23 LAB — LIPID PANEL
CHOL/HDL RATIO: 3.6
CHOLESTEROL: 193 mg/dL (ref <200)
HDL CHOL: 54 mg/dL (ref >=40)
LDL CALC: 102 mg/dL — ABNORMAL HIGH (ref 0–100)
TRIGLYCERIDES: 187 mg/dL — ABNORMAL HIGH (ref <=150)
VLDL CALC: 37 mg/dL (ref 0–50)

## 2024-08-23 LAB — CBC WITH DIFF
BASOPHIL #: 0 x10ˆ3/uL (ref 0.00–0.10)
BASOPHIL %: 0 % (ref 0–1)
EOSINOPHIL #: 0 x10ˆ3/uL (ref 0.00–0.50)
EOSINOPHIL %: 0 % — ABNORMAL LOW (ref 1–7)
HCT: 41.6 % (ref 31.2–41.9)
HGB: 14.6 g/dL — ABNORMAL HIGH (ref 10.9–14.3)
LYMPHOCYTE #: 0.9 x10ˆ3/uL — ABNORMAL LOW (ref 1.10–3.10)
LYMPHOCYTE %: 19 % (ref 16–46)
MCH: 29.5 pg (ref 24.7–32.8)
MCHC: 35.1 g/dL (ref 32.3–35.6)
MCV: 83.9 fL (ref 75.5–95.3)
MONOCYTE #: 0.4 x10ˆ3/uL (ref 0.20–0.90)
MONOCYTE %: 8 % (ref 4–11)
MPV: 8 fL (ref 7.9–10.8)
NEUTROPHIL #: 3.4 x10ˆ3/uL (ref 1.90–8.20)
NEUTROPHIL %: 72 % (ref 43–77)
PLATELETS: 273 x10ˆ3/uL (ref 140–440)
RBC: 4.96 x10ˆ6/uL — ABNORMAL HIGH (ref 3.63–4.92)
RDW: 13.2 % (ref 12.3–17.7)
WBC: 4.7 x10ˆ3/uL (ref 3.8–11.8)

## 2024-08-23 LAB — COMPREHENSIVE METABOLIC PNL, FASTING
ALBUMIN/GLOBULIN RATIO: 1.9 — ABNORMAL HIGH (ref 0.8–1.4)
ALBUMIN: 4.6 g/dL (ref 3.5–5.7)
ALKALINE PHOSPHATASE: 117 U/L — ABNORMAL HIGH (ref 34–104)
ALT (SGPT): 18 U/L (ref 7–52)
ANION GAP: 6 mmol/L (ref 4–13)
AST (SGOT): 21 U/L (ref 13–39)
BILIRUBIN TOTAL: 0.4 mg/dL (ref 0.3–1.0)
BUN/CREA RATIO: 17 (ref 6–22)
BUN: 15 mg/dL (ref 7–25)
CALCIUM, CORRECTED: 8.9 mg/dL (ref 8.9–10.8)
CALCIUM: 9.4 mg/dL (ref 8.6–10.3)
CHLORIDE: 106 mmol/L (ref 98–107)
CO2 TOTAL: 29 mmol/L (ref 21–31)
CREATININE: 0.9 mg/dL (ref 0.60–1.30)
ESTIMATED GFR: 69 mL/min/1.73mˆ2 (ref >59)
GLOBULIN: 2.4 (ref 2.0–3.5)
GLUCOSE: 89 mg/dL (ref 74–109)
OSMOLALITY, CALCULATED: 282 mosm/kg (ref 270–290)
POTASSIUM: 4.5 mmol/L (ref 3.5–5.1)
PROTEIN TOTAL: 7 g/dL (ref 6.4–8.9)
SODIUM: 141 mmol/L (ref 136–145)

## 2024-08-23 LAB — THYROID STIMULATING HORMONE (SENSITIVE TSH): TSH: 2.724 u[IU]/mL (ref 0.450–5.330)

## 2024-08-23 NOTE — Nursing Note (Signed)
 Patient is scheduled for MRI on 09/19/24 @ 6:30 p.m. patient was made aware of the time and date

## 2024-08-23 NOTE — Assessment & Plan Note (Signed)
 BP stable   on cozaar  50 mg daily

## 2024-08-23 NOTE — Progress Notes (Signed)
 [facility]  [address]  [place] [address]  Operated by [facility]      Name: Jo Wong                       Date of Birth: 16-Aug-1956   MRN:  [id number]                         Date of visit: 08/23/2024     PCP: Suzen DELENA Asa, PA-C     Subjective  Jo Wong is a 68 y.o. year old female who presents for Follow Up 4 Months (4 month follow up   having trouble with the right eye  seen [facility] today)   to clinic.  No specialty comments available.   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    Multiple nodules of lung 11/08/2021    IBS (irritable bowel syndrome) 11/08/2021    Insomnia 11/08/2021    Acquired hypothyroidism 11/08/2021    Mixed hyperlipidemia 11/08/2021    Hypertension 11/08/2021    Major depressive disorder 11/08/2021    Prolapsed cervical intervertebral disc 11/08/2021    Cyst of ovary 11/08/2021    Fibromyalgia 11/08/2021    Generalized anxiety disorder 11/08/2021    Actinic keratosis 11/08/2021      Current Outpatient Medications   Medication Sig    albuterol  sulfate (PROVENTIL  HFA/VENTOLIN  HFA) 90 mcg Inhalation Inhaler Take 2 Puffs by inhalation Every 6 hours as needed    ARIPiprazole  (ABILIFY ) 5 mg Oral Tablet Take 1 Tablet (5 mg total) by mouth Once a day    budesonide-glycopyr-formoterol (BREZTRI  AEROSPHERE) 160-9-4.8 mcg/actuation Inhalation HFA Aerosol Inhaler Take 2 Puffs by inhalation Twice daily     buPROPion  (WELLBUTRIN  XL) 300 mg extended release 24 hr tablet Take 1 Tablet (300 mg total) by mouth Once a day    Cholecalciferol, Vitamin D3, 50 mcg (2,000 unit) Oral Capsule Take 1 Capsule by mouth Once a day    clotrimazole (MYCELEX) 10 mg Mucous Membrane Troche DISSOLVE 1 TABLET BY MOUTH 4 TIMES DAILY (AFTER MEALS AND AT BEDTIME)    famotidine  (PEPCID ) 40 mg Oral Tablet TAKE 1 TABLET EVERY EVENING    gabapentin  (NEURONTIN ) 800 mg Oral Tablet Take 1 Tablet (800 mg total) by mouth Three times a day    levothyroxine  (SYNTHROID) 50 mcg Oral Tablet TAKE 1 TABLET EVERY DAY    losartan  (COZAAR ) 50 mg Oral Tablet TAKE 1 TABLET EVERY DAY    meclizine  (ANTIVERT ) 12.5 mg Oral Tablet Take 1 Tablet (12.5 mg total) by mouth Every 12 hours as needed    mecobalamin, vitamin B12, 1,000 mcg Oral Tablet, Chewable Chew 1 Tablet (1,000 mcg total) Once a day    milnacipran  (SAVELLA ) 100 mg Oral Tablet Take 1 Tablet (100 mg total) by mouth Twice daily (Patient taking differently: Take 1 Tablet (  100 mg total) by mouth Once a day)    montelukast (SINGULAIR) 10 mg Oral Tablet Take 1 Tablet (10 mg total) by mouth Every evening    nitroGLYCERIN (NITROSTAT) 0.4 mg Sublingual Tablet, Sublingual Place 1 Tablet (0.4 mg total) under the tongue Every 5 minutes as needed for Chest pain for 3 doses over 15 minutes    omega-3 fatty acid (LOVAZA) 1 gram Oral Capsule Take 1 Capsule (1 g total) by mouth Daily    OXcarbazepine  (TRILEPTAL ) 300 mg Oral Tablet Take 3 Tablets (900 mg total) by mouth Twice daily for 180 days    pantoprazole  (PROTONIX ) 40 mg Oral Tablet, Delayed Release (E.C.) TAKE 1 TABLET EVERY DAY    pravastatin  (PRAVACHOL ) 40 mg Oral Tablet TAKE 1 TABLET EVERY EVENING    Triamcinolone  Acetonide (NASACORT ) 55 mcg Nasal Aerosol, Spray Administer 2 Sprays into affected nostril(s) Once a day        Fu Visit       Right eye redness and not closing for a few  weeks and seen by eye doctor this am  and  to get gtts        Follow up    Recurrent  cough       Will ask for ENT to  get a good visualization   due to above     Dr  Nydia tessalon   pearls  q 8 hrs but on hs only helps some      FU MDD     Follow up Depression patient takes Wellbutrin  300 daily  still feeling down  discussed options   Abilfy  was  up to  5mg  and  down  to   2.5 mg    and  doing  well due to dizziness   Ok to go off and monitor symptoms   Pt says she is frustrated for her kids and what they have going on     Abilify  added last visit  at   5mg   and pt  is doing some better with loss of her  brother and other things going on but  she  feels we can increase dose      Continue  Wellbutrin   as well      FU  Trigeminal neuralgia    Seen by  Dr Sherre  now on trileptal  for trigeminal neuralgia  and  work up reviewed    To see Dr Sherre and will tell him that it  has been worse     Pt on  900 mg bid trileptal     S/p  increase  Neurontin   800 mg bid   now  tid  and helping some better    Seen by Dr Sherre  recently and s/p 11/25 lumbar puncture   and other labs all reviewed   Pt is being consulted with immunology    ( Seeing [facility] for  right eye )        Seen by ENT no new orders       FU Fibromyalgia   Pt is down to  one  daily  of savelle due to cost  on for months and stable  but  will be going off due to cost    Trying pt assistance       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  on symbicort but still not great  will add albuterol   until she fu    meds discussed     ie xolair  or nucala  to discuss with Dr Nydia  when she has fu      On brezetri  ( tried  trelegy but not getting  free with Pt  assistance   )        On fasenra      Follow-up neuropathy and fibromyalgia patient is on gabapentin  600 mg twice daily along with Savella     gabapentin   level has been done  7/21  ( Failed  Cymbalta   on allergy  list  )       FU IBS- C  bm currently 4 -5 days will retry Miralax daily if not effective can try  linzess               REVIEW OF SYSTEMS:   Review of Systems  General: No fever.   No chills.  No weight changes.  HEENT: No vision changes.  Cardiac: No chest pain. No palpitations.  No dizziness.  No light-headedness.  No near syncope.  Resp: No dyspnea at rest, no dyspnea on exertion; no cough or hemoptysis; no orthopnea or PND.  GI: No N/V. No melena.  No bright red blood per rectum.  Ext: No edema.  No claudication.  Neuro: No focal weakness.  No numbness.  All other ROS negative.      Objective: BP 130/86 (Site: Left Arm, Patient Position: Sitting)   Pulse 88   Ht 1.626 m (5' 4)   Wt 81.8 kg (180 lb 6.4 oz)   SpO2 97%   BMI 30.97 kg/m                PHYSICAL EXAM  Physical Exam  Gen: NAD. Alert.    HEENT: PERRL;right   conjunctivae red. No JVD or carotid bruit.  Cardiac: RRR with normal S1, S2.   Lungs: Clear to auscultation bilaterally. No rales. No wheezing. No rhonchi.  Abdomen: Soft, non-tender.non-distended  nl bowel sounds    Extremities: No edema. No cyanosis. No clubbing.  Neurologic:  Grossly intact  Right eye  not  closing completly  Lab Results   Component Value Date    CHOLESTEROL 200 (H) 01/25/2024    HDLCHOL 61 01/25/2024    LDLCHOL 106 (H) 01/25/2024    TRIG 163 (H) 01/25/2024       COMPLETE BLOOD COUNT   Lab Results   Component Value Date    WBC 4.6 08/16/2024    HGB 14.7 (H) 08/16/2024    HCT 41.9 08/16/2024    PLTCNT 258 08/16/2024       DIFFERENTIAL  Lab Results   Component Value Date    PMNS 69 08/16/2024    LYMPHOCYTES 22 08/16/2024    MYELOCYTES 2 07/28/2023    MONOCYTES 8 08/16/2024    EOSINOPHIL 0 (L) 08/16/2024    BASOPHILS 1 08/16/2024    BASOPHILS 0.00 08/16/2024    PMNABS 3.20 08/16/2024    LYMPHSABS 1.00 (L) 08/16/2024    EOSABS 0.00 08/16/2024    MONOSABS 0.40 08/16/2024        COMPREHENSIVE METABOLIC PANEL  Lab Results   Component Value Date    SODIUM 141 03/01/2024    POTASSIUM 4.4 03/01/2024    CHLORIDE 106 03/01/2024    CO2 29 03/01/2024    ANIONGAP 6 03/01/2024    BUN 18 03/01/2024    CREATININE 0.85 03/01/2024    GLUCOSENF 89 03/01/2024    CALCIUM  9.1 03/01/2024    ALB 4.3 06/17/2022    ALBUMIN 4.3  03/01/2024    TOTALPROTEIN 6.4 03/01/2024    ALKPHOS 116 (H) 03/01/2024    AST 13 03/01/2024    ALT 14 03/01/2024    GFR 74 03/01/2024       THYROID  STIMULATING HORMONE  Lab Results   Component Value Date    TSH 2.433 01/25/2024        Lab Results   Component Value Date    HA1C 4.9 06/17/2022     Lab Results   Component Value Date    VITD 85 12/12/2021         Orders Placed This Encounter    CBC/DIFF    COMPREHENSIVE METABOLIC PNL, FASTING    THYROID  STIMULATING HORMONE (SENSITIVE TSH)    LIPID PANEL      Assessment & Plan  Mixed hyperlipidemia  High trig  on  lovaza and pravastatin    daily   Acquired hypothyroidism  Synthroid 50 mcg  one daily    continue   Chronic pain syndrome    Primary hypertension  BP stable   on cozaar   50 mg daily          Importance of medication adherence discussed.      Meds reviewed as well as labs.  Chart reviewed and updated.   Continue current treatment.  Keep follow-up appointment.   Vaccine hx reviewed.   Needs pt assistance with  savelle

## 2024-08-23 NOTE — Assessment & Plan Note (Signed)
 High trig  on  lovaza and pravastatin   daily

## 2024-08-23 NOTE — Assessment & Plan Note (Signed)
 Synthroid 50 mcg  one daily    continue

## 2024-08-24 ENCOUNTER — Encounter (INDEPENDENT_AMBULATORY_CARE_PROVIDER_SITE_OTHER): Payer: Self-pay | Admitting: Neurology

## 2024-08-30 ENCOUNTER — Ambulatory Visit: Admitting: Neurology

## 2024-08-31 ENCOUNTER — Encounter (INDEPENDENT_AMBULATORY_CARE_PROVIDER_SITE_OTHER): Payer: Self-pay | Admitting: NEUROLOGY

## 2024-08-31 ENCOUNTER — Ambulatory Visit (INDEPENDENT_AMBULATORY_CARE_PROVIDER_SITE_OTHER): Admitting: Neurology

## 2024-08-31 ENCOUNTER — Encounter (INDEPENDENT_AMBULATORY_CARE_PROVIDER_SITE_OTHER): Payer: Self-pay

## 2024-08-31 NOTE — Progress Notes (Unsigned)
 TELEMEDICINE DOCUMENTATION:    Patient Location:  {[facility] TELEMED SITE (PATIENT LOCATION):[id number]}    Patient/family aware of provider location:  {YES NO:23040}  Patient/family consent for telemedicine:  {YES NO:23040}  Examination observed and performed by:  {[facility] TELEMED PROVIDERS:[id number]}    [place] Department of Neurology      Date:  08/31/2024  Name: Jo Wong  Age:  68 y.o.  Referring Physician:   No referring provider defined for this encounter.    History of Present Illness:  History obtained from:  {HISTORY PROVIDED BY:19225::patient}    Jo Wong is a 68 y.o. female who presents with a chief concern of No chief complaint on file.     -History of squamous cell carcinoma    Pertinent Studies:  -MRV intracranial 01/2024:   NO EVIDENCE OF DURAL VENOUS SINUS THROMBOSIS.   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   -MRI C spine 06/2024:   MULTILEVEL DEGENERATIVE CHANGES MOST SIGNIFICANTLY AFFECTING C3-4 WHERE THERE IS SEVERE SPINAL CANAL NARROWING AND MODERATE RIGHT AND SEVERE LEFT NEURAL FORAMINAL NARROWING. 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   Labs 05/2022: SSA/B negative, RF <13, CCP antibody negative, ANA normal, SPEP normal  -Myasthenia panel and MUSK antibody negative 10/2023  -Serum paraneoplastic panel negative 05/2024.   -CSF 06/2024: 10 WBC, 161569 RBC, glucose 76, protein >200, 3 matched OCBs, IgG index 0.79 (ULN 0.70), myelin basic protein negative. Cytology and flow cytometry were unable to be completed.   -Serum labs 07/2024: HIV negative, lyme negative, MOG negative, ESR normal, CRP slightly high at 0/8, ACE normal, RPR negative.    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     Stomach cramps     Thromboembolic disorder (CMS HCC)     Thrombosis of mesenteric vein (CMS HCC)     Thyroid  nodule          Past Surgical History:   Procedure Laterality Date    BILATERAL OOPHORECTOMY      COLONOSCOPY  2017    Dr mal    HX CHOLECYSTECTOMY      HX LASER SKIN LESION REMOVAL      01/31/22    HX THYROIDECTOMY      HX TUBAL LIGATION      HX TUMOR REMOVAL  01/12/2024    throat    NECK SURGERY      [facility]s surgery x 3  under  chin  on left side    Chad Johnson  01/12/2024    SKIN GRAFT      12/2020  right upper arm    SKIN LESION EXCISION      removal of pigmented skin lesion  SQUAMOUS CELL CARCINOMA EXCISION      10/2019  left shoulder and right front shoulder 03/2021  back right         Current Outpatient Medications   Medication Sig    albuterol  sulfate (PROVENTIL  HFA/VENTOLIN  HFA) 90 mcg Inhalation Inhaler Take 2 Puffs by inhalation Every 6 hours as needed    ARIPiprazole  (ABILIFY ) 5 mg Oral Tablet Take 1 Tablet (5 mg total) by mouth Once a day    budesonide-glycopyr-formoterol (BREZTRI  AEROSPHERE) 160-9-4.8 mcg/actuation Inhalation HFA Aerosol Inhaler Take 2 Puffs by inhalation Twice daily    buPROPion  (WELLBUTRIN  XL) 300 mg extended release 24 hr tablet Take 1 Tablet (300 mg total) by mouth Once a day    Cholecalciferol, Vitamin D3, 50 mcg (2,000 unit) Oral Capsule Take 1 Capsule by mouth Once a day    clotrimazole (MYCELEX) 10 mg Mucous Membrane Troche DISSOLVE 1 TABLET BY MOUTH 4 TIMES DAILY (AFTER MEALS AND AT BEDTIME)    famotidine  (PEPCID ) 40 mg Oral Tablet TAKE 1 TABLET EVERY EVENING    gabapentin  (NEURONTIN ) 800 mg Oral Tablet Take 1 Tablet (800 mg total) by mouth Three times a day     levothyroxine  (SYNTHROID) 50 mcg Oral Tablet TAKE 1 TABLET EVERY DAY    losartan  (COZAAR ) 50 mg Oral Tablet TAKE 1 TABLET EVERY DAY    meclizine  (ANTIVERT ) 12.5 mg Oral Tablet Take 1 Tablet (12.5 mg total) by mouth Every 12 hours as needed    mecobalamin, vitamin B12, 1,000 mcg Oral Tablet, Chewable Chew 1 Tablet (1,000 mcg total) Once a day    milnacipran  (SAVELLA ) 100 mg Oral Tablet Take 1 Tablet (100 mg total) by mouth Twice daily (Patient taking differently: Take 1 Tablet (100 mg total) by mouth Once a day)    montelukast (SINGULAIR) 10 mg Oral Tablet Take 1 Tablet (10 mg total) by mouth Every evening    nitroGLYCERIN (NITROSTAT) 0.4 mg Sublingual Tablet, Sublingual Place 1 Tablet (0.4 mg total) under the tongue Every 5 minutes as needed for Chest pain for 3 doses over 15 minutes    omega-3 fatty acid (LOVAZA) 1 gram Oral Capsule Take 1 Capsule (1 g total) by mouth Daily    OXcarbazepine  (TRILEPTAL ) 300 mg Oral Tablet TAKE 3 TABLETS BY MOUTH TWICE DAILY    pantoprazole  (PROTONIX ) 40 mg Oral Tablet, Delayed Release (E.C.) TAKE 1 TABLET EVERY DAY    pravastatin  (PRAVACHOL ) 40 mg Oral Tablet TAKE 1 TABLET EVERY EVENING    Triamcinolone  Acetonide (NASACORT ) 55 mcg Nasal Aerosol, Spray Administer 2 Sprays into affected nostril(s) Once a day     Allergies[1]  Family Medical History:       Problem Relation (Age of Onset)    Aortic Aneurysm Brother    Arthritis-rheumatoid Sister    Back Problems Brother    Blood Clots Mother    Breast Disease Sister    Cerebral Aneurysm Maternal Aunt    Deep vein thrombosis Sister    Heart Disease Mother    Hypertension (High Blood Pressure) Mother    Other Father    Primary Brain tumor Brother    Stroke Mother    Ulcers Brother            Social History     Socioeconomic History    Marital status: Married   Tobacco Use    Smoking status: Never    Smokeless tobacco: Never   Vaping Use    Vaping status: Never Used   Substance and Sexual Activity  Alcohol use: Never    Drug use: Never      Social Determinants of Health     Financial Resource Strain: Low Risk (03/04/2022)    Financial Resource Strain     SDOH Financial: No   Transportation Needs: Low Risk (03/04/2022)    Transportation Needs     SDOH Transportation: No   Social Connections: Low Risk (03/04/2022)    Social Connections     SDOH Social Isolation: 5 or more times a week   Intimate Partner Violence: Low Risk (03/04/2022)    Intimate Partner Violence     SDOH Domestic Violence: No   Housing Stability: Low Risk (03/04/2022)    Housing Stability     SDOH Housing Situation: I have housing.     SDOH Housing Worry: No       Review of Systems:  Other than above, no pertinent positives.    Examination:    Vitals: There were no vitals taken for this visit.      General Exam:  General: No acute distress.  HEENT: No scleral icterus.  Psychiatric: Affect calm.    Neurologic Exam:  Ophthalmoscopic: {opthalm:42510::Limited undilated exam, no obvious papilledema or pallor.}  Orientation: Awake, alert, appropriately oriented.  Memory: Recent and remote memory appear intact. Formal memory testing not completed today.  Attention: Normal in conversation.  Knowledge: Appropriate.  Language: Fluent with intact comprehension.  Speech: Normal.  Cranial nerves: Pupils are equal and reactive bilaterally. Extraocular movements are intact. Facial sensation intact. Face activates symmetrically. Hearing intact to finger rub bilaterally. Shoulder shrug 5/5 bilaterally. Tongue and uvula midline.   Sensory: {Sensory:42220}  Muscle tone: Normal.    Muscle exam:  Arm Right Left Leg Right Left   Deltoid {number 1-5:25396::5}/5 {number 1-5:25396::5}/5 Iliopsoas {number 1-5:25396::5}/5 {number 1-5:25396::5}/5   Biceps {number 1-5:25396::5}/5 {number 1-5:25396::5}/5 Quads {number 1-5:25396::5}/5 {number 1-5:25396::5}/5   Triceps {number 1-5:25396::5}/5 {number 1-5:25396::5}/5 Hamstrings {number 1-5:25396::5}/5 {number 1-5:25396::5}/5   Interossei  {number 1-5:25396::5}/5 {number 1-5:25396::5}/5 Ankle Dorsi Flexion {number 1-5:25396::5}/5 {number 1-5:25396::5}/5   APB {number 1-5:25396::5}/5 {number 1-5:25396::5}/5 Ankle Plantar Flexion {number 1-5:25396::5}/5 {number 1-5:25396::5}/5     Reflexes:   RJ BJ TJ KJ AJ Plantars   Right {Reflex:42509::2+} {Reflex:42509::2+} {Reflex:42509::2+} {Reflex:42509::2+} {Reflex:42509::2+} {U/D/M:42218::Downgoing}   Left {Reflex:42509::2+} {Reflex:42509::2+} {Reflex:42509::2+} {Reflex:42509::2+} {Reflex:42509::2+} {U/D/M:42218::Downgoing}     Coordination: Finger-nose without dysmetria bilaterally.  Gait: Normal casual gait. Negative Romberg.    Assessment and Plan:    No diagnosis found.      WBC in CSF not elevated when corrected for number of RBC. Elevated protein may also be partly attributable to traumatic LP and can also occur with severe spinal stenosis but I would be hesitant to assume traumatic LP is the culprit of her CSF findings given her clinical picture and history of malignancy. I recommend repeat LP with flow cytometry and cytology.     GBS variant would be less likely with her time course.      PET - if insurance will not approve will get CT chest/abdomen/pelvis instead  Repeat LP with flow cytometry, cytology, bacterial and fungal cultures, **viral labs    Quantiferon    ? Rheumatology labs    Amyloid - ?? Genetic/hereditary amyloid    ? Other systemic etiology    B6, B12, thiamine, folate, copper    No orders of the defined types were placed in this encounter.      Andrea Sink, MD 08/31/2024, 11:37  Associate Professor, Department of Neurology         [  1]   Allergies  Allergen Reactions    Doxycycline  Other Adverse Reaction (Add comment)     Sweats and flushing    vomiting    Duloxetine  Other Adverse Reaction (Add comment)     Sweats, flushing, vomiting

## 2024-09-05 ENCOUNTER — Encounter (INDEPENDENT_AMBULATORY_CARE_PROVIDER_SITE_OTHER): Payer: Self-pay | Admitting: NEUROLOGY

## 2024-09-05 ENCOUNTER — Encounter (INDEPENDENT_AMBULATORY_CARE_PROVIDER_SITE_OTHER): Payer: Self-pay

## 2024-09-11 ENCOUNTER — Other Ambulatory Visit: Payer: Self-pay

## 2024-09-12 ENCOUNTER — Other Ambulatory Visit: Payer: Self-pay

## 2024-09-12 ENCOUNTER — Ambulatory Visit

## 2024-09-12 ENCOUNTER — Ambulatory Visit (INDEPENDENT_AMBULATORY_CARE_PROVIDER_SITE_OTHER): Payer: Self-pay

## 2024-09-12 ENCOUNTER — Encounter (INDEPENDENT_AMBULATORY_CARE_PROVIDER_SITE_OTHER): Payer: Self-pay

## 2024-09-12 VITALS — BP 138/75 | HR 93 | Temp 99.3°F | Ht 64.0 in | Wt 174.4 lb

## 2024-09-12 DIAGNOSIS — M049 Autoinflammatory syndrome, unspecified: Secondary | ICD-10-CM

## 2024-09-12 DIAGNOSIS — R9089 Other abnormal findings on diagnostic imaging of central nervous system: Secondary | ICD-10-CM

## 2024-09-12 DIAGNOSIS — R202 Paresthesia of skin: Secondary | ICD-10-CM

## 2024-09-12 DIAGNOSIS — G500 Trigeminal neuralgia: Secondary | ICD-10-CM

## 2024-09-12 MED ORDER — PREDNISONE 10 MG TABLET: ORAL_TABLET | ORAL | 0 refills | Status: AC

## 2024-09-12 NOTE — Progress Notes (Signed)
 ASSESSMENT  TRIGEMINAL NEURALGIA:  She is a 68 year old woman who returns to clinic for over 2 years of progressive facial pain and numbness on the right side.  Symptoms started somewhat insidiously with frank numbness before becoming painful and tender to touch.  Her exam is notable for sensory deficits on the right side of the face. We did obtain an MRI and MRA of the head which were essentially unremarkable. Nerve conduction studies revealed decrease amplitudes of the right trigeminal nerve compared to the left. She has experienced significant response to Trileptal  but has required high doses. She still has intermittent breakthrough symptoms and is also on gabapentin . This again has been helpful but incomplete. TID dosing has improved this. There has been some concern of medication side effect leading to dizziness which is certainly possible, however, she is overall happy with the balance of side effects to pain management.     2.   CERVICAL SPINAL STENOSIS: She has a longer standing history of paresthesias in the arms and legs.  These can be painful at times.  Her exam was previously notable for mostly small fiber deficits in a patchy distribution in the lower extremities calling into question possible small fiber neuropathy which does have an association with fibromyalgia. EMG revealed no evidence of a large fiber process. The addition of trileptal  and gabapentin  has helped her symptoms. We then obtained MRI of the cervical spine which showed severe canal stenosis which may be driving her symptoms. Symptoms that should prompt urgent evaluation have been discussed with her. Due to the concerning progression of her other health issues, she would like to defer discussion with surgeon.     3. MULTIPLE CRANIAL NEUROPATHIES: In the past year there has been a gradual onset of concerning symptoms. She has frank binocular diplopia concerning for cranial nerve abnormality with difficulty particularly with down gaze and  slightly to accomodation. There appears to be no fatigability. She also previously had mild asymmetry of the right lower face which has progressed to nearly full paralysis of the right face, lagophthalmos and forehead weakness. Additionally, her tongue deviation persists with increased difficulty swallowing liquids and manipulating food in her mouth. She mentions that she has been using a straw more, but feels as though she is dehydrated. Most recent MRI brain and cervical spine and MRV were unrevealing. MG antibody and paraneoplastic panel was negative. She now has multiple clearly worsening cranial neuropathies with previous negative imaging and negative rheumatologic workup. 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 This does raise concern for possible leptomeningeal involvement. Conjunctival injection and discharge from the right eye still present, despite measures to protect from irritation (lubricating drops, taping). Unfortunately during her lumbar puncture, IR was unable to obtain enough fluid for appropriate testing though cell counts were normal. We discussed attempting the LP again, however, she would like to wait until she is seen by neuroimmunology. Given progression of ocular symptoms, orbit imaging was ordered to assess for orbital apex or Tolosa-hunt syndrome, however, she is delaying scheduling the scan due to financial constraints. Though this MRI would not explain hypoglossal nerve involvement. Serum work up for infectious etiologies were negative. Neuroimmunology in [place] is aware of her case and is scheduled to see her 09/26/2024 via telehealth because she struggles with driving longer distances. She is still awaiting opthalmology appointment.     PLAN  1. Continue Trileptal   900 mg BID      Continue gabapentin   800 TID   2.  Continue to monitor       If worsening, will refer to NSGY  3. Obtain Labs   Hepatitis A, B, and C  antibodies   Quantiferon   CT Chest, Abdomen, and Pelvis   Will discuss case with neuroimmunology   Steroid taper    - Counseled on side effects.     Return to clinic in 3 - 4 weeks    Thank you for allowing me to participate in your patient's care and please do not hesitate to contact me for any questions or concerns.    Jo Corona, APRN, CNP    [place]     I personally spent a total of 30 minutes today preparing to see the patient, in the encounter with the patient, and documenting after the visit.    I personally saw and evaluated the patient as part of a shared service with an APP.    My substantive findings are:  MDM (complete) 68 year old woman who follows up for progressively worsening multiple cranial neuropathies.  Workup has continued to be unremarkable.  Symptoms continue to worsen and exam today shows more significant right facial paralysis.  She is having difficulty swallowing at this point.  She has yet to see neural immunology.  She is averse to repeat attempt at a lumbar puncture and is having some financial constraints so has not undergone MRI orbits.  Given worsening, I do think an empiric trial of steroids is reasonable.  We will start her on a prolonged taper.  We discussed the risks including potential worsening of her underlying condition as we do not have a definitive diagnosis, however, I do believe we have ruled out many things that may not benefit or worsened with steroid treatment.  She is agreeable to get pan CT to evaluate for malignancy as well as hilar lymphadenopathy given concern for neurosarcoidosis.    [id number]: I will continue to be the provider focal point in managing the chronic complex neurological condition  ==========================================================================================================================================    NAME:  Jo Wong  DOB:  1955/10/07  VISIT DATE:  06/17/2022    CC:  Facial  Numbness    Patient seen in consultation at the request of Dr. Oneil Wong  History obtained from the patient and chart/records  Age of patient:  68 y.o.    INTERVAL:     09/12/2024  Since last visit, Jo Wong is reporting more difficulty drinking liquids and has started drinking through a straw more. She also reports pocketing food when she eats. She is feeling like she is getting dehydrated and fatigues easier. She has been taping the right eye most nights when she remembers. She denies any changes in vision from previous visit. She also denies any improvement in her overall state.     08/16/2024  Since Jo Wong was last seen, her eye has been bothering her more. She reports getting a film across her eye. She has been using eye drops more for hydration. She states that she is having discharge from the eye that looks like snot.  Continuing to have double vision. Eye has progressively gotten more red. Has been having progressively more pain in the eye.     She underwent a lumbar puncture unfortunately, IR was unable obtain enough fluid for appropriate testing    Since last visit, symptoms are largely unchanged. Still experiencing frequent diplopia. Right facial droop still quite prominent. She has not noted any new deficits since  last seen.     HPI:   I had the pleasure of seeing your patient in neurology clinic for an outpatient consultation, who is a 68 y.o. year old female who was referred for evaluation of facial numbness.  Please allow me to summarize the history for the record.     She states that symptoms began in November 2022.  It started with numbness that began under the right eye.  She did have a red spot in the same location at the time.  She does have a history of skin cancer and was given topical chemotherapeutic agent by her dermatologist to treat this.  Despite this, the numbness persisted and then began to extend down the right side of the face to the lip and laterally towards the ear.  Gradually it  became painful and tender to touch.  She reports the even light stimulus now can cause pain on the right side of her face.  She denies any weakness.  She does report that her right eye seems to have had a very mild decline in visual acuity.  She denies any changes in taste.  She does have a previous history of headaches though nothing with similar symptoms.  She also reports more longstanding numbness and tingling in the arms and legs.  She does report a history of fibromyalgia.  She does have some lightheadedness with standing.  She does have issues with constipation and diarrhea as she has a history of IBS.    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    Headache, post-traumatic    Thyroid  nodule    Thrombosis of mesenteric vein (CMS HCC)    Thromboembolic disorder (CMS HCC)    Fibromyositis    Multiple nodules of lung    IBS (irritable bowel syndrome)    Insomnia    Acquired hypothyroidism    Mixed hyperlipidemia    Hypertension    Major depressive disorder    Prolapsed cervical intervertebral disc    Cyst of ovary    Fibromyalgia    Generalized anxiety disorder    Actinic keratosis    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    OSA on CPAP    TMJ (dislocation of temporomandibular joint)    Chronic cough    Major depression, recurrent (CMS HCC)     Past Surgical History:   Procedure Laterality Date    BILATERAL OOPHORECTOMY      COLONOSCOPY  2017    Dr mal    HX CHOLECYSTECTOMY      HX LASER SKIN LESION REMOVAL      01/31/22    HX THYROIDECTOMY      HX TUBAL LIGATION      HX TUMOR REMOVAL  01/12/2024    throat    NECK SURGERY      mohs surgery x 3  under  chin  on left side    Chad Johnson  01/12/2024    SKIN GRAFT      12/2020  right upper arm    SKIN LESION EXCISION      removal of pigmented  skin lesion    SQUAMOUS CELL CARCINOMA EXCISION      10/2019  left shoulder and right front shoulder 03/2021  back right         Family Medical History:       Problem Relation (Age of  Onset)    Aortic Aneurysm Brother    Arthritis-rheumatoid Sister    Back Problems Brother    Blood Clots Mother    Breast Disease Sister    Cerebral Aneurysm Maternal Aunt    Deep vein thrombosis Sister    Heart Disease Mother    Hypertension (High Blood Pressure) Mother    Other Father    Primary Brain tumor Brother    Stroke Mother    Ulcers Brother            Current Outpatient Medications   Medication Sig Dispense Refill    albuterol  sulfate (PROVENTIL  HFA/VENTOLIN  HFA) 90 mcg Inhalation Inhaler Take 2 Puffs by inhalation Every 6 hours as needed 18 g 3    ARIPiprazole  (ABILIFY ) 5 mg Oral Tablet Take 1 Tablet (5 mg total) by mouth Once a day 90 Tablet 3    budesonide-glycopyr-formoterol (BREZTRI  AEROSPHERE) 160-9-4.8 mcg/actuation Inhalation HFA Aerosol Inhaler Take 2 Puffs by inhalation Twice daily 42.8 g 5    buPROPion  (WELLBUTRIN  XL) 300 mg extended release 24 hr tablet Take 1 Tablet (300 mg total) by mouth Once a day 90 Tablet 2    Cholecalciferol, Vitamin D3, 50 mcg (2,000 unit) Oral Capsule Take 1 Capsule by mouth Once a day      clotrimazole (MYCELEX) 10 mg Mucous Membrane Troche DISSOLVE 1 TABLET BY MOUTH 4 TIMES DAILY (AFTER MEALS AND AT BEDTIME)      famotidine  (PEPCID ) 40 mg Oral Tablet TAKE 1 TABLET EVERY EVENING 90 Tablet 3    gabapentin  (NEURONTIN ) 800 mg Oral Tablet Take 1 Tablet (800 mg total) by mouth Three times a day 270 Tablet 0    levothyroxine  (SYNTHROID) 50 mcg Oral Tablet TAKE 1 TABLET EVERY DAY 90 Tablet 3    losartan  (COZAAR ) 50 mg Oral Tablet TAKE 1 TABLET EVERY DAY 90 Tablet 3    meclizine  (ANTIVERT ) 12.5 mg Oral Tablet Take 1 Tablet (12.5 mg total) by mouth Every 12 hours as needed 30 Tablet 0    mecobalamin, vitamin B12, 1,000 mcg Oral Tablet, Chewable Chew 1 Tablet (1,000 mcg total) Once a day       milnacipran  (SAVELLA ) 100 mg Oral Tablet Take 1 Tablet (100 mg total) by mouth Twice daily (Patient taking differently: Take 1 Tablet (100 mg total) by mouth Once a day) 180 Tablet 2    montelukast (SINGULAIR) 10 mg Oral Tablet Take 1 Tablet (10 mg total) by mouth Every evening      nitroGLYCERIN (NITROSTAT) 0.4 mg Sublingual Tablet, Sublingual Place 1 Tablet (0.4 mg total) under the tongue Every 5 minutes as needed for Chest pain for 3 doses over 15 minutes      omega-3 fatty acid (LOVAZA) 1 gram Oral Capsule Take 1 Capsule (1 g total) by mouth Daily      OXcarbazepine  (TRILEPTAL ) 300 mg Oral Tablet TAKE 3 TABLETS BY MOUTH TWICE DAILY 540 Tablet 0    pantoprazole  (PROTONIX ) 40 mg Oral Tablet, Delayed Release (E.C.) TAKE 1 TABLET EVERY DAY 90 Tablet 3    pravastatin  (PRAVACHOL ) 40 mg Oral Tablet TAKE 1 TABLET EVERY EVENING 90 Tablet 3    predniSONE  (DELTASONE ) 10 mg Oral Tablet Take 4 Tablets (40 mg total) by mouth Daily for 14 days, THEN 3.5 Tablets (35 mg total) Daily for 14 days, THEN 3 Tablets (30 mg total) Daily for 14 days, THEN 2.5 Tablets (25 mg total) Daily for 14 days, THEN 2 Tablets (20 mg total) Daily for 14 days, THEN 1.5  Tablets (15 mg total) Daily for 14 days, THEN 1 Tablet (10 mg total) Daily for 14 days. 245 Tablet 0    Triamcinolone  Acetonide (NASACORT ) 55 mcg Nasal Aerosol, Spray Administer 2 Sprays into affected nostril(s) Once a day 16.9 mL 5     No current facility-administered medications for this visit.     Allergies   Allergen Reactions    Doxycycline  Other Adverse Reaction (Add comment)     Sweats and flushing    vomiting    Duloxetine  Other Adverse Reaction (Add comment)     Sweats, flushing, vomiting     Social History     Socioeconomic History    Marital status: Married     Spouse name: Not on file    Number of children: Not on file    Years of education: Not on file    Highest education level: Not on file   Occupational History    Not on file   Tobacco Use    Smoking status: Never     Smokeless tobacco: Never   Vaping Use    Vaping status: Never Used   Substance and Sexual Activity    Alcohol use: Never    Drug use: Never    Sexual activity: Not on file   Other Topics Concern    Not on file   Social History Narrative    Not on file     Social Determinants of Health     Financial Resource Strain: Low Risk (03/04/2022)    Financial Resource Strain     SDOH Financial: No   Transportation Needs: Low Risk (03/04/2022)    Transportation Needs     SDOH Transportation: No   Social Connections: Low Risk (03/04/2022)    Social Connections     SDOH Social Isolation: 5 or more times a week   Intimate Partner Violence: Low Risk (03/04/2022)    Intimate Partner Violence     SDOH Domestic Violence: No   Housing Stability: Low Risk (03/04/2022)    Housing Stability     SDOH Housing Situation: I have housing.     SDOH Housing Worry: No       ============================================================================================================================================  GENERAL EXAMINATION  BP 138/75 (Site: Left Arm, Patient Position: Sitting, Cuff Size: Adult)   Pulse 93   Temp 37.4 C (99.3 F) (Temporal)   Ht 1.626 m (5' 4)   Wt 79.1 kg (174 lb 6.4 oz)   SpO2 96%   BMI 29.94 kg/m     Vital signs personally reviewed  General: No acute distress, alert  HEENT: Normocephalic, injected conjunctiva with purulent drainage in right eye  Extremities: No significant edema, No cyanosis    NEUROLOGIC EXAM  On neurological exam, patient was awake, alert and answering questions appropriately  Speech was fluent, without dysarthria or aphasia.    CN  II:  Visual fields intact to confrontation  III, IV, VI: Reports frank horizontal diplopia with upgaze, downgaze and accommodation  V:  Diminished in all distributions to light touch on the right  VII: Near complete right facial paralysis  VIII: Grossly intact  IX, X: Unable to visualize palatal elevation due to tongue weakness  XI: Normal strength of trapezius and  sternocleidomastoid bilaterally  XII: Tongue deviates to the right    MOTOR  Bulk: normal  Abnormal Movements: none    Strength:     MRC Grading Scale   Right Left   Deltoid 5 5   Biceps 5 5  Triceps 5 5   Wrist Extension 5 5   Wrist Flexion 5 5   Finger Extension 5 5   Finger Abduction 5 5   Finger Flexion 5 5   Hip Flexion 5 5   Hip Extension - -   Hip Abduction - -   Hip Adduction - -   Knee Extension 5 5   Knee Flexion 5 5   Ankle Dorsiflexion - -   Ankle Plantarflexion - -   Toe Extension - -   Toe Flexion - -             REFLEXES   Right Left   Biceps 3 3   Triceps 2 2   Brachioradialis 2 2   Patellar 3 3   Achilles 1 1   Plantar - -   Hoffman Weak positive Strong Positive   Pectoralis - -   Jaw Jerk - -       SENSORY  Light touch: intact throughout all extremities    GAIT  General: casual, normal gait    ================================================================================================================================  LABS  Personal Review of prior labs is notable for:   2025  Serum ACE WNL  Lyme negative  HIV 1 & 2 negative  Serum MOG negative  Syphilis Antibody negative  Paraneoplastic panel negative  MG antibody panel negative  2024  CMP WNL   TSH normal  CBC largely WNL   2023  Hepatitis panel negative  Vitamin-D within normal limits   LDL 127   TSH within normal limits   CMP largely within normal limits   CBC largely within normal limits  SSA/B antibodies negative   Rheumatoid factor negative   Anti CCP antibody negative   ANA negative   SPEP within normal limits   B12 greater than 1500   A1c 4.9  IMAGING  Personal Review of imaging is notable for:    MRI Brain and Cervical Spine October 2025 without clear etiology for symptoms  MRV intracranial May 2025 - unremarkable  MRI Brain w/o contrast 07/17/2022:  Mild degree of atrophy likely in keeping with age otherwise essentially unremarkable study   MRA Head July 17, 2022:  No vascular compression of the trigeminal nerve on the  right  OTHER DIAGNOSTICS  Personal Review of other prior diagnostics is notable for:  Not applicable

## 2024-09-13 LAB — HEPATITIS C ANTIBODY SCREEN WITH REFLEX TO HCV PCR: HCV ANTIBODY QUALITATIVE: NEGATIVE

## 2024-09-13 LAB — HEPATITIS B CORE IGM, AB: HBV CORE IGM ANTIBODY QUALITATIVE: NEGATIVE

## 2024-09-13 LAB — HEPATITIS B SURFACE ANTIGEN: HBV SURFACE ANTIGEN QUALITATIVE: NEGATIVE

## 2024-09-13 LAB — HEPATITIS A (HAV) IGM ANTIBODY: HAV IGM: NEGATIVE

## 2024-09-13 NOTE — Nursing Note (Signed)
 Pt has insurance approval for her ct scans and is scheduled on January 2nd at 9:00 am in [place] for them. I spoke with that and she confirmed she would be at the appointment.

## 2024-09-14 LAB — QUANTIFERON
QFT MITOGEN: >10 [IU]/mL
QFT NIL: 0.0566 [IU]/mL
QFT QUALITATIVE: NEGATIVE
QFT TB1: 0.06 [IU]/mL
QFT TB2: 0.06 [IU]/mL

## 2024-09-14 LAB — QUANTIFERON TB2: QFT TB2: 0.06 [IU]/mL

## 2024-09-14 LAB — QUANTIFERON TB MITOGEN: QFT MITOGEN: >10 [IU]/mL

## 2024-09-14 LAB — QUANTIFERON TB1: QFT TB1: 0.06 [IU]/mL

## 2024-09-14 LAB — QUANTIFERON TB NIL: QFT NIL: 0.0566 [IU]/mL

## 2024-09-19 ENCOUNTER — Ambulatory Visit

## 2024-09-23 ENCOUNTER — Other Ambulatory Visit: Payer: Self-pay

## 2024-09-23 ENCOUNTER — Ambulatory Visit
Admission: RE | Admit: 2024-09-23 | Discharge: 2024-09-23 | Disposition: A | Discharge status: Home / Self Care | Source: Ambulatory Visit

## 2024-09-23 DIAGNOSIS — R9089 Other abnormal findings on diagnostic imaging of central nervous system: Secondary | ICD-10-CM | POA: Insufficient documentation

## 2024-09-23 DIAGNOSIS — M049 Autoinflammatory syndrome, unspecified: Secondary | ICD-10-CM | POA: Insufficient documentation

## 2024-09-23 MED ORDER — IOHEXOL 350 MG IODINE/ML INTRAVENOUS SOLUTION
100.0000 mL | INTRAVENOUS | Status: AC
  Administered 2024-09-23: 75 mL via INTRAVENOUS

## 2024-09-25 ENCOUNTER — Encounter (INDEPENDENT_AMBULATORY_CARE_PROVIDER_SITE_OTHER): Payer: Self-pay

## 2024-09-26 ENCOUNTER — Ambulatory Visit: Attending: Neurology | Admitting: Neurology

## 2024-09-26 DIAGNOSIS — Z8589 Personal history of malignant neoplasm of other organs and systems: Secondary | ICD-10-CM

## 2024-09-26 DIAGNOSIS — G529 Cranial nerve disorder, unspecified: Secondary | ICD-10-CM

## 2024-09-26 DIAGNOSIS — G629 Polyneuropathy, unspecified: Secondary | ICD-10-CM

## 2024-09-26 NOTE — Progress Notes (Signed)
 TELEMEDICINE DOCUMENTATION:    Patient Location:  MyChart video visit from home address: [address]  [place] [address]    Patient/family aware of provider location:  yes  Patient/family consent for telemedicine:  yes  Examination observed and performed by:  Andrea Sink, MD    [facility]      Date:  09/26/2024  Name: Jo Wong  Age:  69 y.o.  Referring Physician:   No referring provider defined for this encounter.    History of Present Illness:  History obtained from:  patient    Jo Wong is a 69 y.o. female who presents with a chief concern of   Chief Complaint   Patient presents with    Follow Up      -Initially trigeminal neuralgia 2.5 years ago  -Last 6-8 months developed right facial weakness. Initially also had swelling and redness.   -Seeing double x6 months  -Past 3 months - right eye always looks infected/red. Has been following with ophthalmology without clear cause. Has new ophtho appointment upcoming.  -Has been using eye drops for a few weeks  -No prior focal events  -Has fibromyalgia and arthritis, otherwise no history of autoimmune diseases  -CT chest with multiple pulmonary nodules  -Previously fell off of a step stool at work and landed on her back, has two bulging discs and a pinched nerve, otherwise no prior neurologic history  -No history of chemotherapy, had excisions and topical chemotherapy  -No benefit with steroids yet  -Sweats all the time, started in the past few weeks, no weight changes  -Fluctuating weakness  -Stress urinary incontinence     Pertinent Studies:  -Reviewed multiple prior labs and imaging studies in Epic. Agree brain MRI has several nonspecific white matter lesions but otherwise no clear etiology for her symptoms. C spine has degenerative changes/stenosis but also would not expect those to be causing her symptoms. CSF notable for protein >200. 10 WBC but had 161000 RBC.     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     Stomach cramps     Thromboembolic disorder (CMS HCC)     Thrombosis of mesenteric vein (CMS HCC)     Thyroid  nodule          Past Surgical History:   Procedure Laterality Date    BILATERAL OOPHORECTOMY      COLONOSCOPY  2017    Dr mal    HX CHOLECYSTECTOMY      HX LASER SKIN LESION REMOVAL      01/31/22    HX THYROIDECTOMY      HX TUBAL LIGATION      HX TUMOR REMOVAL  01/12/2024    throat    NECK SURGERY  mohs surgery x 3  under  chin  on left side    Chad Johnson  01/12/2024    SKIN GRAFT      12/2020  right upper arm    SKIN LESION EXCISION      removal of pigmented skin lesion    SQUAMOUS CELL CARCINOMA EXCISION      10/2019  left shoulder and right front shoulder 03/2021  back right         Current Outpatient Medications   Medication Sig    albuterol  sulfate (PROVENTIL  HFA/VENTOLIN  HFA) 90 mcg Inhalation Inhaler Take 2 Puffs by inhalation Every 6 hours as needed    ARIPiprazole  (ABILIFY ) 5 mg Oral Tablet Take 1 Tablet (5 mg total) by mouth Once a day    budesonide-glycopyr-formoterol (BREZTRI  AEROSPHERE) 160-9-4.8 mcg/actuation Inhalation HFA Aerosol Inhaler Take 2 Puffs by inhalation Twice daily    buPROPion  (WELLBUTRIN  XL) 300 mg extended release 24 hr tablet Take 1 Tablet (300 mg total) by mouth Once a day    Cholecalciferol, Vitamin D3, 50 mcg (2,000 unit) Oral Capsule Take 1 Capsule by mouth Once a day    clotrimazole (MYCELEX) 10 mg Mucous Membrane Troche DISSOLVE 1 TABLET BY MOUTH  4 TIMES DAILY (AFTER MEALS AND AT BEDTIME)    famotidine  (PEPCID ) 40 mg Oral Tablet TAKE 1 TABLET EVERY EVENING    gabapentin  (NEURONTIN ) 800 mg Oral Tablet Take 1 Tablet (800 mg total) by mouth Three times a day    levothyroxine  (SYNTHROID) 50 mcg Oral Tablet TAKE 1 TABLET EVERY DAY    losartan  (COZAAR ) 50 mg Oral Tablet TAKE 1 TABLET EVERY DAY    meclizine  (ANTIVERT ) 12.5 mg Oral Tablet Take 1 Tablet (12.5 mg total) by mouth Every 12 hours as needed    mecobalamin, vitamin B12, 1,000 mcg Oral Tablet, Chewable Chew 1 Tablet (1,000 mcg total) Once a day    milnacipran  (SAVELLA ) 100 mg Oral Tablet Take 1 Tablet (100 mg total) by mouth Twice daily (Patient taking differently: Take 1 Tablet (100 mg total) by mouth Once a day)    montelukast (SINGULAIR) 10 mg Oral Tablet Take 1 Tablet (10 mg total) by mouth Every evening    nitroGLYCERIN (NITROSTAT) 0.4 mg Sublingual Tablet, Sublingual Place 1 Tablet (0.4 mg total) under the tongue Every 5 minutes as needed for Chest pain for 3 doses over 15 minutes    omega-3 fatty acid (LOVAZA) 1 gram Oral Capsule Take 1 Capsule (1 g total) by mouth Daily    OXcarbazepine  (TRILEPTAL ) 300 mg Oral Tablet TAKE 3 TABLETS BY MOUTH TWICE DAILY    pantoprazole  (PROTONIX ) 40 mg Oral Tablet, Delayed Release (E.C.) TAKE 1 TABLET EVERY DAY    pravastatin  (PRAVACHOL ) 40 mg Oral Tablet TAKE 1 TABLET EVERY EVENING    predniSONE  (DELTASONE ) 10 mg Oral Tablet Take 4 Tablets (40 mg total) by mouth Daily for 14 days, THEN 3.5 Tablets (35 mg total) Daily for 14 days, THEN 3 Tablets (30 mg total) Daily for 14 days, THEN 2.5 Tablets (25 mg total) Daily for 14 days, THEN 2 Tablets (20 mg total) Daily for 14 days, THEN 1.5 Tablets (15 mg total) Daily for 14 days, THEN 1 Tablet (10 mg total) Daily for 14 days.    Triamcinolone  Acetonide (NASACORT ) 55 mcg Nasal Aerosol, Spray Administer 2 Sprays into affected nostril(s) Once a day   Not currently using Abilify   Allergies[1]  Family Medical History:        Problem Relation (Age of Onset)  Aortic Aneurysm Brother    Arthritis-rheumatoid Sister    Back Problems Brother    Blood Clots Mother    Breast Disease Sister    Cerebral Aneurysm Maternal Aunt    Deep vein thrombosis Sister    Heart Disease Mother    Hypertension (High Blood Pressure) Mother    Other Father    Primary Brain tumor Brother    Stroke Mother    Ulcers Brother            Social History     Socioeconomic History    Marital status: Married   Tobacco Use    Smoking status: Never    Smokeless tobacco: Never   Vaping Use    Vaping status: Never Used   Substance and Sexual Activity    Alcohol use: Never    Drug use: Never     Social Determinants of Health     Financial Resource Strain: Low Risk (03/04/2022)    Financial Resource Strain     SDOH Financial: No   Transportation Needs: Low Risk (03/04/2022)    Transportation Needs     SDOH Transportation: No   Social Connections: Low Risk (03/04/2022)    Social Connections     SDOH Social Isolation: 5 or more times a week   Intimate Partner Violence: Low Risk (03/04/2022)    Intimate Partner Violence     SDOH Domestic Violence: No   Housing Stability: Low Risk (03/04/2022)    Housing Stability     SDOH Housing Situation: I have housing.     SDOH Housing Worry: No   No smoking, no illicit drugs, no EtOH.    Review of Systems:  Other than above, no pertinent positives.    Examination:  Objective: (limited exam via tele)  General: Appears well, no distress.  Neurologic Exam:  Ophthalomscopic: Unable to evaluate over tele.  Mental status: Alert. Orientation, attention/concentration, language appear normal in conversation. Formal measures not tested today.  Cranial nerves: Eyes midline, face symmetric.  Muscle tone: Unable to test via tele.  Muscle exam: Moves bilateral UE against gravity.  Reflexes: Unable to test via tele.  Sensory: Unable to test via tele.  Coordination: Unable to fully assess via tele, no obvious tremor.  Gait: Did not ambulate during this  encounter.    Assessment and Plan:  Assessment/Plan   1. Cranial neuropathy    2. History of squamous cell carcinoma      Differential diagnosis includes malignancy (history of SCC, not enough fluid for cytology/flow cytometry on recent LP), inflammatory etiology such as neurosarcoidosis (no clear evidence on MRI but does have multiple pulmonary nodules on recent chest CT), infectious (less likely given time course and negative infectious labs thus far), or other inflammatory etiology. Has had a very thorough workup thus far. High protein in her CSF could suggest an inflammatory etiology although significant spine degenerative changes could also cause high protein. I recommend starting with repeat LP with cytology and flow cytometry and offered to arrange here. She is planning to get another opinion at [facility] and would like to do so before considering repeat LP. I would recommend biopsy of her pulmonary nodules to exclude systemic sarcoidosis or metastatic disease. She will follow up with her pulmonologist (Dr Shyrl in [place]). Agree with trial of steroids as prescribed by Dr. Sherre.     Andrea Sink, MD 09/26/2024, 11:20  Associate Professor, Department of Neurology         [1]  Allergies  Allergen Reactions    Doxycycline  Other Adverse Reaction (Add comment)     Sweats and flushing    vomiting    Duloxetine  Other Adverse Reaction (Add comment)     Sweats, flushing, vomiting

## 2024-09-27 ENCOUNTER — Other Ambulatory Visit: Payer: Self-pay

## 2024-10-04 ENCOUNTER — Other Ambulatory Visit: Payer: Self-pay

## 2024-10-05 ENCOUNTER — Encounter (INDEPENDENT_AMBULATORY_CARE_PROVIDER_SITE_OTHER): Payer: Self-pay

## 2024-10-05 ENCOUNTER — Ambulatory Visit: Attending: Internal Medicine

## 2024-10-05 ENCOUNTER — Other Ambulatory Visit: Payer: Self-pay

## 2024-10-05 ENCOUNTER — Ambulatory Visit (INDEPENDENT_AMBULATORY_CARE_PROVIDER_SITE_OTHER): Payer: Self-pay

## 2024-10-05 VITALS — BP 144/86 | HR 96 | Temp 100.4°F | Ht 64.0 in | Wt 177.0 lb

## 2024-10-05 DIAGNOSIS — G527 Disorders of multiple cranial nerves: Secondary | ICD-10-CM

## 2024-10-05 DIAGNOSIS — Z23 Encounter for immunization: Secondary | ICD-10-CM | POA: Insufficient documentation

## 2024-10-05 NOTE — Progress Notes (Signed)
 ASSESSMENT  TRIGEMINAL NEURALGIA: She is a 69 year old woman who returns to clinic for over 2 years of progressive facial pain and numbness on the right side. Symptoms started somewhat insidiously with frank numbness before becoming painful and tender to touch. Her exam is notable for sensory deficits on the right side of the face. MRI and MRA of the head which were essentially unremarkable. Nerve conduction studies revealed decrease amplitudes of the right trigeminal nerve compared to the left. She has experienced significant response to Trileptal  but has required high doses. She still has intermittent breakthrough symptoms and is also on gabapentin . This again has been helpful but incomplete. TID dosing has improved this. There has been some concern of medication side effect leading to dizziness which is certainly possible, however, she is overall happy with the balance of side effects to pain management.     CERVICAL SPINAL STENOSIS: She has a longer standing history of paresthesias in the arms and legs. These can be painful at times. Her exam was previously notable for mostly small fiber deficits in a patchy distribution in the lower extremities calling into question possible small fiber neuropathy which does have an association with fibromyalgia. EMG revealed no evidence of a large fiber process. The addition of trileptal  and gabapentin  has helped her symptoms. MRI of the cervical spine showed severe canal stenosis which may be driving her symptoms. Symptoms that should prompt urgent evaluation have been discussed with her. Due to the concerning progression of her other health issues, she would like to defer discussion with surgeon.     MULTIPLE CRANIAL NEUROPATHIES: In the past year there has been a gradual onset of concerning symptoms. She has frank binocular diplopia concerning for cranial nerve abnormality with difficulty particularly with down gaze and slightly to accomodation. There appears to be no  fatigability. She also previously had mild asymmetry of the right lower face which has progressed to nearly full paralysis of the right face, lagophthalmos and forehead weakness. Additionally, her tongue deviation persists with increased difficulty swallowing liquids and manipulating food in her mouth. She previously mentioned that she has been using a straw more, but feels as though she is still dehydrated. Most recent MRI brain and cervical spine and MRV were unrevealing. MG antibody and paraneoplastic panel was negative. TB, HEP panel, ACE, Lyme and HIV were also negative. She now has multiple clearly worsening cranial neuropathies with previous negative imaging and negative rheumatologic workup. 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 Conjunctival injection and discharge from the right eye continue to present, despite measures to protect from irritation (lubricating drops, taping). Unfortunately during her lumbar puncture, IR was unable to obtain enough fluid for appropriate testing though cell counts were normal. We discussed attempting the LP again, however, she would still like to wait until she is seen by neuroimmunology. Given progression of ocular symptoms, orbit imaging was ordered to assess for orbital apex or Tolosa-hunt syndrome, however, she is delaying scheduling the scan due to financial constraints. Though this MRI would not explain hypoglossal nerve involvement. Serum work up for infectious etiologies were negative.  She has also recently developed increasing dyspnea with exertion and increasing fatigue.  CT Chest, abdomen and pelvis was ordered due to concerns for malignancy and found multiple new nonspecific pulmonary nodules bilaterally compared to 2022 and new sclerotic changes involving the endplates at L1-L2 which are favored to be degenerative. With these findings,  malignancy can not be entirely excluded. She saw Dr. [name] with Neuroimmunology last week via telehealth who recommended biopsy of her pulmonary nodules which she would also like to defer until she sees neuro immunology at [facility]. She does have an appointment next week to see an eye specialist in [place]. The results of her CT, progression of her multiple cranial neuropathies, and constellation of other symptoms have become more concerning for sarcoidosis. He will reach out to [name] for referral.    PLAN  Continue Trileptal   900 mg BID  Continue gabapentin   800 TID   Continue to monitor  If worsening, will refer to NSGY  Refer to neuroimmunology in [facility]    Return to clinic for follow-up in 2 months    Thank you for allowing me to participate in your patient's care and please do not hesitate to contact me for any questions or concerns.    [name], APRN, CNP    [place]  [facility]     I personally spent a total of 30 minutes today preparing to see the patient, in the encounter with the patient, and documenting after the visit.    I personally saw and evaluated the patient as part of a shared service with an APP.    My substantive findings are:  MDM (complete) 69 year old woman who follows up for progressively worsening multiple cranial neuropathies.  Workup has continued to be unremarkable.  Symptoms continue to worsen and exam today shows more significant right facial paralysis.  She is having difficulty swallowing at this point.  She has yet to see neural immunology.  She is averse to repeat attempt at a lumbar puncture and is having some financial constraints so has not undergone MRI orbits.  Empiric trial of steroids has not resulted in significant improvement.  Pan CT did reveal multiple lung nodules. She has since seen Dr. [name] via telehealth who agrees that repeat LP is likely essential at this point. Could aso try for biopsy of one of the nodules  seen on CT. Patient would like to be seen at [facility] before attempting any of this. I have encouraged her to go the ED there to expedite her workup. 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     [id number]: I will continue to be the provider focal point in managing the chronic complex neurological condition  ==========================================================================================================================================    NAME:  Jo Wong  DOB:  1955/10/11  VISIT DATE:  10/05/2024    CC:  Facial Numbness    Patient seen in consultation at the request of Dr. Oneil Alvine  History obtained from the patient and chart/records  Age of patient:  69 y.o.    INTERVAL:     10/05/2024  Jo Wong is continuing to have trouble with her eye. She is scheduled to see an eye specialist in [facility] next Tuesday (10/11/2024).  Blurred vision in her right eye has been worsening. Her right eye pain is becoming more frequent.    She had telehealth visit with Dr. [name] [facility] neuro immunology) who recommended a biopsy of her lung nodules.  She also felt that the episodes of feeling hot in her head and neck radiating down her back for of some significance.    She is having increasing fatigue.  She feels better in the morning and worse in the evening.  She has been taking evening that the past couple of weeks.  She is also reporting increasing shortness of breath with  activity which was worse last week.      09/12/2024  Since last visit, Jo Wong is reporting more difficulty drinking liquids and has started drinking through a straw more. She also reports pocketing food when she eats. She is feeling like she is getting dehydrated and fatigues easier. She has been taping the right eye most nights when she remembers. She denies any changes in vision from previous visit. She also denies any improvement in her overall state.     08/16/2024  Since Jo Wong was last seen, her eye has been bothering her more. She reports getting a film  across her eye. She has been using eye drops more for hydration. She states that she is having discharge from the eye that looks like snot.  Continuing to have double vision. Eye has progressively gotten more red. Has been having progressively more pain in the eye.     She underwent a lumbar puncture unfortunately, IR was unable obtain enough fluid for appropriate testing    Since last visit, symptoms are largely unchanged. Still experiencing frequent diplopia. Right facial droop still quite prominent. She has not noted any new deficits since last seen.     HPI:   I had the pleasure of seeing your patient in neurology clinic for an outpatient consultation, who is a 69 y.o. year old female who was referred for evaluation of facial numbness.  Please allow me to summarize the history for the record.     She states that symptoms began in November 2022.  It started with numbness that began under the right eye.  She did have a red spot in the same location at the time.  She does have a history of skin cancer and was given topical chemotherapeutic agent by her dermatologist to treat this.  Despite this, the numbness persisted and then began to extend down the right side of the face to the lip and laterally towards the ear.  Gradually it became painful and tender to touch.  She reports the even light stimulus now can cause pain on the right side of her face.  She denies any weakness.  She does report that her right eye seems to have had a very mild decline in visual acuity.  She denies any changes in taste.  She does have a previous history of headaches though nothing with similar symptoms.  She also reports more longstanding numbness and tingling in the arms and legs.  She does report a history of fibromyalgia.  She does have some lightheadedness with standing.  She does have issues with constipation and diarrhea as she has a history of  IBS.    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    Headache, post-traumatic    Thyroid  nodule    Thrombosis of mesenteric vein (CMS HCC)    Thromboembolic disorder (CMS HCC)    Fibromyositis    Multiple nodules of lung    IBS (irritable bowel syndrome)    Insomnia    Acquired hypothyroidism    Mixed hyperlipidemia    Hypertension    Major depressive disorder    Prolapsed cervical intervertebral disc    Cyst of ovary    Fibromyalgia    Generalized anxiety disorder    Actinic keratosis    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    OSA on CPAP    TMJ (dislocation of temporomandibular  joint)    Chronic cough    Major depression, recurrent (CMS HCC)     Past Surgical History:   Procedure Laterality Date    BILATERAL OOPHORECTOMY      COLONOSCOPY  2017    Dr mal    HX CHOLECYSTECTOMY      HX LASER SKIN LESION REMOVAL      01/31/22    HX THYROIDECTOMY      HX TUBAL LIGATION      HX TUMOR REMOVAL  01/12/2024    throat    NECK SURGERY      mohs surgery x 3  under  chin  on left side    Chad Johnson  01/12/2024    SKIN GRAFT      12/2020  right upper arm    SKIN LESION EXCISION      removal of pigmented skin lesion    SQUAMOUS CELL CARCINOMA EXCISION      10/2019  left shoulder and right front shoulder 03/2021  back right         Family Medical History:       Problem Relation (Age of Onset)    Aortic Aneurysm Brother    Arthritis-rheumatoid Sister    Back Problems Brother    Blood Clots Mother    Breast Disease Sister    Cerebral Aneurysm Maternal Aunt    Deep vein thrombosis Sister    Heart Disease Mother    Hypertension (High Blood Pressure) Mother    Other Father    Primary Brain tumor Brother    Stroke Mother    Ulcers Brother            Current Outpatient Medications   Medication Sig Dispense Refill     albuterol  sulfate (PROVENTIL  HFA/VENTOLIN  HFA) 90 mcg Inhalation Inhaler Take 2 Puffs by inhalation Every 6 hours as needed 18 g 3    ARIPiprazole  (ABILIFY ) 5 mg Oral Tablet Take 1 Tablet (5 mg total) by mouth Once a day 90 Tablet 3    budesonide-glycopyr-formoterol (BREZTRI  AEROSPHERE) 160-9-4.8 mcg/actuation Inhalation HFA Aerosol Inhaler Take 2 Puffs by inhalation Twice daily 42.8 g 5    buPROPion  (WELLBUTRIN  XL) 300 mg extended release 24 hr tablet Take 1 Tablet (300 mg total) by mouth Once a day 90 Tablet 2    Cholecalciferol, Vitamin D3, 50 mcg (2,000 unit) Oral Capsule Take 1 Capsule by mouth Once a day      clotrimazole (MYCELEX) 10 mg Mucous Membrane Troche DISSOLVE 1 TABLET BY MOUTH 4 TIMES DAILY (AFTER MEALS AND AT BEDTIME)      famotidine  (PEPCID ) 40 mg Oral Tablet TAKE 1 TABLET EVERY EVENING 90 Tablet 3    gabapentin  (NEURONTIN ) 800 mg Oral Tablet Take 1 Tablet (800 mg total) by mouth Three times a day 270 Tablet 0    levothyroxine  (SYNTHROID) 50 mcg Oral Tablet TAKE 1 TABLET EVERY DAY 90 Tablet 3    losartan  (COZAAR ) 50 mg Oral Tablet TAKE 1 TABLET EVERY DAY 90 Tablet 3    meclizine  (ANTIVERT ) 12.5 mg Oral Tablet Take 1 Tablet (12.5 mg total) by mouth Every 12 hours as needed 30 Tablet 0    mecobalamin, vitamin B12, 1,000 mcg Oral Tablet, Chewable Chew 1 Tablet (1,000 mcg total) Once a day      milnacipran  (SAVELLA ) 100 mg Oral Tablet Take 1 Tablet (100 mg total) by mouth Twice daily (Patient taking differently: Take 1 Tablet (100 mg total) by mouth Once a day) 180 Tablet 2  montelukast (SINGULAIR) 10 mg Oral Tablet Take 1 Tablet (10 mg total) by mouth Every evening      nitroGLYCERIN (NITROSTAT) 0.4 mg Sublingual Tablet, Sublingual Place 1 Tablet (0.4 mg total) under the tongue Every 5 minutes as needed for Chest pain for 3 doses over 15 minutes      omega-3 fatty acid (LOVAZA) 1 gram Oral Capsule Take 1 Capsule (1 g total) by mouth Daily      OXcarbazepine  (TRILEPTAL ) 300 mg Oral Tablet TAKE 3  TABLETS BY MOUTH TWICE DAILY 540 Tablet 0    pantoprazole  (PROTONIX ) 40 mg Oral Tablet, Delayed Release (E.C.) TAKE 1 TABLET EVERY DAY 90 Tablet 3    pravastatin  (PRAVACHOL ) 40 mg Oral Tablet TAKE 1 TABLET EVERY EVENING 90 Tablet 3    predniSONE  (DELTASONE ) 10 mg Oral Tablet Take 4 Tablets (40 mg total) by mouth Daily for 14 days, THEN 3.5 Tablets (35 mg total) Daily for 14 days, THEN 3 Tablets (30 mg total) Daily for 14 days, THEN 2.5 Tablets (25 mg total) Daily for 14 days, THEN 2 Tablets (20 mg total) Daily for 14 days, THEN 1.5 Tablets (15 mg total) Daily for 14 days, THEN 1 Tablet (10 mg total) Daily for 14 days. 245 Tablet 0    Triamcinolone  Acetonide (NASACORT ) 55 mcg Nasal Aerosol, Spray Administer 2 Sprays into affected nostril(s) Once a day 16.9 mL 5     No current facility-administered medications for this visit.     Allergies   Allergen Reactions    Doxycycline  Other Adverse Reaction (Add comment)     Sweats and flushing    vomiting    Duloxetine  Other Adverse Reaction (Add comment)     Sweats, flushing, vomiting     Social History     Socioeconomic History    Marital status: Married     Spouse name: Not on file    Number of children: Not on file    Years of education: Not on file    Highest education level: Not on file   Occupational History    Not on file   Tobacco Use    Smoking status: Never    Smokeless tobacco: Never   Vaping Use    Vaping status: Never Used   Substance and Sexual Activity    Alcohol use: Never    Drug use: Never    Sexual activity: Not on file   Other Topics Concern    Not on file   Social History Narrative    Not on file     Social Determinants of Health     Financial Resource Strain: Low Risk (03/04/2022)    Financial Resource Strain     SDOH Financial: No   Transportation Needs: Low Risk (03/04/2022)    Transportation Needs     SDOH Transportation: No   Social Connections: Low Risk (03/04/2022)    Social Connections     SDOH Social Isolation: 5 or more times a week   Intimate  Partner Violence: Low Risk (03/04/2022)    Intimate Partner Violence     SDOH Domestic Violence: No   Housing Stability: Low Risk (03/04/2022)    Housing Stability     SDOH Housing Situation: I have housing.     SDOH Housing Worry: No       ============================================================================================================================================  GENERAL EXAMINATION  BP (!) 144/86 (Site: Left Arm, Patient Position: Sitting, Cuff Size: Adult)   Pulse 96   Temp 38 C (100.4 F) (Temporal)  Ht 1.626 m (5' 4)   Wt 80.3 kg (177 lb)   SpO2 96%   BMI 30.38 kg/m     Vital signs personally reviewed  General: No acute distress, alert  HEENT: Normocephalic, injected conjunctiva with purulent drainage in right eye  Extremities: No significant edema, No cyanosis    NEUROLOGIC EXAM  On neurological exam, patient was awake, alert and answering questions appropriately  Speech was fluent, without dysarthria or aphasia.    CN  II:  Visual fields intact to confrontation  III, IV, VI: Reports frank horizontal diplopia with upgaze, downgaze and accommodation  V:  Diminished in all distributions to light touch on the right  VII: Near complete right facial paralysis  VIII: Grossly intact  IX, X: Unable to visualize palatal elevation due to tongue weakness  XI: Normal strength of trapezius and sternocleidomastoid bilaterally  XII: Tongue deviates to the right    MOTOR  Bulk: normal  Abnormal Movements: none    Strength:     MRC Grading Scale   Right Left   Deltoid 5 5   Biceps 5 5   Triceps 5 5   Wrist Extension 5 5   Wrist Flexion 5 5   Finger Extension 5 5   Finger Abduction 5 5   Finger Flexion 5 5   Hip Flexion 5 5   Hip Extension - -   Hip Abduction - -   Hip Adduction - -   Knee Extension 5 5   Knee Flexion 5 5   Ankle Dorsiflexion - -   Ankle Plantarflexion - -   Toe Extension - -   Toe Flexion - -             REFLEXES   Right Left   Biceps 3 3   Triceps 2 2   Brachioradialis 2 2   Patellar 3  3   Achilles 1 1   Plantar - -   Hoffman Weak positive Strong Positive   Pectoralis - -   Jaw Jerk - -       SENSORY  Light touch: intact throughout all extremities    GAIT  General: casual, normal gait    ================================================================================================================================  LABS  Personal Review of prior labs is notable for:   2025  TB Quant negative  Hep A, B, C negative  Serum ACE WNL  Lyme negative  HIV 1 & 2 negative  Serum MOG negative  Syphilis Antibody negative  Paraneoplastic panel negative  MG antibody panel negative  2024  CMP WNL   TSH normal  CBC largely WNL   2023  Hepatitis panel negative  Vitamin-D within normal limits   LDL 127   TSH within normal limits   CMP largely within normal limits   CBC largely within normal limits  SSA/B antibodies negative   Rheumatoid factor negative   Anti CCP antibody negative   ANA negative   SPEP within normal limits   B12 greater than 1500   A1c 4.9  IMAGING  Personal Review of imaging is notable for:    CT Chest Abdomen and Pelvis W/ IV Contrast - 09/23/2024 - (Report Only)    MRI Brain and Cervical Spine October 2025 without clear etiology for symptoms  MRV intracranial May 2025 - unremarkable  MRI Brain w/o contrast 07/17/2022:  Mild degree of atrophy likely in keeping with age otherwise essentially unremarkable study   MRA Head July 17, 2022:  No vascular compression of the trigeminal nerve  on the right  OTHER DIAGNOSTICS  Personal Review of other prior diagnostics is notable for:  Not applicable

## 2024-10-06 ENCOUNTER — Encounter (INDEPENDENT_AMBULATORY_CARE_PROVIDER_SITE_OTHER): Payer: Self-pay

## 2024-10-06 NOTE — Progress Notes (Signed)
 FAXED REFERRED TO [facility]

## 2024-10-11 ENCOUNTER — Encounter (INDEPENDENT_AMBULATORY_CARE_PROVIDER_SITE_OTHER): Payer: Self-pay | Admitting: NEUROLOGY

## 2024-10-21 NOTE — Progress Notes (Signed)
 [facility]  Department of Neurology  Neuroimmunology Patient Evaluation      Patient Name: Jo Wong  Patient MRN:   [id number]      Initial HPI: Jo Wong is a 69 y.o. Caucasian female who presents for second opinion of possible sarcoidosis.    She has seen an optometrist and was told it is nothing related to the eye but due to the nerves. She has report of visit, OCT was not performed. She reports fundoscopy was done but not record on report that I can see.    She has a hx of migraines for several years but over the last few years with other symptoms they have worsened in severity, especially after eye drooping, it now causes stabbing pain behind her R eye.  She has conjunctival injection. It was present before facial drooping and would intermittently have this in both eye several years ago. It is worse now but is also having more difficulty with closing that eye from the CN7 palsy. Her vision in that eye started as diplopia and is now very foggy and sometimes can only see shadows.  She notes that the diplopia when looking on primary gaze is vertical but when looking left, its horizontal and when looking right, its vertical.  She reports some difficulty with swallowing where she feels like food gets stuck and has to cough it up. Denies choking and denies difficulty with liquids.  She also notes getting hot flashes over the past few months, not associated with an inciting factors. Has already gone through menopause several years ago. These episodes result in severe sweating, lasting few minutes.  She has had 3-4 episodes where she felt she couldn't breathe or cough. 1 episode resolved with drinking but also helps with calming herself down.    She also notes chronic cough for several years, better currently but has has exertional shortness of breath with walking, can only walk about 0.25 mile. 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 All of them were resected. Denies use of chemotherapy or radiation. Denies any focal weakness. She has had 3 falls in the past year from orthostasis.  She is currently on prednisone , started ~1 month ago with steroid taper starting at 40mg . Currently she is on 30mg . Has not noticed in change in symptoms when starting or with coming down.    Course:  2010: Clemens off a ladder backwards at work and has had low back pain and BL legs shooting/tingling sensation. Back pain worsened by bending over  2023: Developed R face trigeminal neuralgia  Spread from V2 to include V3 and up to top of head over 6-9 mos  03/2024: R eye drooping, double vision and noticed R facial drooping  Progressive worsening over time  04/2024: Difficulty with eating and drinking on R side d/t drooping of mouth  End of 2025: Noticed some urinary urgency. Has had stress induced incontinence    DMTs:  None    Sx Rx:  Trigeminal neuralgia; trileptal   Fibromyalgia/Back pain; gabapentin     Ambulation/Disability (record distance with/without assistance):  09/2024: Walking limited by shortness of breath ~1/4 mile    Imaging:  01/2024: MRV: negative  11/2023 MRI Brain: images and report reviewed, mild cerebral atrophy and chronic periventricular white matter disease.   06/2024: MRI Brain C/s: mild atrophy and nonspecific white matter lesions. On my read, there appears to possibly be R CN7 T2  hyperintensity and some slight enhancement. 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 with moderate R and severe L foraminal narrowing with associated myelopathic changes  09/2024: CT RJE:floupeoz BL pulm nodules, new from 2022, DDD    Labs:  05/2022: Ssa/Ssb neg  10/2023: Musk ab neg, Achr binding/blocking neg  07/2024: Mog, Lyme, HIV, ACE, ESR, RPR, TSH, TB quant, paraneoplastic panel neg; CRP 0.8 (H)  06/2024 CSF: WBC 10 (70% neutrophils), RBR 161,569, protein >200, glucose 67, 0 OCBs, IgG index 0.79 (H)        Review of Systems:  A complete  12 system ROS was performed (constitutional, eyes, ENMT, cardiovascular, respiratory, gastrointestinal, genitourinary, musculoskeletal, skin, neurologic, hematologic, psychiatric) and was negative aside from the pertinent positives and negatives noted in the HPI.    Physical Examination:  Neuro:  Mental Status:  Awake and alert; comprehension intact and following all commands  Oriented to self, place, time    Cranial Nerves:  Cranial nerves II: visual acuity was not tested and visual fields were intact in left eye. Injected conjunctiva on R  Cranial nerves III, IV, and VI: Mild R CN6 impairment. R eye lid retraction  Cranial nerves V: sensation to the face absent in R V1, diminished in V2, and normal in V3 & normal on L  Cranial nerves VII: full facial palsy including upper/lower R face  Cranial nerves VIII: hearing was intact  Cranial nerves IX and X: there was normal movement of the soft palate and normal gag  Cranial nerves XI: shoulder shrug was intact bilaterally  Cranial nerves XII: there was no tongue deviation with protrusion    Motor:  Upper  Extremity   DeltoidBicep    TricepWrist ExtWrist FlexFinger ExtFinger Flex  R 5 5 5 5 5      L 5 5 5 5 5          Lower Extremity   IP QuadHamsAnt TibGastroc  R 5 5 5 5 5    L 5 5 5 5 5        Tone:  Normal tone in both upper and lower extremities    Reflexes:        Bicep    Tricep BrachioradKneeAnkle Plantar    R 3 3 3 3 3     L 3 3 3 3 3           Sensory:  Light-touch sensation intact equally in bilateral upper and lower extremities. Vibration 5/8 in R big toe and knee, 6/8 in RUE, 6/8 in L big toe, knee, and LUE.    Cerebellar:  Involuntary Movements: None in upper or lower extremities  Tremor: No tremor in upper or lower extremity  Rapid-rhythmic alternating movements normal in both upper and lower extremities  F-N: Normal in both limbs  H-S: Normal in both limbs    Gait:  Normal stance and gait. 3-4 missteps with tandem.    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 CSF remarkable for elevated protein >  200 and mild elevation in IgG index; although protein could be explained by her spinal stenosis and high RBCs. The elevated index is concerning for an inflammatory etiology contributing to her symptoms such as neurosarcoidosis. Neoplasm/paraneoplastic is also on the differential especially given her history of extensive skin squamous cell carcinoma. Furthermore, the pulmonary nodules found on CT could go along with either of these diagnoses. She did not improve with steroids however it may be related to dose. Another unlikely possibility is that her trigeminal neuralgia is caused by the severe stenosis at C3-C4 impinging on the trigeminal spinal nucleus and the complete cranial nerve 7 palsy and her diplopia are unrelated. We discussed these potential diagnoses and next steps which seem to be in align with her local neurologist, Dr. Sherre. She would prefer to get additional workup done near her house so will defer that to Dr. Sherre but discussed getting a second opinion with one of [facility] ophthalmologists as well.      Plan:  -Repeat LP with CSF testing including flow cytometry, cytology, CSF TB testing, and [facility] paraneoplastic panel  -Will upload MR imaging with radiology read  -Referral to Dr. Maree with ophtho  -Additional labs ordered today to complete workup  -Discussed trying trial of high dose steroids after completing lumbar puncture  -RTC prn    The aforementioned diagnosis, management plans, and prognosis were extensively reviewed with the patient who  voiced understanding and agreed.      I have personally spent 75 minutes involved in face-to-face and non-face-to-face activities for this patient on the day of the visit.  Professional time spent includes the following activities, in addition to those noted in the documentation: Counseling and coordination of care.    Rosina Centers, MD  Neuroimmunology/MS  [facility]             [1]  No past medical history on file.  [2]  No past surgical history on file.  [3]  No family history on file.  [4]    Current Outpatient Medications:     ARIPIPRAZOLE  ORAL, Take by mouth daily., Disp: , Rfl:     ascorbic acid (VITAMIN C ORAL), Take by mouth daily., Disp: , Rfl:     benralizumab (Fasenra Pen) 30 mg/mL atIn auto-injector, Inject 30 mg under the skin., Disp: , Rfl:     budesonide-glycopyr-formoterol (Breztri  Aerosphere) 160-9-4.8 mcg/actuation inhaler, Inhale 2 puffs 2 (two) times a day., Disp: , Rfl:     buPROPion  (WELLBUTRIN  XL) 300 mg 24 hr tablet, Take 300 mg by mouth in the morning., Disp: , Rfl:     cholecalciferol (VITAMIN D3) 2,000 unit cap capsule, Take 2,000 Units by mouth daily., Disp: , Rfl:     erythromycin (ROMYCIN) 5 mg/gram (0.5 %) ophthalmic ointment, Apply to right eye 2 (two) times a day., Disp: , Rfl:     fluorometholone (FML LIQUIFILM) 0.1 % drps ophthalmic suspension, , Disp: , Rfl:     gabapentin  (NEURONTIN ) 800 mg tablet, Take 800 mg by mouth 3 (three) times a day., Disp: , Rfl:     levothyroxine  (SYNTHROID) 50 mcg tablet, Take 50 mcg by mouth daily., Disp: , Rfl:     losartan  (COZAAR ) 50 mg tablet, Take 50 mg by mouth daily., Disp: , Rfl:     mecobalamin, vitamin B12, 1,000 mcg chew, Chew 1,000 mcg daily., Disp: , Rfl:     milnacipran  (Savella ) 50 mg tablet, Take 50 mg by mouth 2 (two) times a day.,  Disp: , Rfl:     montelukast (SINGULAIR) 10 mg tablet, Take 10 mg by mouth every evening., Disp: , Rfl:     nitroglycerin (NITROSTAT) 0.4 mg SL tablet, Place 0.4 mg under  the tongue as needed., Disp: , Rfl:     omega-3 acid ethyl esters (LOVAZA) 1 gram capsule, Take 1 g by mouth daily., Disp: , Rfl:     OXcarbazepine  (TRILEPTAL ) 300 mg tablet, Take 900 mg by mouth 2 (two) times a day., Disp: , Rfl:     pantoprazole  (PROTONIX ) 40 mg EC tablet, Take 40 mg by mouth daily., Disp: , Rfl:     pravastatin  (PRAVACHOL ) 40 mg tablet, Take 40 mg by mouth every evening., Disp: , Rfl:     predniSONE  (DELTASONE ) 10 mg tablet, Take by mouth daily., Disp: , Rfl:

## 2024-10-24 ENCOUNTER — Other Ambulatory Visit (INDEPENDENT_AMBULATORY_CARE_PROVIDER_SITE_OTHER): Payer: Self-pay | Admitting: Internal Medicine

## 2024-10-24 MED ORDER — BUPROPION HCL XL 300 MG 24 HR TABLET, EXTENDED RELEASE: 300.0000 mg | ORAL_TABLET | Freq: Every day | ORAL | 2 refills | Status: AC

## 2024-10-25 ENCOUNTER — Other Ambulatory Visit (INDEPENDENT_AMBULATORY_CARE_PROVIDER_SITE_OTHER): Payer: Self-pay | Admitting: NEUROLOGY

## 2024-10-25 DIAGNOSIS — G527 Disorders of multiple cranial nerves: Secondary | ICD-10-CM

## 2024-12-01 ENCOUNTER — Encounter (INDEPENDENT_AMBULATORY_CARE_PROVIDER_SITE_OTHER): Payer: Self-pay

## 2024-12-22 ENCOUNTER — Ambulatory Visit (INDEPENDENT_AMBULATORY_CARE_PROVIDER_SITE_OTHER): Payer: Self-pay | Admitting: Internal Medicine

## 2025-04-24 ENCOUNTER — Ambulatory Visit (INDEPENDENT_AMBULATORY_CARE_PROVIDER_SITE_OTHER): Payer: Self-pay | Admitting: Internal Medicine
